# Patient Record
Sex: Male | Born: 1950 | Race: Black or African American | Hispanic: No | Marital: Married | Smoking: Never smoker
Health system: Southern US, Community
[De-identification: ages and names within clinical notes are randomized; demographics above are authoritative.]

## PROBLEM LIST (undated history)

## (undated) DIAGNOSIS — I2510 Atherosclerotic heart disease of native coronary artery without angina pectoris: Secondary | ICD-10-CM

## (undated) DIAGNOSIS — J189 Pneumonia, unspecified organism: Secondary | ICD-10-CM

## (undated) DIAGNOSIS — R209 Unspecified disturbances of skin sensation: Secondary | ICD-10-CM

## (undated) DIAGNOSIS — H5461 Unqualified visual loss, right eye, normal vision left eye: Secondary | ICD-10-CM

## (undated) DIAGNOSIS — D126 Benign neoplasm of colon, unspecified: Secondary | ICD-10-CM

## (undated) DIAGNOSIS — G459 Transient cerebral ischemic attack, unspecified: Secondary | ICD-10-CM

## (undated) DIAGNOSIS — M1990 Unspecified osteoarthritis, unspecified site: Secondary | ICD-10-CM

## (undated) DIAGNOSIS — G8929 Other chronic pain: Secondary | ICD-10-CM

## (undated) DIAGNOSIS — K219 Gastro-esophageal reflux disease without esophagitis: Secondary | ICD-10-CM

## (undated) DIAGNOSIS — E785 Hyperlipidemia, unspecified: Secondary | ICD-10-CM

## (undated) DIAGNOSIS — I639 Cerebral infarction, unspecified: Secondary | ICD-10-CM

## (undated) DIAGNOSIS — K5790 Diverticulosis of intestine, part unspecified, without perforation or abscess without bleeding: Secondary | ICD-10-CM

## (undated) DIAGNOSIS — I10 Essential (primary) hypertension: Secondary | ICD-10-CM

## (undated) DIAGNOSIS — E669 Obesity, unspecified: Secondary | ICD-10-CM

## (undated) HISTORY — DX: Cerebral infarction, unspecified: I63.9

## (undated) HISTORY — DX: Benign neoplasm of colon, unspecified: D12.6

## (undated) HISTORY — DX: Unspecified osteoarthritis, unspecified site: M19.90

## (undated) HISTORY — DX: Other chronic pain: G89.29

## (undated) HISTORY — DX: Diverticulosis of intestine, part unspecified, without perforation or abscess without bleeding: K57.90

## (undated) HISTORY — DX: Essential (primary) hypertension: I10

## (undated) HISTORY — PX: OTHER SURGICAL HISTORY: SHX169

## (undated) HISTORY — DX: Gastro-esophageal reflux disease without esophagitis: K21.9

## (undated) HISTORY — DX: Obesity, unspecified: E66.9

## (undated) HISTORY — DX: Hyperlipidemia, unspecified: E78.5

## (undated) HISTORY — PX: CARDIAC CATHETERIZATION: SHX172

## (undated) HISTORY — DX: Unqualified visual loss, right eye, normal vision left eye: H54.61

## (undated) HISTORY — DX: Transient cerebral ischemic attack, unspecified: G45.9

## (undated) HISTORY — DX: Unspecified disturbances of skin sensation: R20.9

---
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
## 1978-01-10 HISTORY — PX: OTHER SURGICAL HISTORY: SHX169

## 1991-11-11 DIAGNOSIS — D126 Benign neoplasm of colon, unspecified: Secondary | ICD-10-CM

## 1991-11-11 HISTORY — DX: Benign neoplasm of colon, unspecified: D12.6

## 1997-06-05 ENCOUNTER — Ambulatory Visit (HOSPITAL_COMMUNITY): Admission: RE | Admit: 1997-06-05 | Discharge: 1997-06-05 | Payer: Self-pay | Admitting: Podiatry

## 2001-08-20 ENCOUNTER — Encounter: Admission: RE | Admit: 2001-08-20 | Discharge: 2001-08-20 | Payer: Self-pay | Admitting: Orthopedic Surgery

## 2001-08-20 ENCOUNTER — Encounter: Payer: Self-pay | Admitting: Orthopedic Surgery

## 2003-03-05 ENCOUNTER — Inpatient Hospital Stay (HOSPITAL_COMMUNITY): Admission: RE | Admit: 2003-03-05 | Discharge: 2003-03-07 | Payer: Self-pay | Admitting: Internal Medicine

## 2004-04-01 ENCOUNTER — Ambulatory Visit: Payer: Self-pay | Admitting: Internal Medicine

## 2004-06-23 ENCOUNTER — Ambulatory Visit: Payer: Self-pay | Admitting: Gastroenterology

## 2004-07-01 ENCOUNTER — Ambulatory Visit: Payer: Self-pay | Admitting: Internal Medicine

## 2005-10-20 ENCOUNTER — Ambulatory Visit: Payer: Self-pay | Admitting: Gastroenterology

## 2005-10-28 ENCOUNTER — Ambulatory Visit: Payer: Self-pay | Admitting: Gastroenterology

## 2006-03-10 ENCOUNTER — Encounter: Admission: RE | Admit: 2006-03-10 | Discharge: 2006-03-10 | Payer: Self-pay | Admitting: Surgery

## 2006-03-18 ENCOUNTER — Encounter: Admission: RE | Admit: 2006-03-18 | Discharge: 2006-03-18 | Payer: Self-pay | Admitting: Surgery

## 2006-10-18 ENCOUNTER — Ambulatory Visit: Payer: Self-pay | Admitting: Gastroenterology

## 2006-10-25 ENCOUNTER — Encounter: Payer: Self-pay | Admitting: Gastroenterology

## 2006-10-25 ENCOUNTER — Ambulatory Visit: Payer: Self-pay | Admitting: Gastroenterology

## 2006-10-25 DIAGNOSIS — K5730 Diverticulosis of large intestine without perforation or abscess without bleeding: Secondary | ICD-10-CM | POA: Insufficient documentation

## 2006-10-25 DIAGNOSIS — K648 Other hemorrhoids: Secondary | ICD-10-CM | POA: Insufficient documentation

## 2007-03-22 DIAGNOSIS — I10 Essential (primary) hypertension: Secondary | ICD-10-CM | POA: Insufficient documentation

## 2007-03-22 DIAGNOSIS — E785 Hyperlipidemia, unspecified: Secondary | ICD-10-CM | POA: Insufficient documentation

## 2007-03-22 DIAGNOSIS — M109 Gout, unspecified: Secondary | ICD-10-CM

## 2007-03-22 DIAGNOSIS — K219 Gastro-esophageal reflux disease without esophagitis: Secondary | ICD-10-CM | POA: Insufficient documentation

## 2007-03-22 DIAGNOSIS — J310 Chronic rhinitis: Secondary | ICD-10-CM | POA: Insufficient documentation

## 2007-03-22 DIAGNOSIS — D126 Benign neoplasm of colon, unspecified: Secondary | ICD-10-CM

## 2007-03-22 DIAGNOSIS — K921 Melena: Secondary | ICD-10-CM | POA: Insufficient documentation

## 2007-03-22 DIAGNOSIS — H409 Unspecified glaucoma: Secondary | ICD-10-CM | POA: Insufficient documentation

## 2007-09-27 ENCOUNTER — Encounter: Payer: Self-pay | Admitting: Gastroenterology

## 2007-10-04 ENCOUNTER — Telehealth: Payer: Self-pay | Admitting: Gastroenterology

## 2007-10-11 ENCOUNTER — Encounter: Payer: Self-pay | Admitting: Gastroenterology

## 2009-01-10 DIAGNOSIS — R569 Unspecified convulsions: Secondary | ICD-10-CM

## 2009-01-10 DIAGNOSIS — I639 Cerebral infarction, unspecified: Secondary | ICD-10-CM

## 2009-01-10 HISTORY — DX: Unspecified convulsions: R56.9

## 2009-01-10 HISTORY — PX: POLYPECTOMY: SHX149

## 2009-01-10 HISTORY — DX: Cerebral infarction, unspecified: I63.9

## 2009-02-03 ENCOUNTER — Inpatient Hospital Stay (HOSPITAL_COMMUNITY): Admission: EM | Admit: 2009-02-03 | Discharge: 2009-02-05 | Payer: Self-pay | Admitting: Emergency Medicine

## 2009-02-03 ENCOUNTER — Ambulatory Visit: Payer: Self-pay | Admitting: Cardiology

## 2009-02-04 ENCOUNTER — Encounter (INDEPENDENT_AMBULATORY_CARE_PROVIDER_SITE_OTHER): Payer: Self-pay | Admitting: Emergency Medicine

## 2009-02-04 ENCOUNTER — Encounter (INDEPENDENT_AMBULATORY_CARE_PROVIDER_SITE_OTHER): Payer: Self-pay | Admitting: Internal Medicine

## 2009-02-04 ENCOUNTER — Ambulatory Visit: Payer: Self-pay | Admitting: Surgery

## 2010-03-29 LAB — BASIC METABOLIC PANEL
BUN: 10 mg/dL (ref 6–23)
Calcium: 9.1 mg/dL (ref 8.4–10.5)
Chloride: 109 mEq/L (ref 96–112)
Creatinine, Ser: 0.95 mg/dL (ref 0.4–1.5)
GFR calc Af Amer: >60 mL/min (ref >60)
GFR calc non Af Amer: >60 mL/min (ref >60)
Glucose, Bld: 152 mg/dL — ABNORMAL HIGH (ref 70–99)
Potassium: 4 mEq/L (ref 3.5–5.1)
Sodium: 142 mEq/L (ref 135–145)

## 2010-03-29 LAB — CK: Total CK: 88 U/L (ref 7–232)

## 2010-03-29 LAB — CBC
HCT: 39.3 % (ref 39.0–52.0)
Hemoglobin: 13.4 g/dL (ref 13.0–17.0)
MCHC: 34 g/dL (ref 30.0–36.0)
Platelets: 193 10*3/uL (ref 150–400)
RBC: 4.68 MIL/uL (ref 4.22–5.81)
RDW: 13.2 % (ref 11.5–15.5)
WBC: 6.9 10*3/uL (ref 4.0–10.5)

## 2010-03-29 LAB — PROTIME-INR
INR: 1.08 (ref 0.00–1.49)
Prothrombin Time: 13.9 seconds (ref 11.6–15.2)

## 2010-03-29 LAB — GLUCOSE, CAPILLARY
Glucose-Capillary: 145 mg/dL — ABNORMAL HIGH (ref 70–99)
Glucose-Capillary: 191 mg/dL — ABNORMAL HIGH (ref 70–99)
Glucose-Capillary: 203 mg/dL — ABNORMAL HIGH (ref 70–99)

## 2010-03-29 LAB — DIFFERENTIAL
Basophils Relative: 1 % (ref 0–1)
Eosinophils Relative: 2 % (ref 0–5)
Lymphs Abs: 2.6 10*3/uL (ref 0.7–4.0)
Monocytes Absolute: 0.5 10*3/uL (ref 0.1–1.0)
Neutro Abs: 3.5 10*3/uL (ref 1.7–7.7)

## 2010-03-29 LAB — COMPREHENSIVE METABOLIC PANEL
ALT: 22 U/L (ref 0–53)
AST: 21 U/L (ref 0–37)
Albumin: 3.9 g/dL (ref 3.5–5.2)
Chloride: 106 mEq/L (ref 96–112)
Potassium: 3.5 mEq/L (ref 3.5–5.1)

## 2010-03-29 LAB — TROPONIN I
Troponin I: 0.01 ng/mL (ref 0.00–0.06)
Troponin I: 0.01 ng/mL (ref 0.00–0.06)
Troponin I: 0.01 ng/mL (ref 0.00–0.06)

## 2010-03-29 LAB — CK TOTAL AND CKMB (NOT AT ARMC)
Relative Index: INVALID (ref 0.0–2.5)
Total CK: 97 U/L (ref 7–232)

## 2010-03-29 LAB — HEMOGLOBIN A1C: Hgb A1c MFr Bld: 8.4 % — ABNORMAL HIGH (ref 4.6–6.1)

## 2010-03-29 LAB — LIPID PANEL
HDL: 31 mg/dL — ABNORMAL LOW (ref >39)
Total CHOL/HDL Ratio: 5.2 RATIO

## 2010-03-29 LAB — ANA: Anti Nuclear Antibody(ANA): NEGATIVE

## 2010-03-29 LAB — RPR: RPR Ser Ql: NONREACTIVE

## 2010-05-25 NOTE — Assessment & Plan Note (Signed)
[facility]                         GASTROENTEROLOGY OFFICE NOTE   SAMMUEL, BLICK                  MRN:          [id number]  DATE:10/18/2006                            DOB:          19-Feb-1950    [name] is a 60 year old African-American male with the history of  GERD and adenomatous colon polyps.  He states his reflux symptoms have  remained under excellent control on Prevacid and antireflux measures.  He has no colorectal complaints and specifically denies any change in  bowel habits, change in stool caliber, melena, or hematochezia.  He  furthermore denies dysphagia, odynophagia, nausea or vomiting.  He is  due for surveillance colonoscopy.   CURRENT MEDICATIONS:  1. Prevacid 30 mg p.o. q.a.m.  2. Timolol 0.5% eye drops as directed.   MEDICATION ALLERGIES:  None known.   EXAM:  Obese, no acute distress.  Weight 265.  Blood pressure 122/78, pulse 72 and regular.  CHEST:  Clear to auscultation bilaterally.  CARDIAC:  Regular rate and rhythm without murmurs.  ABDOMEN:  Soft and nontender with normoactive bowel sounds, no palpable  organomegaly, masses or hernias.   ASSESSMENT AND PLAN:  1. Personal history of adenomatous colon polyps.  Risks, benefits, and      alternatives to colonoscopy, possible biopsy and possible      polypectomy were discussed with the patient.  He consents to      proceed.  This will be scheduled electively.  2. GERD.  Maintain standard antireflux measures and Prevacid 30 mg      p.o. q.a.m.  Endoscopy, performed in October 2007, was negative.  3. Return office visit in one year.     Venita Lick. Russella Dar, MD, [facility]  Electronically Signed    MTS/MedQ  DD: 10/18/2006  DT: 10/18/2006  Job #: [id number]

## 2010-05-28 NOTE — Discharge Summary (Signed)
NAME:  BARAA, TUBBS                        ACCOUNT NO.:  [id number]   MEDICAL RECORD NO.:  [id number]                   PATIENT TYPE:  INP   LOCATION:  0362                                 FACILITY:  [facility]   PHYSICIAN:  Charlaine Dalton. Sherene Sires, M.D. [facility]           DATE OF BIRTH:  09-01-1950   DATE OF ADMISSION:  03/05/2003  DATE OF DISCHARGE:  03/07/2003                                 DISCHARGE SUMMARY   DISCHARGE DIAGNOSES:  1. Gastroenteritis with dehydration.  2. Hypertension.  3. History of gastroesophageal reflux disease.  4. Hypokalemia.   LABORATORY DATA:  CK is 142, CK-MB is 0.6, troponin I is 0.02.  Sodium 138,  potassium 3, chloride 106, CO2 is 27, glucose 153, BUN 17, creatinine 1.3.  WBC is 5.6, hemoglobin 14.2, hematocrit 41.9, platelets are 241.   HOSPITAL COURSE BY DISCHARGE DIAGNOSIS:  Discharge Diagnosis 1.  DEHYDRATION/DIARRHEA AND GASTROENTERITIS.  Patient was admitted to [facility], treated with intravenous fluids and antiemetics and  antidiuretics.  He responded to treatment and is ready for discharge home by  March 07, 2003.   Discharge Diagnosis 2. HYPERTENSION.  His antihypertensives were held while  in the hospital, his blood pressure continued to rise, and he was well  hydrated and started back on his Avalide by discharge.   Discharge Diagnosis 3. GASTROESOPHAGEAL REFLUX DISEASE.  His Prevacid had  been changed to an H2 blocker while in the hospital due to diarrhea and now  since diarrhea has been resolved he is back on Prevacid one a day.   MEDICATIONS:  1. Avalide 150/12.5 one a day.  2. Timoptic eye drops as before.  3. Prevacid 30 mg once a day.   SPECIAL INSTRUCTIONS:  1. Diet:  Drink plenty of fluids, regular diet.  2. Call for any problems.  3. He will follow up with Dr. Sherene Sires on March 18, 2003 at 9:40 a.m.   DISPOSITION AND CONDITION ON DISCHARGE:  Improved.     Brett Canales Minor, A.C.N.P. [facility]                 Charlaine Dalton. Sherene Sires, M.D.  [facility]    SM/MEDQ  D:  03/07/2003  T:  03/07/2003  Job:  [id number]   cc:   Venita Lick. Russella Dar, M.D. [facility]

## 2010-05-28 NOTE — Assessment & Plan Note (Signed)
[facility]                           GASTROENTEROLOGY OFFICE NOTE   Andrew [name], Andrew [name]                  MRN:          [id number]  DATE:10/20/2005                            DOB:          03-27-1950    OFFICE EVALUATION:  This is a return office visit for GERD.  His symptoms have generally been  well controlled on Prevacid daily.  He recently ran out of medication, and  after about 2 weeks his symptoms recurred.  He has had no dysphagia,  odynophagia, nausea, vomiting, abdominal pain, melena or hematochezia.  His  last colonoscopy was in July of 2003.  He has not previously had upper  endoscopy.   MEDICATIONS:  Medications listed on the chart have been reviewed.   MEDICATION ALLERGIES:  None known.   PHYSICAL EXAMINATION:  GENERAL:  In no acute distress.  VITAL SIGNS:  Weight 272 pounds.  Blood pressure is 138/80, pulse 80 and  regular.  HEENT:  Anicteric sclerae.  Oropharynx clear.  CHEST:  Clear to auscultation bilaterally.  CARDIAC:  Regular rate and rhythm without murmurs appreciated.  ABDOMEN:  Soft and nontender with normoactive bowel sounds.  No palpable  organomegaly, masses or hernias.   ASSESSMENT AND PLAN:  1. Chronic gastroesophageal reflux disease.  Rule out Barrett's esophagus.      Renew Prevacid 30 mg p.o. q.a.m. and continue standard antireflux      measures.  Risks, benefits, and alternatives of upper endoscopy with      possible biopsy discussed with the patient, and he consents to proceed.      This will be scheduled electively.  2. Personal history of adenomatous colon polyps.  Recall colonoscopy      recommended for July of 2008.       Venita Lick. Russella Dar, MD, [facility]      MTS/MedQ  DD:  10/20/2005  DT:  10/21/2005  Job #:  [id number]

## 2010-05-28 NOTE — H&P (Signed)
NAME:  Andrew [name], Andrew [name]                        ACCOUNT NO.:  [id number]   MEDICAL RECORD NO.:  [id number]                   PATIENT TYPE:  INP   LOCATION:  0362                                 FACILITY:  [facility]   PHYSICIAN:  Charlaine Dalton. Sherene Sires, M.D. [facility]           DATE OF BIRTH:  1950-11-06   DATE OF ADMISSION:  03/05/2003  DATE OF DISCHARGE:                                HISTORY & PHYSICAL   CHIEF COMPLAINT:  Nausea, vomiting, and diarrhea.   HISTORY:  This is a 60 year old black male with morbid obesity complicated  by hypertension and degenerative arthritis as well as gouty arthritis whom I  have followed for primary care and generally is healthy. However, acutely  this morning he woke up with nausea, vomiting, diarrhea, and feeling achy  all over associated with chills, but  no documented fever.  He called the  office requesting something for vomiting and I had him come in. He looked  acutely ill with tachycardia, but otherwise normal vital signs and I  recommended admission to the hospital. Presently he is not able to keep  anything down including Gatorade. He was able to put some ice chips in his  mouth without vomiting and last vomited about 1:30 today (approximately two  hours before his exam). However, as noted he looks acutely ill. He denies  any chest pain, sore throat, significant headache, specific areas of  myalgias, arthralgia, abdominal pain, or unusual exposure. He did eat at  [facility] last night, a chicken sandwich, that was different from what his wife  ate. There have been no other family members or contacts that have been  sick, although his wife has had the flu. Her symptoms are all upper  respiratory in nature and not GI.   PAST MEDICAL HISTORY:  1. Morbid obesity with target weight of less than 197.  2. Hypertension with negative Cardiolite, June 12, 2002.  3. Clinical GERD previously evaluated by Dr. Russella Dar.  4. Colon polyps with most recent colonoscopy August 09, 2001, significant only     for diverticulosis.  5. Status post arthroscopy of the left knee for degenerative joint disease     in 1986.  6. Status post excision of the right eye following injury in 1961.  7. Glaucoma.  8. Intermittent rhinitis.   MEDICATIONS:  He is maintained on Avalide 150/12.5 mg one daily, Timoptic  eyedrops, and Prevacid 30 mg daily.   SOCIAL HISTORY:  He has never smoked. He denies any significant alcohol use.  He works as a Engineer, drilling.   FAMILY HISTORY:  Positive for diabetes in mother in her 12s and father is  still alive and well. He has no full siblings.   REVIEW OF SYSTEMS:  Taken in detail and essentially negative, except as  noted above.   PHYSICAL EXAMINATION:  GENERAL: This is an acutely appearing black male who  has appears much different than his baseline, leaning  over a trash can, but  not actually vomiting at the time of evaluation.  VITAL SIGNS: His blood pressure is 144/88, pulse rate 121.  HEENT: Slightly dry mucosa. Dentition is intact. Nasal turbinates are  normal. Ear canals are clear bilaterally.  NECK: Supple without cervical adenopathy or tenderness. Carotid upstrokes  are brisk without any bruits. No thyromegaly or tracheal deviation.  LUNGS: Clear bilaterally to auscultation and percussion.  HEART: Regular rate and rhythm without murmurs, rubs, or gallops present.  ABDOMEN: Obese, but otherwise soft and benign with no organomegaly, masses,  or tenderness. Bowel sounds are diminished.  EXTREMITIES: Warm without calf tenderness, clubbing, cyanosis, or edema.  Pedal pulses present bilaterally.  NEUROLOGIC: No focal deficits __________.  SKIN: Warm and dry.  MUSCULOSKELETAL: Unremarkable.   LABORATORY DATA:  Pending at the time of dictation except the EKG was  performed with sinus tachycardia with nonspecific ST-T wave changes.   IMPRESSION/PLAN:  1. This patient appears somewhat toxic and/or dehydrated with what otherwise      would appear to be a viral gastroenteritis versus food poisoning.  I     recommend hospitalization for IV fluid if he cannot keep anything down     and GI evaluation within 24 hours if not improving with simply IV fluid,     plus Phenergan.  2. Hypertension. Will withhold his antihypertensive treatment for now.  3. History of gastroesophageal reflux disease. Will also hold his Prevacid     since this may cause further GI upset and just treat him with IV Pepcid     for now.                                               Charlaine Dalton. Sherene Sires, M.D. [facility]    MBW/MEDQ  D:  03/05/2003  T:  03/05/2003  Job:  [id number]

## 2010-06-22 ENCOUNTER — Encounter: Payer: Self-pay | Admitting: Internal Medicine

## 2010-08-16 ENCOUNTER — Other Ambulatory Visit: Payer: Self-pay | Admitting: Neurosurgery

## 2010-08-16 DIAGNOSIS — I729 Aneurysm of unspecified site: Secondary | ICD-10-CM

## 2010-08-24 ENCOUNTER — Ambulatory Visit
Admission: RE | Admit: 2010-08-24 | Discharge: 2010-08-24 | Disposition: A | Discharge status: Home / Self Care | Payer: Federal, State, Local not specified - PPO | Source: Ambulatory Visit | Attending: Neurosurgery | Admitting: Neurosurgery

## 2010-08-24 DIAGNOSIS — I729 Aneurysm of unspecified site: Secondary | ICD-10-CM

## 2010-08-24 MED ORDER — GADOBENATE DIMEGLUMINE 529 MG/ML IV SOLN
20.0000 mL | Freq: Once | INTRAVENOUS | Status: AC | PRN
  Administered 2010-08-24: 20 mL via INTRAVENOUS

## 2010-10-14 ENCOUNTER — Encounter: Payer: Self-pay | Admitting: Internal Medicine

## 2010-12-21 ENCOUNTER — Ambulatory Visit (INDEPENDENT_AMBULATORY_CARE_PROVIDER_SITE_OTHER): Payer: Federal, State, Local not specified - PPO | Admitting: Emergency Medicine

## 2010-12-21 DIAGNOSIS — F339 Major depressive disorder, recurrent, unspecified: Secondary | ICD-10-CM

## 2010-12-21 DIAGNOSIS — I6789 Other cerebrovascular disease: Secondary | ICD-10-CM

## 2010-12-21 DIAGNOSIS — I10 Essential (primary) hypertension: Secondary | ICD-10-CM

## 2010-12-22 ENCOUNTER — Encounter: Payer: Self-pay | Admitting: Family Medicine

## 2010-12-22 ENCOUNTER — Encounter: Payer: Self-pay | Admitting: Internal Medicine

## 2010-12-22 DIAGNOSIS — I10 Essential (primary) hypertension: Secondary | ICD-10-CM

## 2010-12-22 DIAGNOSIS — E785 Hyperlipidemia, unspecified: Secondary | ICD-10-CM

## 2010-12-22 DIAGNOSIS — G459 Transient cerebral ischemic attack, unspecified: Secondary | ICD-10-CM

## 2010-12-22 DIAGNOSIS — E119 Type 2 diabetes mellitus without complications: Secondary | ICD-10-CM | POA: Insufficient documentation

## 2011-02-01 ENCOUNTER — Ambulatory Visit (INDEPENDENT_AMBULATORY_CARE_PROVIDER_SITE_OTHER): Payer: Federal, State, Local not specified - PPO | Admitting: Emergency Medicine

## 2011-02-01 DIAGNOSIS — E119 Type 2 diabetes mellitus without complications: Secondary | ICD-10-CM

## 2011-02-01 DIAGNOSIS — E781 Pure hyperglyceridemia: Secondary | ICD-10-CM

## 2011-02-07 ENCOUNTER — Encounter: Payer: Self-pay | Admitting: Internal Medicine

## 2011-02-07 ENCOUNTER — Ambulatory Visit (INDEPENDENT_AMBULATORY_CARE_PROVIDER_SITE_OTHER): Payer: Federal, State, Local not specified - PPO | Admitting: Internal Medicine

## 2011-02-07 DIAGNOSIS — R5381 Other malaise: Secondary | ICD-10-CM

## 2011-02-07 DIAGNOSIS — R634 Abnormal weight loss: Secondary | ICD-10-CM

## 2011-02-07 DIAGNOSIS — I10 Essential (primary) hypertension: Secondary | ICD-10-CM

## 2011-02-07 DIAGNOSIS — R5383 Other fatigue: Secondary | ICD-10-CM | POA: Insufficient documentation

## 2011-02-07 DIAGNOSIS — E785 Hyperlipidemia, unspecified: Secondary | ICD-10-CM

## 2011-02-07 DIAGNOSIS — G459 Transient cerebral ischemic attack, unspecified: Secondary | ICD-10-CM

## 2011-02-07 NOTE — Assessment & Plan Note (Signed)
Patient is often fatigued.  Feels weak.  Activity is limited  History of sleep apnea.   With vasc issues I would recomm a lexiscan myoview to rule out inducible ischemia.

## 2011-02-07 NOTE — Assessment & Plan Note (Signed)
Need to get records from S. Daub.

## 2011-02-07 NOTE — Assessment & Plan Note (Signed)
BP is increased.  He has not taken his meds today.  I would follow.  Has been 140/70 at home.

## 2011-02-07 NOTE — Assessment & Plan Note (Signed)
Will get records from [employer].

## 2011-02-07 NOTE — Assessment & Plan Note (Signed)
Patient gets full easily.  Denies symptoms of gastroparesis.  Note labs from S Daub's office.  (CBC and WESR normal).  Will needto be followed.
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
## 2011-02-07 NOTE — Patient Instructions (Signed)
Your physician has requested that you have a lexiscan myoview. For further information please visit [email or web address]. Please follow instruction sheet, as given. We will call you with results   

## 2011-02-10 ENCOUNTER — Encounter: Payer: Self-pay | Admitting: Internal Medicine

## 2011-02-17 ENCOUNTER — Ambulatory Visit (HOSPITAL_COMMUNITY): Payer: Federal, State, Local not specified - PPO | Attending: Cardiology | Admitting: Radiology

## 2011-02-17 VITALS — BP 177/99 | Ht 72.0 in | Wt 199.0 lb

## 2011-02-17 DIAGNOSIS — E785 Hyperlipidemia, unspecified: Secondary | ICD-10-CM | POA: Insufficient documentation

## 2011-02-17 DIAGNOSIS — Z8673 Personal history of transient ischemic attack (TIA), and cerebral infarction without residual deficits: Secondary | ICD-10-CM | POA: Insufficient documentation

## 2011-02-17 DIAGNOSIS — E119 Type 2 diabetes mellitus without complications: Secondary | ICD-10-CM

## 2011-02-17 DIAGNOSIS — R634 Abnormal weight loss: Secondary | ICD-10-CM | POA: Insufficient documentation

## 2011-02-17 DIAGNOSIS — R5381 Other malaise: Secondary | ICD-10-CM | POA: Insufficient documentation

## 2011-02-17 DIAGNOSIS — I10 Essential (primary) hypertension: Secondary | ICD-10-CM | POA: Insufficient documentation

## 2011-02-17 DIAGNOSIS — G459 Transient cerebral ischemic attack, unspecified: Secondary | ICD-10-CM

## 2011-02-17 DIAGNOSIS — Z8249 Family history of ischemic heart disease and other diseases of the circulatory system: Secondary | ICD-10-CM | POA: Insufficient documentation

## 2011-02-17 DIAGNOSIS — R9431 Abnormal electrocardiogram [ECG] [EKG]: Secondary | ICD-10-CM

## 2011-02-17 MED ORDER — TECHNETIUM TC 99M TETROFOSMIN IV KIT
33.0000 | PACK | Freq: Once | INTRAVENOUS | Status: AC | PRN
  Administered 2011-02-17: 33 via INTRAVENOUS

## 2011-02-17 MED ORDER — REGADENOSON 0.4 MG/5ML IV SOLN
0.4000 mg | Freq: Once | INTRAVENOUS | Status: AC
  Administered 2011-02-17: 0.4 mg via INTRAVENOUS

## 2011-02-17 MED ORDER — TECHNETIUM TC 99M TETROFOSMIN IV KIT
10.8000 | PACK | Freq: Once | INTRAVENOUS | Status: AC | PRN
  Administered 2011-02-17: 11 via INTRAVENOUS

## 2011-02-17 NOTE — Progress Notes (Signed)
[facility] SITE 3 NUCLEAR MED [address] [place] [address] [phone]  Cardiology Nuclear Med Study  Andrew [name] is a 61 y.o. male [id number] 08/27/1950   Nuclear Med Background Indication for Stress Test:  Evaluation for Ischemia History:  '04 XLK:GMWNUU, EF=62%; '11 Echo:EF=60-65% Cardiac Risk Factors: Family History - CAD, Hypertension, Lipids, NIDDM and TIA  Symptoms:  Fatigue and weight loss   Nuclear Pre-Procedure Caffeine/Decaff Intake:  None NPO After: 8:00pm   Lungs:  Clear.  O2 SAT 99% on RA IV 0.9% NS with Angio Cath:  20g  IV Site: R Antecubital x 1, tolerated well IV Started by:  Irean Hong, RN  Chest Size (in):  48 Cup Size: n/a  Height: 6' (1.829 m)  Weight:  199 lb (90.266 kg)  BMI:  Body mass index is 26.99 kg/(m^2). Tech Comments:  Held diabetic meds and coreg this am    Nuclear Med Study 1 or 2 day study: 1 day  Stress Test Type:  Lexiscan  Reading MD: Charlton Haws, MD  Order Authorizing Provider:  Dietrich Pates, MD  Resting Radionuclide: Technetium 57m Tetrofosmin  Resting Radionuclide Dose: 10.8 mCi   Stress Radionuclide:  Technetium 83m Tetrofosmin  Stress Radionuclide Dose: 33.0 mCi           Stress Protocol Rest HR: 62 Stress HR: 101  Rest BP: 177/99 Stress BP: 177/113  Exercise Time (min): n/a METS: n/a   Predicted Max HR: 161 bpm % Max HR: 62.73 bpm Rate Pressure Product: 72536   Dose of Adenosine (mg):  n/a Dose of Lexiscan: 0.4 mg  Dose of Atropine (mg): n/a Dose of Dobutamine: n/a mcg/kg/min (at max HR)  Stress Test Technologist: Smiley Houseman, CMA-N  Nuclear Technologist:  Doyne Keel, CNMT     Rest Procedure:  Myocardial perfusion imaging was performed at rest 45 minutes following the intravenous administration of Technetium 48m Tetrofosmin.  Rest ECG: Nonspecific ST-T wave changes.  Stress Procedure:  The patient received IV Lexiscan 0.4 mg over 15-seconds.  Technetium 73m Tetrofosmin injected  at 30-seconds.  There were more diffuse ST-T wave changes and a hypertensive response with Lexiscan.  Quantitative spect images were obtained after a 45 minute delay.  Stress ECG: No significant change from baseline ECG  QPS Raw Data Images:  Normal; no motion artifact; normal heart/lung ratio. Stress Images:  There is decreased uptake in the inferior wall. Rest Images:  Normal homogeneous uptake in all areas of the myocardium. Subtraction (SDS):  These findings are consistent with ischemia. Transient Ischemic Dilatation (Normal <1.22):  1.03 Lung/Heart Ratio (Normal <0.45):  0.23  Quantitative Gated Spect Images QGS EDV:  121 ml QGS ESV:  48 ml QGS cine images:  NL LV Function; NL Wall Motion QGS EF: 60%  Impression Exercise Capacity:  Lexiscan with no exercise. BP Response:  Normal blood pressure response. Clinical Symptoms:  There is dyspnea. ECG Impression:  No significant ST segment change suggestive of ischemia. Comparison with Prior Nuclear Study: No images to compare  Overall Impression:  Low risk stress nuclear study. Suggestion of small area of mild inferobasal ischemia.  SDS 5      Charlton Haws

## 2011-03-03 ENCOUNTER — Encounter: Payer: Self-pay | Admitting: *Deleted

## 2011-03-03 DIAGNOSIS — M1990 Unspecified osteoarthritis, unspecified site: Secondary | ICD-10-CM | POA: Insufficient documentation

## 2011-03-03 DIAGNOSIS — E785 Hyperlipidemia, unspecified: Secondary | ICD-10-CM | POA: Insufficient documentation

## 2011-03-03 DIAGNOSIS — I10 Essential (primary) hypertension: Secondary | ICD-10-CM | POA: Insufficient documentation

## 2011-03-15 ENCOUNTER — Ambulatory Visit (INDEPENDENT_AMBULATORY_CARE_PROVIDER_SITE_OTHER): Payer: Federal, State, Local not specified - PPO | Admitting: Emergency Medicine

## 2011-03-15 ENCOUNTER — Encounter: Payer: Self-pay | Admitting: Emergency Medicine

## 2011-03-15 DIAGNOSIS — E119 Type 2 diabetes mellitus without complications: Secondary | ICD-10-CM

## 2011-03-15 DIAGNOSIS — G4700 Insomnia, unspecified: Secondary | ICD-10-CM

## 2011-03-15 DIAGNOSIS — R634 Abnormal weight loss: Secondary | ICD-10-CM

## 2011-03-15 DIAGNOSIS — G479 Sleep disorder, unspecified: Secondary | ICD-10-CM

## 2011-03-15 NOTE — Progress Notes (Signed)
  Subjective:    Patient ID: Andrew [name], male    DOB: 29-Apr-1950, 61 y.o.   MRN: [id number]  HPI patient in for recheck. There's been concern over weight loss. He has been back home with his wife. He is doing better. He's tried some muscle no is not been exercising. He is looking forward to getting back to playing golf as some appear    Review of Systems patient describes a fluttering sensation in his upper abdomen and chest. He is recently undergone a cardiac evaluation by Dr. Tenny Craw. His scan was read as low risk however there was a small area of inferior basal decreased flow on his scan.     Objective:   Physical Exam physical exam reveals a patch over the right. His neck is supple chest clear heart regular rate no murmurs rubs or gallops abdomen is soft liver and spleen are not enlarged.        Assessment & Plan:  Patient is a diabetic with recurrent TIAs. He he overall is doing better since she moved back in with his wife. He's been trying to keep his weight where it is with muscle note. His weight is the same as it was last visit his sugar checked was 104. He is having trouble with insomnia so we'll try and have him take his Ambien a tablet if he wakes up in the early morning.

## 2011-03-17 ENCOUNTER — Other Ambulatory Visit: Payer: Self-pay | Admitting: Emergency Medicine

## 2011-04-18 ENCOUNTER — Other Ambulatory Visit: Payer: Self-pay | Admitting: Physician Assistant

## 2011-04-26 ENCOUNTER — Encounter: Payer: Self-pay | Admitting: Emergency Medicine

## 2011-04-26 ENCOUNTER — Ambulatory Visit (INDEPENDENT_AMBULATORY_CARE_PROVIDER_SITE_OTHER): Payer: Federal, State, Local not specified - PPO | Admitting: Emergency Medicine

## 2011-04-26 VITALS — BP 154/89 | HR 64 | Temp 97.1°F | Resp 16 | Ht 71.0 in | Wt 196.0 lb

## 2011-04-26 DIAGNOSIS — G459 Transient cerebral ischemic attack, unspecified: Secondary | ICD-10-CM

## 2011-04-26 DIAGNOSIS — Z8673 Personal history of transient ischemic attack (TIA), and cerebral infarction without residual deficits: Secondary | ICD-10-CM

## 2011-04-26 DIAGNOSIS — I10 Essential (primary) hypertension: Secondary | ICD-10-CM

## 2011-04-26 DIAGNOSIS — R634 Abnormal weight loss: Secondary | ICD-10-CM

## 2011-04-26 NOTE — Progress Notes (Signed)
  Subjective:    Patient ID: Andrew [name], male    DOB: 1950/03/06, 61 y.o.   MRN: [id number]  HPI patient enters to followup on his weight loss. We decided last visit that he would try muscle milk increased activity and see what his weight did. He has lost some muscle mass involving his shoulders but is not lost any weight since his last visit. His weight last visit was 196 and is now 196 today.    Review of Systems he has no new symptoms to report     Objective:   Physical Exam physical exam reveals a patch over his right eye. He has no cranial nerve signs. His chest is clear his heart is regular without murmurs.        Assessment & Plan:  Patient stable with his weight loss at present. Will see again in 3 months but no other testing at the present time. Working out with some weights starting with 5 pound weights he does not have to strain much see Dr Sandria Manly. in June and hopefully what will be released to drive at that time

## 2011-05-17 ENCOUNTER — Other Ambulatory Visit: Payer: Self-pay | Admitting: Physician Assistant

## 2011-05-17 ENCOUNTER — Other Ambulatory Visit: Payer: Self-pay

## 2011-06-17 ENCOUNTER — Other Ambulatory Visit: Payer: Self-pay | Admitting: Physician Assistant

## 2011-06-30 ENCOUNTER — Other Ambulatory Visit: Payer: Self-pay | Admitting: Emergency Medicine

## 2011-07-12 ENCOUNTER — Other Ambulatory Visit: Payer: Self-pay | Admitting: Physician Assistant

## 2011-07-12 ENCOUNTER — Other Ambulatory Visit: Payer: Self-pay | Admitting: Emergency Medicine

## 2011-08-02 ENCOUNTER — Ambulatory Visit (INDEPENDENT_AMBULATORY_CARE_PROVIDER_SITE_OTHER): Payer: Federal, State, Local not specified - PPO | Admitting: Emergency Medicine

## 2011-08-02 ENCOUNTER — Other Ambulatory Visit: Payer: Self-pay | Admitting: Physician Assistant

## 2011-08-02 VITALS — BP 168/94 | HR 63 | Temp 97.9°F | Resp 16 | Ht 71.5 in | Wt 198.0 lb

## 2011-08-02 DIAGNOSIS — E785 Hyperlipidemia, unspecified: Secondary | ICD-10-CM

## 2011-08-02 DIAGNOSIS — I6789 Other cerebrovascular disease: Secondary | ICD-10-CM

## 2011-08-02 DIAGNOSIS — I10 Essential (primary) hypertension: Secondary | ICD-10-CM

## 2011-08-02 DIAGNOSIS — E119 Type 2 diabetes mellitus without complications: Secondary | ICD-10-CM

## 2011-08-02 LAB — COMPREHENSIVE METABOLIC PANEL
AST: 10 U/L (ref 0–37)
Albumin: 4.3 g/dL (ref 3.5–5.2)
Alkaline Phosphatase: 68 U/L (ref 39–117)
BUN: 13 mg/dL (ref 6–23)
Potassium: 4 mEq/L (ref 3.5–5.3)
Sodium: 143 mEq/L (ref 135–145)
Total Protein: 7.1 g/dL (ref 6.0–8.3)

## 2011-08-02 LAB — LIPID PANEL
HDL: 38 mg/dL — ABNORMAL LOW (ref >39)
LDL Cholesterol: 50 mg/dL (ref 0–99)
VLDL: 9 mg/dL (ref 0–40)

## 2011-08-02 LAB — GLUCOSE, POCT (MANUAL RESULT ENTRY): POC Glucose: 140 mg/dl — AB (ref 70–99)

## 2011-08-02 NOTE — Progress Notes (Signed)
  Subjective:    Patient ID: Andrew [name], male    DOB: Nov 21, 1950, 61 y.o.   MRN: [id number]  HPI patient here to followup his diabetes. He has had a stroke which causes some numbness on the left side of his face which has been stable. He sees Dr. Emilio Math on regular basis. He has been released to have a car and drive whenever he desires . He has been taking all of his medications as instructed. Things at home are good. He's been doing some traveling with his family and has been playing golf on a regular basis.    Review of Systems     Objective:   Physical Exam patient has a patch over his right. Left eye exam is normal. Chest is clear heart regular rate no murmurs abdomen is soft nontender extremities are without edema. Repeat blood pressure checked was 130/80  Results for orders placed in visit on 08/02/11  GLUCOSE, POCT (MANUAL RESULT ENTRY)      Component Value Range   POC Glucose 140 (*) 70 - 99 mg/dl  POCT GLYCOSYLATED HEMOGLOBIN (HGB A1C)      Component Value Range   Hemoglobin A1C 5.9          Assessment & Plan:  Patient stable at present. Will not make any medication changes. We'll recheck in 3-4 months

## 2011-08-03 ENCOUNTER — Other Ambulatory Visit: Payer: Self-pay | Admitting: Physician Assistant

## 2011-08-04 ENCOUNTER — Encounter: Payer: Self-pay | Admitting: Emergency Medicine

## 2011-08-09 ENCOUNTER — Other Ambulatory Visit: Payer: Self-pay | Admitting: Physician Assistant

## 2011-08-13 ENCOUNTER — Other Ambulatory Visit: Payer: Self-pay | Admitting: Physician Assistant

## 2011-08-29 ENCOUNTER — Other Ambulatory Visit: Payer: Self-pay | Admitting: Emergency Medicine

## 2011-09-01 ENCOUNTER — Encounter: Payer: Self-pay | Admitting: Gastroenterology

## 2011-09-03 ENCOUNTER — Other Ambulatory Visit: Payer: Self-pay

## 2011-09-03 MED ORDER — CLONIDINE HCL 0.3 MG/24HR TD PTWK: 1.0000 | MEDICATED_PATCH | TRANSDERMAL | Status: DC

## 2011-09-06 ENCOUNTER — Other Ambulatory Visit: Payer: Self-pay | Admitting: Emergency Medicine

## 2011-09-10 ENCOUNTER — Other Ambulatory Visit: Payer: Self-pay | Admitting: Physician Assistant

## 2011-09-11 NOTE — Telephone Encounter (Signed)
Ok x 3 months

## 2011-09-20 NOTE — Progress Notes (Signed)
Completed prior auth over the phone for pt's Ambien 10 mg and received approval through 07/23/12. Faxed approval notice to pharm.

## 2011-09-24 ENCOUNTER — Other Ambulatory Visit: Payer: Self-pay

## 2011-09-24 MED ORDER — CARVEDILOL 25 MG PO TABS: 25.0000 mg | ORAL_TABLET | Freq: Two times a day (BID) | ORAL | Status: DC

## 2011-09-29 ENCOUNTER — Encounter: Payer: Self-pay | Admitting: Gastroenterology

## 2011-10-24 ENCOUNTER — Encounter: Payer: Self-pay | Admitting: Gastroenterology

## 2011-10-24 ENCOUNTER — Ambulatory Visit (INDEPENDENT_AMBULATORY_CARE_PROVIDER_SITE_OTHER): Payer: Federal, State, Local not specified - PPO | Admitting: Gastroenterology

## 2011-10-24 VITALS — BP 172/90 | HR 68 | Ht 71.0 in | Wt 202.2 lb

## 2011-10-24 DIAGNOSIS — Z86010 Personal history of colonic polyps: Secondary | ICD-10-CM

## 2011-10-24 NOTE — Progress Notes (Signed)
History of Present Illness: This is a 61 year old male with a history of adenomatous colon polyps., Initial diagnosis 1993 he did have small adenomatous colon polyps in 2008. He returns for consideration of surveillance colonoscopy. He is maintained on Plavix and aspirin for history of TIAs and is followed by Dr. Fayrene Fearing love. Patient states he has had TIAs every few months and he feels he had a TIA with left-sided facial numbness and left shoulder numbness at the end of September. Denies weight loss, abdominal pain, constipation, diarrhea, change in stool caliber, melena, hematochezia, nausea, vomiting, dysphagia, reflux symptoms, chest pain.  Review of Systems: Pertinent positive and negative review of systems were noted in the above HPI section. All other review of systems were otherwise negative.  Current Medications, Allergies, Past Medical History, Past Surgical History, Family History and Social History were reviewed in [employer] record.  Physical Exam: General: Well developed , well nourished, no acute distress Head: Normocephalic and atraumatic Eyes:  sclerae anicteric, EOMI Ears: Normal auditory acuity Mouth: No deformity or lesions Neck: Supple, no masses or thyromegaly Lungs: Clear throughout to auscultation Heart: Regular rate and rhythm; no murmurs, rubs or bruits Abdomen: Soft, non tender and non distended. No masses, hepatosplenomegaly or hernias noted. Normal Bowel sounds Rectal: deferred to colonoscopy  Musculoskeletal: Symmetrical with no gross deformities  Skin: No lesions on visible extremities Pulses:  Normal pulses noted Extremities: No clubbing, cyanosis, edema or deformities noted Neurological: Alert oriented x 4, grossly nonfocal Cervical Nodes:  No significant cervical adenopathy Inguinal Nodes: No significant inguinal adenopathy Psychological:  Alert and cooperative. Normal mood and affect  Assessment and Recommendations:  1. Personal  history of adenomatous colon polyps. Given his history of recurrent TIAs and possibly a TIA several weeks ago I do not feel it is a safe time to discontinue Plavix, even for a few days. We will therefore postpone consideration of colonoscopy for several months and he will have reevaluation with Dr. Avie Echevaria. Await further evaluation from Dr. Sandria Manly regarding a 5 day hold of Plavix and aspirin. I would presume that he should have a stable neurologic situation for 6-12 months before considering even for short-term hold of Plavix.

## 2011-10-24 NOTE — Patient Instructions (Addendum)
You will be due for a recall colonoscopy in 04/2012. We will send you a reminder in the mail when it gets closer to that time.  cc: Lesle Chris, MD        Avie Echevaria, MD

## 2011-10-26 ENCOUNTER — Other Ambulatory Visit: Payer: Self-pay | Admitting: Physician Assistant

## 2011-11-10 ENCOUNTER — Other Ambulatory Visit: Payer: Self-pay | Admitting: Emergency Medicine

## 2011-11-15 ENCOUNTER — Ambulatory Visit (INDEPENDENT_AMBULATORY_CARE_PROVIDER_SITE_OTHER): Payer: Federal, State, Local not specified - PPO | Admitting: Emergency Medicine

## 2011-11-15 ENCOUNTER — Encounter: Payer: Self-pay | Admitting: Emergency Medicine

## 2011-11-15 VITALS — BP 185/81 | HR 60 | Temp 98.3°F | Resp 16 | Ht 72.0 in | Wt 202.0 lb

## 2011-11-15 DIAGNOSIS — E119 Type 2 diabetes mellitus without complications: Secondary | ICD-10-CM

## 2011-11-15 DIAGNOSIS — G459 Transient cerebral ischemic attack, unspecified: Secondary | ICD-10-CM

## 2011-11-15 DIAGNOSIS — E782 Mixed hyperlipidemia: Secondary | ICD-10-CM

## 2011-11-15 DIAGNOSIS — E785 Hyperlipidemia, unspecified: Secondary | ICD-10-CM

## 2011-11-15 DIAGNOSIS — Z23 Encounter for immunization: Secondary | ICD-10-CM

## 2011-11-15 DIAGNOSIS — I10 Essential (primary) hypertension: Secondary | ICD-10-CM

## 2011-11-15 LAB — POCT GLYCOSYLATED HEMOGLOBIN (HGB A1C): Hemoglobin A1C: 5.7

## 2011-11-15 LAB — LIPID PANEL
LDL Cholesterol: 44 mg/dL (ref 0–99)
Total CHOL/HDL Ratio: 2.7 Ratio
VLDL: 9 mg/dL (ref 0–40)

## 2011-11-15 LAB — COMPREHENSIVE METABOLIC PANEL
ALT: 16 U/L (ref 0–53)
AST: 12 U/L (ref 0–37)
Alkaline Phosphatase: 85 U/L (ref 39–117)
Chloride: 109 mEq/L (ref 96–112)
Creat: 0.9 mg/dL (ref 0.50–1.35)
Total Bilirubin: 0.5 mg/dL (ref 0.3–1.2)

## 2011-11-15 LAB — GLUCOSE, POCT (MANUAL RESULT ENTRY): POC Glucose: 124 mg/dl — AB (ref 70–99)

## 2011-11-15 MED ORDER — CARVEDILOL 25 MG PO TABS: 25.0000 mg | ORAL_TABLET | Freq: Two times a day (BID) | ORAL | Status: DC

## 2011-11-15 MED ORDER — PANTOPRAZOLE SODIUM 40 MG PO TBEC: 40.0000 mg | DELAYED_RELEASE_TABLET | Freq: Every day | ORAL | Status: DC

## 2011-11-15 NOTE — Progress Notes (Signed)
  Subjective:    Patient ID: Andrew [name], male    DOB: August 16, 1950, 61 y.o.   MRN: [id number]  HPI patient is doing well he is back playing golf regular. He continues to have a numb sensation on the left side of his face. He denies any chest pain or shortness of breath. His weight is up 2 pounds so he has stopped  losing weight. Things at home are better not as stressful. He is living with his wife in his relationship with his daughter has improved. He has not had any medications her meds since last night but continues on his blood pressure and cholesterol and diabetes medicines.   Review of Systems     Objective:   Physical Exam alert gentleman who is not in any distress. Neck is supple. Chest is clear to both auscultation and percussion. Abdomen is soft. Liver spleen are not enlarged. Extremities are without edema.  Results for orders placed in visit on 11/15/11  GLUCOSE, POCT (MANUAL RESULT ENTRY)      Component Value Range   POC Glucose 124 (*) 70 - 99 mg/dl  IFOBT (OCCULT BLOOD)      Component Value Range   IFOBT Negative    POCT GLYCOSYLATED HEMOGLOBIN (HGB A1C)      Component Value Range   Hemoglobin A1C 5.7          Assessment & Plan:  Systolic blood pressures up today. It seems to be fairly labile. He's overall doing well. Hemoglobin A1c was 5.7. Blood pressure is not at goal. I have encouraged him to be sure he takes his blood pressure medication regularly and checks his blood pressure at home to be sure we get better control. Recheck in 3-4 months. He was given a flu shot today.Marland Kitchen

## 2011-11-25 ENCOUNTER — Other Ambulatory Visit: Payer: Self-pay | Admitting: Physician Assistant

## 2011-12-08 ENCOUNTER — Ambulatory Visit (INDEPENDENT_AMBULATORY_CARE_PROVIDER_SITE_OTHER): Payer: Federal, State, Local not specified - PPO | Admitting: Emergency Medicine

## 2011-12-08 VITALS — BP 195/97 | HR 74 | Temp 98.2°F | Resp 18 | Ht 72.0 in | Wt 202.4 lb

## 2011-12-08 DIAGNOSIS — R21 Rash and other nonspecific skin eruption: Secondary | ICD-10-CM

## 2011-12-08 DIAGNOSIS — J329 Chronic sinusitis, unspecified: Secondary | ICD-10-CM

## 2011-12-08 MED ORDER — MUPIROCIN 2 % EX OINT: TOPICAL_OINTMENT | CUTANEOUS | Status: DC

## 2011-12-08 MED ORDER — CLOTRIMAZOLE-BETAMETHASONE 1-0.05 % EX CREA: TOPICAL_CREAM | Freq: Two times a day (BID) | CUTANEOUS | Status: DC

## 2011-12-08 MED ORDER — AMOXICILLIN-POT CLAVULANATE 875-125 MG PO TABS: 1.0000 | ORAL_TABLET | Freq: Two times a day (BID) | ORAL | Status: DC

## 2011-12-08 NOTE — Progress Notes (Signed)
  Subjective:    Patient ID: Andrew [name], male    DOB: 11/10/1950, 61 y.o.   MRN: [id number]  HPI patient started feeling bad about 10 days ago. He started with what he thought was an upper respiratory infection. He subsequently has developed a greenish drainage from the nasopharynx. He's had a little bit of a wheeze in his chest but no real cough. He denies fever. He also has developed a irritated rash is present between his buttocks for which she has been using a and D. ointment with minimal success.    Review of Systems     Objective:   Physical Exam patient is alert and cooperative. 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 TMs are clear nose is congested with purulent on the left throat is clear. Chest is clear to auscultation and percussion. Examination of the buttocks reveals some mild redness between the buttocks        Assessment & Plan:  Will cover with Augmentin twice a day saline spray to nose. Mucinex   to take twice a day.    and will give him Lotrisone cream to apply between his buttocks twice a day. His blood pressure was up today and we discussed making sure he takes his blood pressure medications regular.

## 2011-12-08 NOTE — Patient Instructions (Signed)

## 2011-12-09 ENCOUNTER — Other Ambulatory Visit: Payer: Self-pay

## 2011-12-09 ENCOUNTER — Other Ambulatory Visit: Payer: Self-pay | Admitting: Physician Assistant

## 2011-12-09 MED ORDER — CLOPIDOGREL BISULFATE 75 MG PO TABS: 75.0000 mg | ORAL_TABLET | Freq: Every day | ORAL | Status: DC

## 2011-12-09 NOTE — Telephone Encounter (Signed)
Please move this documentation to a telephone note.  Please have the patient contact his cardiologist for his refill of his Plavix. This medication should be coming from them. It does require periodic review to assess if continued therapy is needed and that would be a decision cardiology would need to make not primary care.

## 2011-12-09 NOTE — Telephone Encounter (Signed)
Checked pt's chart and found we had referred him to [facility] in 01/2011. Called [facility] and asked them if they were planning to have pt f/up w/them. They advised me that pt had seen Dr Dietrich Pates and that they will call pt to schedule f/up and that pharm can send RF req to them for Plavix. Called pharmacy and advised them to send RF request to Dr Tenny Craw and notified pt that we have

## 2011-12-09 NOTE — Telephone Encounter (Signed)
Called pt and advised him to call cardiologist to have plavix refilled. Pt stated he was put on this med in 2011 in the hosp and he doesn't have a cardiologist other than the one Dr Cleta Alberts sent him to several months ago for a stress test. I advised pt I will check his chart to see if I can find the cardiologist he saw and call him back w/info. Pt also wants me to check to see if we can refill his Lipitor.

## 2011-12-26 ENCOUNTER — Other Ambulatory Visit: Payer: Self-pay | Admitting: Physician Assistant

## 2011-12-27 ENCOUNTER — Telehealth: Payer: Self-pay | Admitting: *Deleted

## 2011-12-27 ENCOUNTER — Other Ambulatory Visit: Payer: Self-pay | Admitting: Emergency Medicine

## 2011-12-27 MED ORDER — CLONIDINE HCL 0.3 MG/24HR TD PTWK: 1.0000 | MEDICATED_PATCH | TRANSDERMAL | Status: DC

## 2011-12-27 NOTE — Telephone Encounter (Signed)
Called patient to advise Ambien 10 mg prescription was faxed to [facility] @ [address];  Mr Strader's skin patches were escribed

## 2012-01-23 ENCOUNTER — Other Ambulatory Visit: Payer: Self-pay | Admitting: Physician Assistant

## 2012-01-23 ENCOUNTER — Other Ambulatory Visit: Payer: Self-pay | Admitting: Emergency Medicine

## 2012-01-31 ENCOUNTER — Telehealth: Payer: Self-pay

## 2012-01-31 MED ORDER — ATORVASTATIN CALCIUM 10 MG PO TABS: ORAL_TABLET | ORAL | Status: DC

## 2012-01-31 NOTE — Telephone Encounter (Signed)
Called him to advise this was sent in for him. He can not get it filled until Feb. They state it is too soon, he was to take 1/2 but was not aware and states he took one instead of the 1/2 tablet, and used his 1 mo supply too soon. I have called pharmacy to advise 1. sig was incorrect on the new Rx and to cancel and I have resubmitted  2. The pharmacy should be aware patient continued to take 1 tablet in error, when he should have been taking 1/2, they will resubmit to the insurance and if required they will get a quantity limit override form for [place].

## 2012-01-31 NOTE — Telephone Encounter (Signed)
PT TAKES LIPITOR AND WAS TOLD HIS INSURANCE WON'T COVER UNTIL February, HOWEVER HE IS TOTALLY OUT AND MAY NEED TO PAY OUT OF POCKET FOR SOME UNTIL THEN PLEASE CALL PT AT [phone]   [facility] ON [address]

## 2012-01-31 NOTE — Telephone Encounter (Signed)
Patient aware pharmacy is working on this, and so am I .

## 2012-02-08 ENCOUNTER — Other Ambulatory Visit: Payer: Self-pay | Admitting: Internal Medicine

## 2012-02-13 ENCOUNTER — Ambulatory Visit (INDEPENDENT_AMBULATORY_CARE_PROVIDER_SITE_OTHER): Payer: Federal, State, Local not specified - PPO | Admitting: Internal Medicine

## 2012-02-13 ENCOUNTER — Encounter: Payer: Self-pay | Admitting: Internal Medicine

## 2012-02-13 VITALS — BP 152/73 | HR 60 | Ht 72.0 in | Wt 208.0 lb

## 2012-02-13 DIAGNOSIS — I10 Essential (primary) hypertension: Secondary | ICD-10-CM
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
## 2012-02-13 NOTE — Patient Instructions (Addendum)
Your physician wants you to follow-up in: 1 year with Dr. Ross.  You will receive a reminder letter in the mail two months in advance. If you don't receive a letter, please call our office to schedule the follow-up appointment.  

## 2012-02-22 ENCOUNTER — Other Ambulatory Visit: Payer: Self-pay | Admitting: Emergency Medicine

## 2012-02-28 ENCOUNTER — Other Ambulatory Visit: Payer: Self-pay | Admitting: Physician Assistant

## 2012-02-28 ENCOUNTER — Other Ambulatory Visit: Payer: Self-pay | Admitting: *Deleted

## 2012-02-28 MED ORDER — METFORMIN HCL ER 500 MG PO TB24: 500.0000 mg | ORAL_TABLET | Freq: Two times a day (BID) | ORAL | Status: DC

## 2012-02-28 NOTE — Telephone Encounter (Signed)
Error

## 2012-03-06 ENCOUNTER — Encounter: Payer: Self-pay | Admitting: Emergency Medicine

## 2012-03-06 ENCOUNTER — Ambulatory Visit (INDEPENDENT_AMBULATORY_CARE_PROVIDER_SITE_OTHER): Payer: Federal, State, Local not specified - PPO | Admitting: Emergency Medicine

## 2012-03-06 ENCOUNTER — Other Ambulatory Visit: Payer: Self-pay | Admitting: Emergency Medicine

## 2012-03-06 ENCOUNTER — Other Ambulatory Visit: Payer: Self-pay | Admitting: Internal Medicine

## 2012-03-06 VITALS — BP 168/85 | HR 65 | Temp 97.8°F | Resp 16 | Ht 72.0 in | Wt 211.0 lb

## 2012-03-06 DIAGNOSIS — G459 Transient cerebral ischemic attack, unspecified: Secondary | ICD-10-CM

## 2012-03-06 DIAGNOSIS — E119 Type 2 diabetes mellitus without complications: Secondary | ICD-10-CM

## 2012-03-06 DIAGNOSIS — E785 Hyperlipidemia, unspecified: Secondary | ICD-10-CM

## 2012-03-06 DIAGNOSIS — I10 Essential (primary) hypertension: Secondary | ICD-10-CM

## 2012-03-06 DIAGNOSIS — Z23 Encounter for immunization: Secondary | ICD-10-CM

## 2012-03-06 LAB — COMPREHENSIVE METABOLIC PANEL
ALT: 9 U/L (ref 0–53)
BUN: 16 mg/dL (ref 6–23)
CO2: 27 mEq/L (ref 19–32)
Calcium: 9.3 mg/dL (ref 8.4–10.5)
Chloride: 107 mEq/L (ref 96–112)
Creat: 1.06 mg/dL (ref 0.50–1.35)

## 2012-03-06 LAB — CBC WITH DIFFERENTIAL/PLATELET
Eosinophils Absolute: 0.1 10*3/uL (ref 0.0–0.7)
Eosinophils Relative: 3 % (ref 0–5)
Lymphs Abs: 2.2 10*3/uL (ref 0.7–4.0)
MCH: 27.2 pg (ref 26.0–34.0)
MCV: 82.8 fL (ref 78.0–100.0)
Monocytes Absolute: 0.4 10*3/uL (ref 0.1–1.0)
Platelets: 184 10*3/uL (ref 150–400)
RBC: 4.64 MIL/uL (ref 4.22–5.81)

## 2012-03-06 LAB — LIPID PANEL
Cholesterol: 124 mg/dL (ref 0–200)
HDL: 39 mg/dL — ABNORMAL LOW (ref >39)
Total CHOL/HDL Ratio: 3.2 Ratio

## 2012-03-06 MED ORDER — OLMESARTAN MEDOXOMIL-HCTZ 40-12.5 MG PO TABS: 1.0000 | ORAL_TABLET | Freq: Every day | ORAL | Status: DC

## 2012-03-06 NOTE — Progress Notes (Signed)
  Subjective:    Patient ID: Andrew [name], male    DOB: June 21, 1950, 62 y.o.   MRN: [id number]  HPI problem #1 diabetes mellitus. His diabetes has been stable he continues on Glucophage diet and exercise. Problem #2 hypertension. He went to see Dr. Huston Foley and she recommended he change to Benicar HCTZ from plain Benicar. Problem #3 TIA he is a patient of Dr. Andee Lineman. I suggested He change to either Dr. Vickey Huger or Dr. Anne Hahn for care. Problem #4 hyperlipidemia. He continues on diet and statins for this problem.    Review of Systems     Objective:   Physical Exam there is a patch over his right eye. There are no carotid bruits heard. His chest is clear. Her neck exam reveals a regular rate without murmurs rubs or gallops the abdomen is soft without hepatosplenomegaly. Extremities are without edema.  Results for orders placed in visit on 03/06/12  POCT GLYCOSYLATED HEMOGLOBIN (HGB A1C)      Result Value Range   Hemoglobin A1C 6.0          Assessment & Plan:  Patient looks good at present. His blood pressure is 168/85 and will make the changes recommended by Dr. Tenny Craw. Check his hemoglobin A1c today as they have been under 6 . His value today is also 6. His blood pressure medicine was changed to Benicar/HCTZ 40/12.5. He was given a prescription for shingles vaccine. His next checkup will be for physical.

## 2012-03-07 LAB — IRON AND TIBC
%SAT: 26 % (ref 20–55)
TIBC: 310 ug/dL (ref 215–435)

## 2012-04-27 ENCOUNTER — Other Ambulatory Visit: Payer: Self-pay | Admitting: Physician Assistant

## 2012-06-05 ENCOUNTER — Ambulatory Visit (INDEPENDENT_AMBULATORY_CARE_PROVIDER_SITE_OTHER): Payer: Federal, State, Local not specified - PPO | Admitting: Emergency Medicine

## 2012-06-05 VITALS — BP 123/79 | HR 63 | Temp 96.9°F | Resp 16 | Ht 72.0 in | Wt 229.0 lb

## 2012-06-05 DIAGNOSIS — I10 Essential (primary) hypertension: Secondary | ICD-10-CM

## 2012-06-05 DIAGNOSIS — G459 Transient cerebral ischemic attack, unspecified: Secondary | ICD-10-CM

## 2012-06-05 DIAGNOSIS — E785 Hyperlipidemia, unspecified: Secondary | ICD-10-CM

## 2012-06-05 DIAGNOSIS — E119 Type 2 diabetes mellitus without complications: Secondary | ICD-10-CM

## 2012-06-05 DIAGNOSIS — Z0000 Encounter for general adult medical examination without abnormal findings: Secondary | ICD-10-CM

## 2012-06-05 LAB — POCT URINALYSIS DIPSTICK
Ketones, UA: NEGATIVE
Leukocytes, UA: NEGATIVE
Nitrite, UA: NEGATIVE
Protein, UA: NEGATIVE
Urobilinogen, UA: 0.2

## 2012-06-05 LAB — CBC WITH DIFFERENTIAL/PLATELET
Basophils Relative: 0 % (ref 0–1)
Eosinophils Absolute: 0.2 10*3/uL (ref 0.0–0.7)
MCH: 28.2 pg (ref 26.0–34.0)
MCHC: 35.6 g/dL (ref 30.0–36.0)
Neutrophils Relative %: 45 % (ref 43–77)
Platelets: 197 10*3/uL (ref 150–400)
RDW: 13.6 % (ref 11.5–15.5)

## 2012-06-05 LAB — IFOBT (OCCULT BLOOD): IFOBT: NEGATIVE

## 2012-06-05 LAB — GLUCOSE, POCT (MANUAL RESULT ENTRY): POC Glucose: 141 mg/dl — AB (ref 70–99)
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
## 2012-06-05 NOTE — Progress Notes (Deleted)
  Subjective:    Patient ID: Andrew [name], male    DOB: 01-23-1950, 62 y.o.   MRN: [id number]  HPI    Review of Systems     Objective:   Physical Exam        Assessment & Plan:

## 2012-06-06 ENCOUNTER — Telehealth: Payer: Self-pay | Admitting: Emergency Medicine

## 2012-06-06 LAB — COMPREHENSIVE METABOLIC PANEL
ALT: 16 U/L (ref 0–53)
Alkaline Phosphatase: 87 U/L (ref 39–117)
Creat: 0.87 mg/dL (ref 0.50–1.35)
Sodium: 141 mEq/L (ref 135–145)
Total Bilirubin: 0.5 mg/dL (ref 0.3–1.2)
Total Protein: 7.9 g/dL (ref 6.0–8.3)

## 2012-06-06 LAB — LIPID PANEL
LDL Cholesterol: 73 mg/dL (ref 0–99)
Total CHOL/HDL Ratio: 3.6 Ratio
Triglycerides: 148 mg/dL (ref <150)
VLDL: 30 mg/dL (ref 0–40)

## 2012-06-06 NOTE — Telephone Encounter (Signed)
Patient advised.

## 2012-06-06 NOTE — Telephone Encounter (Signed)
Labs are good. No change in meds

## 2012-07-02 ENCOUNTER — Encounter: Payer: Self-pay | Admitting: Neurology

## 2012-07-03 ENCOUNTER — Encounter: Payer: Self-pay | Admitting: Neurology

## 2012-07-03 ENCOUNTER — Ambulatory Visit (INDEPENDENT_AMBULATORY_CARE_PROVIDER_SITE_OTHER): Payer: Federal, State, Local not specified - PPO | Admitting: Neurology

## 2012-07-03 VITALS — BP 151/83 | HR 64 | Temp 98.4°F | Ht 71.5 in | Wt 236.0 lb

## 2012-07-03 DIAGNOSIS — R569 Unspecified convulsions: Secondary | ICD-10-CM

## 2012-07-03 DIAGNOSIS — R209 Unspecified disturbances of skin sensation: Secondary | ICD-10-CM

## 2012-07-03 HISTORY — DX: Unspecified disturbances of skin sensation: R20.9

## 2012-07-03 MED ORDER — CARBAMAZEPINE 200 MG PO TABS: 200.0000 mg | ORAL_TABLET | ORAL | Status: DC

## 2012-07-03 NOTE — Patient Instructions (Signed)
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
## 2012-07-04 ENCOUNTER — Other Ambulatory Visit: Payer: Self-pay | Admitting: Internal Medicine

## 2012-08-02 ENCOUNTER — Other Ambulatory Visit: Payer: Self-pay | Admitting: Internal Medicine

## 2012-08-15 ENCOUNTER — Telehealth: Payer: Self-pay

## 2012-08-16 NOTE — Telephone Encounter (Signed)
PA approved for protonix 40 mg through 08/14/13. Notified pharm.

## 2012-08-20 DIAGNOSIS — Z0271 Encounter for disability determination: Secondary | ICD-10-CM

## 2012-08-27 ENCOUNTER — Ambulatory Visit (INDEPENDENT_AMBULATORY_CARE_PROVIDER_SITE_OTHER): Payer: Federal, State, Local not specified - PPO | Admitting: Family Medicine

## 2012-08-27 VITALS — BP 132/72 | HR 75 | Temp 98.7°F | Resp 18 | Ht 73.5 in | Wt 236.0 lb

## 2012-08-27 DIAGNOSIS — G40909 Epilepsy, unspecified, not intractable, without status epilepticus: Secondary | ICD-10-CM | POA: Insufficient documentation

## 2012-08-27 DIAGNOSIS — J209 Acute bronchitis, unspecified: Secondary | ICD-10-CM

## 2012-08-27 MED ORDER — DOXYCYCLINE HYCLATE 100 MG PO TABS: 100.0000 mg | ORAL_TABLET | Freq: Two times a day (BID) | ORAL | Status: DC

## 2012-08-27 NOTE — Patient Instructions (Addendum)
Use the doxycycline antibiotic for your bronchitis.  Take it twice a day with food and water.  Please let me know if you are not feeling better in the next few days.
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
## 2012-09-20 ENCOUNTER — Other Ambulatory Visit: Payer: Self-pay | Admitting: Physician Assistant

## 2012-10-02 ENCOUNTER — Telehealth: Payer: Self-pay

## 2012-10-02 ENCOUNTER — Ambulatory Visit (INDEPENDENT_AMBULATORY_CARE_PROVIDER_SITE_OTHER): Payer: Federal, State, Local not specified - PPO | Admitting: Emergency Medicine

## 2012-10-02 VITALS — BP 127/80 | HR 63 | Temp 97.5°F | Resp 16 | Ht 72.0 in | Wt 242.0 lb

## 2012-10-02 DIAGNOSIS — R030 Elevated blood-pressure reading, without diagnosis of hypertension: Secondary | ICD-10-CM

## 2012-10-02 DIAGNOSIS — I639 Cerebral infarction, unspecified: Secondary | ICD-10-CM

## 2012-10-02 DIAGNOSIS — Z23 Encounter for immunization: Secondary | ICD-10-CM

## 2012-10-02 DIAGNOSIS — E119 Type 2 diabetes mellitus without complications: Secondary | ICD-10-CM

## 2012-10-02 DIAGNOSIS — I6350 Cerebral infarction due to unspecified occlusion or stenosis of unspecified cerebral artery: Secondary | ICD-10-CM

## 2012-10-02 LAB — POCT GLYCOSYLATED HEMOGLOBIN (HGB A1C): Hemoglobin A1C: 6.9

## 2012-10-02 NOTE — Progress Notes (Signed)
  Subjective:    Patient ID: Andrew [name], male    DOB: [birth year], 62 y.o.   MRN: [id number]  HPI patient in for followup of TIAs. When last seen by Dr. Dyanne Iha she felt he also had a seizure disorder and started medications for this. Patient states he has felt better since that visit. He continues to have intermittent facial numbness. He continues to feel weak at times in his extremities and does have dizziness with changes in position. He overall has been feeling well no specific chest pain or respiratory symptoms. He has been able to play golf. Patient has put on some weight recently he has not been as physically active as previously the    Review of Systems     Objective:   Physical Exam There is a patch over his right. There are no carotid bruits heard. His chest was clear his heart was regular rate no murmurs abdomen soft nontender.  Results for orders placed in visit on 10/02/12  GLUCOSE, POCT (MANUAL RESULT ENTRY)      Result Value Range   POC Glucose 178 (*) 70 - 99 mg/dl  POCT GLYCOSYLATED HEMOGLOBIN (HGB A1C)      Result Value Range   Hemoglobin A1C 6.9         Results for orders placed in visit on 10/02/12  GLUCOSE, POCT (MANUAL RESULT ENTRY)      Result Value Range   POC Glucose 178 (*) 70 - 99 mg/dl  POCT GLYCOSYLATED HEMOGLOBIN (HGB A1C)      Result Value Range   Hemoglobin A1C 6.9     Assessment & Plan:  Diabetes is not under as good control as previous. I do think that is related to his weight gain. I do not feel medication change was indicated. He is to continue his current medications plan recheck in 3-4 months. Disability papers were also signed

## 2012-10-02 NOTE — Telephone Encounter (Signed)
[employer] Disability paperwork is signed by Dr. Cleta Alberts and in Maudia's box to be completed.

## 2012-10-24 ENCOUNTER — Telehealth: Payer: Self-pay | Admitting: Radiology

## 2012-10-24 NOTE — Telephone Encounter (Signed)
I have gotten notice from you to call patient about his Carvedilol 25 mg he has not filled it, per [employer], but upon review I think this may have been d/c because of dizziness,but I can not find any documentation, please advise.

## 2012-10-25 MED ORDER — METFORMIN HCL ER 500 MG PO TB24: 1000.0000 mg | ORAL_TABLET | Freq: Every day | ORAL | Status: DC

## 2012-10-25 MED ORDER — CARVEDILOL 25 MG PO TABS: 25.0000 mg | ORAL_TABLET | Freq: Two times a day (BID) | ORAL | Status: DC

## 2012-10-25 NOTE — Telephone Encounter (Signed)
Andrew [name] and just asked him if he is currently taking the carvedilol. His blood pressure is at goal at the present time and whatever medications he is on currently is what he needs to stay on

## 2012-10-25 NOTE — Telephone Encounter (Signed)
Called him he was going to get refilled today, he has ran out. Advised him it is sent in and he needs to be taking it. Also sent Metformin, it ran out also. To you FYI

## 2012-11-12 ENCOUNTER — Other Ambulatory Visit: Payer: Self-pay | Admitting: Emergency Medicine

## 2012-12-02 ENCOUNTER — Other Ambulatory Visit: Payer: Self-pay | Admitting: Emergency Medicine

## 2012-12-02 ENCOUNTER — Other Ambulatory Visit: Payer: Self-pay | Admitting: Internal Medicine

## 2012-12-03 ENCOUNTER — Other Ambulatory Visit: Payer: Self-pay | Admitting: Internal Medicine

## 2012-12-05 ENCOUNTER — Other Ambulatory Visit: Payer: Self-pay | Admitting: Emergency Medicine

## 2013-01-15 ENCOUNTER — Encounter: Payer: Self-pay | Admitting: Emergency Medicine

## 2013-01-15 ENCOUNTER — Ambulatory Visit (INDEPENDENT_AMBULATORY_CARE_PROVIDER_SITE_OTHER): Payer: Federal, State, Local not specified - PPO | Admitting: Emergency Medicine

## 2013-01-15 VITALS — BP 130/86 | HR 69 | Temp 98.3°F | Resp 16 | Ht 71.5 in | Wt 245.8 lb

## 2013-01-15 DIAGNOSIS — E119 Type 2 diabetes mellitus without complications: Secondary | ICD-10-CM

## 2013-01-15 DIAGNOSIS — E785 Hyperlipidemia, unspecified: Secondary | ICD-10-CM

## 2013-01-15 LAB — CBC WITH DIFFERENTIAL/PLATELET
BASOS PCT: 1 % (ref 0–1)
Basophils Absolute: 0.1 10*3/uL (ref 0.0–0.1)
EOS ABS: 0.2 10*3/uL (ref 0.0–0.7)
Eosinophils Relative: 3 % (ref 0–5)
HCT: 37.5 % — ABNORMAL LOW (ref 39.0–52.0)
Hemoglobin: 12.5 g/dL — ABNORMAL LOW (ref 13.0–17.0)
Lymphocytes Relative: 33 % (ref 12–46)
Lymphs Abs: 1.7 10*3/uL (ref 0.7–4.0)
MCH: 27.4 pg (ref 26.0–34.0)
MCHC: 33.3 g/dL (ref 30.0–36.0)
MCV: 82.2 fL (ref 78.0–100.0)
Monocytes Absolute: 0.5 10*3/uL (ref 0.1–1.0)
Monocytes Relative: 10 % (ref 3–12)
NEUTROS PCT: 53 % (ref 43–77)
Neutro Abs: 2.8 10*3/uL (ref 1.7–7.7)
Platelets: 198 10*3/uL (ref 150–400)
RBC: 4.56 MIL/uL (ref 4.22–5.81)
RDW: 13.2 % (ref 11.5–15.5)
WBC: 5.3 10*3/uL (ref 4.0–10.5)

## 2013-01-15 LAB — GLUCOSE, POCT (MANUAL RESULT ENTRY): POC Glucose: 301 mg/dl — AB (ref 70–99)

## 2013-01-15 LAB — POCT GLYCOSYLATED HEMOGLOBIN (HGB A1C): HEMOGLOBIN A1C: 8

## 2013-01-15 NOTE — Progress Notes (Signed)
   Subjective:    Patient ID: Andrew [name], male    DOB: [birth year], 63 y.o.   MRN: [id number]  HPI patient had a followup on his diabetes. He was down in [place] with family and developed a respiratory illness 2 he was treated with Zithromax and this seems to be resolving. Her all he feels well has no complaints today. He denies chest pain shortness of breath. He denies any swelling or trouble with his lower extremities    Review of Systems     Objective:   Physical Exam there is a patch over the right. Patient is alert and cooperative. There are no carotid bruits chest is clear to auscultation percussion. Heart regular rate no murmurs rubs or gallops. Abdomen soft no tenderness or masses. Extremities without cyanosis or edema  Results for orders placed in visit on 01/15/13  POCT GLYCOSYLATED HEMOGLOBIN (HGB A1C)      Result Value Range   Hemoglobin A1C 8.0    GLUCOSE, POCT (MANUAL RESULT ENTRY)      Result Value Range   POC Glucose 301 (*) 70 - 99 mg/dl       Assessment & Plan:  I am not going to change his medications at the present time. He is encouraged to work on his weight get back on his diet to exercise more and will recheck in 3 months.

## 2013-01-16 LAB — LIPID PANEL
Cholesterol: 166 mg/dL (ref 0–200)
HDL: 40 mg/dL (ref >39)
LDL CALC: 96 mg/dL (ref 0–99)
Total CHOL/HDL Ratio: 4.2 Ratio
Triglycerides: 149 mg/dL (ref <150)
VLDL: 30 mg/dL (ref 0–40)

## 2013-01-16 LAB — COMPLETE METABOLIC PANEL WITH GFR
ALBUMIN: 4.2 g/dL (ref 3.5–5.2)
ALK PHOS: 108 U/L (ref 39–117)
ALT: 21 U/L (ref 0–53)
AST: 18 U/L (ref 0–37)
BILIRUBIN TOTAL: 0.3 mg/dL (ref 0.3–1.2)
BUN: 11 mg/dL (ref 6–23)
CO2: 26 mEq/L (ref 19–32)
Calcium: 8.8 mg/dL (ref 8.4–10.5)
Chloride: 104 mEq/L (ref 96–112)
Creat: 0.86 mg/dL (ref 0.50–1.35)
GFR, Est African American: >89 mL/min
GFR, Est Non African American: >89 mL/min
Glucose, Bld: 285 mg/dL — ABNORMAL HIGH (ref 70–99)
POTASSIUM: 3.7 meq/L (ref 3.5–5.3)
Sodium: 142 mEq/L (ref 135–145)
TOTAL PROTEIN: 7.6 g/dL (ref 6.0–8.3)

## 2013-02-04 ENCOUNTER — Other Ambulatory Visit: Payer: Self-pay | Admitting: Internal Medicine

## 2013-02-15 ENCOUNTER — Ambulatory Visit (INDEPENDENT_AMBULATORY_CARE_PROVIDER_SITE_OTHER): Payer: Federal, State, Local not specified - PPO | Admitting: Internal Medicine

## 2013-02-15 ENCOUNTER — Encounter: Payer: Self-pay | Admitting: Internal Medicine

## 2013-02-15 VITALS — BP 156/75 | HR 62 | Ht 71.5 in | Wt 244.0 lb

## 2013-02-15 DIAGNOSIS — I10 Essential (primary) hypertension: Secondary | ICD-10-CM
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
## 2013-02-15 NOTE — Patient Instructions (Signed)
Your physician wants you to follow-up in: ONE YEAR WITH DR ROSS You will receive a reminder letter in the mail two months in advance. If you don't receive a letter, please call our office to schedule the follow-up appointment.  

## 2013-03-01 ENCOUNTER — Other Ambulatory Visit: Payer: Self-pay | Admitting: Emergency Medicine

## 2013-03-02 ENCOUNTER — Encounter: Payer: Self-pay | Admitting: Emergency Medicine

## 2013-03-02 ENCOUNTER — Ambulatory Visit: Payer: Federal, State, Local not specified - PPO

## 2013-03-02 ENCOUNTER — Ambulatory Visit: Payer: Federal, State, Local not specified - PPO | Admitting: Emergency Medicine

## 2013-03-02 VITALS — BP 140/80 | HR 63 | Temp 98.0°F | Resp 16 | Ht 72.0 in | Wt 247.0 lb

## 2013-03-02 DIAGNOSIS — M25529 Pain in unspecified elbow: Secondary | ICD-10-CM

## 2013-03-02 DIAGNOSIS — M109 Gout, unspecified: Secondary | ICD-10-CM

## 2013-03-02 DIAGNOSIS — M25569 Pain in unspecified knee: Secondary | ICD-10-CM

## 2013-03-02 LAB — POCT CBC
GRANULOCYTE PERCENT: 68.1 % (ref 37–80)
HCT, POC: 40.3 % — AB (ref 43.5–53.7)
Hemoglobin: 12.8 g/dL — AB (ref 14.1–18.1)
Lymph, poc: 1.7 (ref 0.6–3.4)
MCH, POC: 28.2 pg (ref 27–31.2)
MCHC: 31.8 g/dL (ref 31.8–35.4)
MCV: 88.7 fL (ref 80–97)
MID (CBC): 0.6 (ref 0–0.9)
MPV: 8.6 fL (ref 0–99.8)
PLATELET COUNT, POC: 188 10*3/uL (ref 142–424)
POC Granulocyte: 5 (ref 2–6.9)
POC LYMPH %: 23.6 % (ref 10–50)
POC MID %: 8.3 %M (ref 0–12)
RBC: 4.54 M/uL — AB (ref 4.69–6.13)
RDW, POC: 13.2 %
WBC: 7.3 10*3/uL (ref 4.6–10.2)

## 2013-03-02 LAB — POCT SEDIMENTATION RATE: POCT SED RATE: 30 mm/h — AB (ref 0–22)

## 2013-03-02 LAB — URIC ACID: Uric Acid, Serum: 8.5 mg/dL — ABNORMAL HIGH (ref 4.0–7.8)

## 2013-03-02 LAB — GLUCOSE, POCT (MANUAL RESULT ENTRY): POC GLUCOSE: 167 mg/dL — AB (ref 70–99)

## 2013-03-02 LAB — RHEUMATOID FACTOR: Rhuematoid fact SerPl-aCnc: <10 IU/mL (ref <=14)

## 2013-03-02 MED ORDER — PREDNISONE 10 MG PO TABS: ORAL_TABLET | ORAL | Status: DC
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
## 2013-03-02 NOTE — Patient Instructions (Signed)

## 2013-03-04 LAB — ANA: Anti Nuclear Antibody(ANA): NEGATIVE

## 2013-03-05 ENCOUNTER — Telehealth: Payer: Self-pay | Admitting: Radiology

## 2013-03-05 MED ORDER — TRAMADOL HCL 50 MG PO TABS: 50.0000 mg | ORAL_TABLET | Freq: Four times a day (QID) | ORAL | Status: DC | PRN

## 2013-03-05 NOTE — Telephone Encounter (Signed)
Called patient and he has not taken any thing in the past for pain, other than hydrocodone and this caused nausea. Discussed with Dr Everlene Farrier, and sent in tramadol for him.

## 2013-03-27 ENCOUNTER — Other Ambulatory Visit: Payer: Self-pay | Admitting: Emergency Medicine

## 2013-04-12 ENCOUNTER — Other Ambulatory Visit: Payer: Self-pay | Admitting: Emergency Medicine

## 2013-04-12 NOTE — Telephone Encounter (Signed)
Dr Everlene Farrier, you saw pt in Jan, but don't see discussion about this med. Can we RF?

## 2013-04-13 ENCOUNTER — Other Ambulatory Visit: Payer: Self-pay | Admitting: Emergency Medicine

## 2013-04-23 ENCOUNTER — Ambulatory Visit (INDEPENDENT_AMBULATORY_CARE_PROVIDER_SITE_OTHER): Payer: Federal, State, Local not specified - PPO | Admitting: Emergency Medicine

## 2013-04-23 ENCOUNTER — Encounter: Payer: Self-pay | Admitting: Emergency Medicine

## 2013-04-23 VITALS — BP 191/92 | HR 75 | Temp 98.0°F | Resp 16 | Ht 71.5 in | Wt 245.0 lb

## 2013-04-23 DIAGNOSIS — E119 Type 2 diabetes mellitus without complications: Secondary | ICD-10-CM

## 2013-04-23 DIAGNOSIS — G459 Transient cerebral ischemic attack, unspecified: Secondary | ICD-10-CM

## 2013-04-23 LAB — POCT GLYCOSYLATED HEMOGLOBIN (HGB A1C): Hemoglobin A1C: 7.7

## 2013-04-23 LAB — GLUCOSE, POCT (MANUAL RESULT ENTRY): POC Glucose: 173 mg/dl — AB (ref 70–99)
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
## 2013-05-03 ENCOUNTER — Other Ambulatory Visit: Payer: Self-pay | Admitting: Emergency Medicine

## 2013-05-03 ENCOUNTER — Other Ambulatory Visit: Payer: Self-pay | Admitting: Internal Medicine

## 2013-06-10 ENCOUNTER — Ambulatory Visit: Payer: Federal, State, Local not specified - PPO

## 2013-06-10 ENCOUNTER — Ambulatory Visit (INDEPENDENT_AMBULATORY_CARE_PROVIDER_SITE_OTHER): Payer: Federal, State, Local not specified - PPO | Admitting: Emergency Medicine

## 2013-06-10 VITALS — BP 142/90 | HR 77 | Temp 99.1°F | Resp 18 | Ht 72.0 in | Wt 246.2 lb

## 2013-06-10 DIAGNOSIS — J209 Acute bronchitis, unspecified: Secondary | ICD-10-CM

## 2013-06-10 DIAGNOSIS — J018 Other acute sinusitis: Secondary | ICD-10-CM

## 2013-06-10 DIAGNOSIS — H1030 Unspecified acute conjunctivitis, unspecified eye: Secondary | ICD-10-CM

## 2013-06-10 MED ORDER — TOBRAMYCIN 0.3 % OP SOLN: 1.0000 [drp] | OPHTHALMIC | Status: DC

## 2013-06-10 MED ORDER — AMOXICILLIN-POT CLAVULANATE 875-125 MG PO TABS: 1.0000 | ORAL_TABLET | Freq: Two times a day (BID) | ORAL | Status: DC

## 2013-06-10 MED ORDER — PROMETHAZINE-CODEINE 6.25-10 MG/5ML PO SYRP: 5.0000 mL | ORAL_SOLUTION | Freq: Four times a day (QID) | ORAL | Status: DC | PRN

## 2013-06-10 MED ORDER — PSEUDOEPHEDRINE-GUAIFENESIN ER 60-600 MG PO TB12: 1.0000 | ORAL_TABLET | Freq: Two times a day (BID) | ORAL | Status: DC

## 2013-06-10 NOTE — Patient Instructions (Signed)
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
## 2013-06-23 ENCOUNTER — Other Ambulatory Visit: Payer: Self-pay | Admitting: Physician Assistant

## 2013-07-03 ENCOUNTER — Encounter: Payer: Self-pay | Admitting: Neurology

## 2013-07-03 ENCOUNTER — Encounter (INDEPENDENT_AMBULATORY_CARE_PROVIDER_SITE_OTHER): Payer: Self-pay

## 2013-07-03 ENCOUNTER — Ambulatory Visit (INDEPENDENT_AMBULATORY_CARE_PROVIDER_SITE_OTHER): Payer: Federal, State, Local not specified - PPO | Admitting: Neurology

## 2013-07-03 VITALS — BP 186/102 | HR 64 | Resp 15 | Ht 73.0 in | Wt 246.0 lb

## 2013-07-03 DIAGNOSIS — R6889 Other general symptoms and signs: Secondary | ICD-10-CM

## 2013-07-03 DIAGNOSIS — R569 Unspecified convulsions: Secondary | ICD-10-CM

## 2013-07-03 DIAGNOSIS — IMO0001 Reserved for inherently not codable concepts without codable children: Secondary | ICD-10-CM

## 2013-07-03 DIAGNOSIS — R209 Unspecified disturbances of skin sensation: Secondary | ICD-10-CM

## 2013-07-03 MED ORDER — CARBAMAZEPINE 200 MG PO TABS: 200.0000 mg | ORAL_TABLET | ORAL | Status: DC
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
## 2013-07-03 NOTE — Patient Instructions (Signed)

## 2013-07-15 ENCOUNTER — Other Ambulatory Visit: Payer: Self-pay | Admitting: Emergency Medicine

## 2013-08-01 ENCOUNTER — Other Ambulatory Visit: Payer: Self-pay | Admitting: Emergency Medicine

## 2013-08-06 ENCOUNTER — Ambulatory Visit (INDEPENDENT_AMBULATORY_CARE_PROVIDER_SITE_OTHER): Payer: Federal, State, Local not specified - PPO | Admitting: Emergency Medicine

## 2013-08-06 ENCOUNTER — Other Ambulatory Visit: Payer: Self-pay | Admitting: Neurology

## 2013-08-06 VITALS — BP 166/86 | HR 67 | Temp 98.3°F | Resp 16 | Ht 71.5 in | Wt 240.0 lb

## 2013-08-06 DIAGNOSIS — R569 Unspecified convulsions: Secondary | ICD-10-CM

## 2013-08-06 DIAGNOSIS — R6889 Other general symptoms and signs: Secondary | ICD-10-CM

## 2013-08-06 DIAGNOSIS — IMO0001 Reserved for inherently not codable concepts without codable children: Secondary | ICD-10-CM

## 2013-08-06 DIAGNOSIS — K219 Gastro-esophageal reflux disease without esophagitis: Secondary | ICD-10-CM

## 2013-08-06 DIAGNOSIS — E119 Type 2 diabetes mellitus without complications: Secondary | ICD-10-CM

## 2013-08-06 DIAGNOSIS — R209 Unspecified disturbances of skin sensation: Secondary | ICD-10-CM

## 2013-08-06 LAB — GLUCOSE, POCT (MANUAL RESULT ENTRY): POC Glucose: 151 mg/dl — AB (ref 70–99)

## 2013-08-06 LAB — POCT GLYCOSYLATED HEMOGLOBIN (HGB A1C): Hemoglobin A1C: 6.8

## 2013-08-06 MED ORDER — ATORVASTATIN CALCIUM 10 MG PO TABS: ORAL_TABLET | ORAL | Status: DC

## 2013-08-06 MED ORDER — PANTOPRAZOLE SODIUM 40 MG PO TBEC: DELAYED_RELEASE_TABLET | ORAL | Status: DC
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
## 2013-08-23 ENCOUNTER — Other Ambulatory Visit: Payer: Self-pay | Admitting: Emergency Medicine

## 2013-09-02 ENCOUNTER — Other Ambulatory Visit: Payer: Self-pay | Admitting: Emergency Medicine

## 2013-10-09 ENCOUNTER — Other Ambulatory Visit: Payer: Self-pay | Admitting: Emergency Medicine

## 2013-10-22 ENCOUNTER — Ambulatory Visit: Payer: Federal, State, Local not specified - PPO | Admitting: Emergency Medicine

## 2013-10-29 ENCOUNTER — Other Ambulatory Visit: Payer: Self-pay | Admitting: Emergency Medicine

## 2013-10-29 ENCOUNTER — Other Ambulatory Visit: Payer: Self-pay | Admitting: Internal Medicine

## 2013-10-31 ENCOUNTER — Ambulatory Visit (INDEPENDENT_AMBULATORY_CARE_PROVIDER_SITE_OTHER): Payer: Federal, State, Local not specified - PPO | Admitting: Emergency Medicine

## 2013-10-31 ENCOUNTER — Ambulatory Visit (INDEPENDENT_AMBULATORY_CARE_PROVIDER_SITE_OTHER): Payer: Federal, State, Local not specified - PPO

## 2013-10-31 VITALS — BP 142/90 | HR 71 | Temp 98.1°F | Resp 16 | Ht 71.75 in | Wt 245.0 lb

## 2013-10-31 DIAGNOSIS — M79674 Pain in right toe(s): Secondary | ICD-10-CM

## 2013-10-31 MED ORDER — PREDNISONE 10 MG PO TABS: ORAL_TABLET | ORAL | Status: DC

## 2013-10-31 NOTE — Progress Notes (Addendum)
   Subjective:    Patient ID: Andrew [name], male    DOB: 1950/09/13, 63 y.o.   MRN: [id number] This chart was scribed for [name], MD by Marti Sleigh, Medical Scribe. This patient was seen in Room 4 and the patient's care was started at 11:01 AM.  HPI HPI Comments: Andrew [name] is a 63 y.o. male with a hx of HTN, TIA, DM and Gout who presents to [facility] complaining of right toe pain that started yesterday morning. Pt states he has had a gout diagnosis in the past, and states he is not sure what he was prescribed. Pt states his A1C has been running 5.9. Pt denies injury to toe. Pt states he took 4 ibuprofen over the course of a day without relief.     Review of Systems  Constitutional: Negative for fever and chills.  HENT: Negative for ear pain and rhinorrhea.   Musculoskeletal:       Toe pain.  Skin: Negative for color change.  Psychiatric/Behavioral: Negative for agitation.       Objective:   Physical Exam  Nursing note and vitals reviewed. Constitutional: He is oriented to person, place, and time. He appears well-developed and well-nourished.  HENT:  Head: Normocephalic and atraumatic.  Eyes: Pupils are equal, round, and reactive to light.  Neck: No JVD present.  Cardiovascular: Normal rate and regular rhythm.   Pulmonary/Chest: Effort normal and breath sounds normal. No respiratory distress.  Neurological: He is alert and oriented to person, place, and time.  Skin: Skin is warm and dry.  Psychiatric: He has a normal mood and affect. His behavior is normal.  [facility] reading (PRIMARY) by  Dr. Everlene Farrier patient has gouty arthritic changes at the first MTP joint     Assessment & Plan:  We'll treat with low-dose prednisone over 12 day taper. No blood work was done. He does have a history of gout.. he is a  diabetic but his sugars have been under good control.I personally performed the services described in this documentation, which was scribed in my presence. The recorded  information has been reviewed and is accurate.

## 2013-10-31 NOTE — Patient Instructions (Signed)

## 2013-11-04 ENCOUNTER — Ambulatory Visit (INDEPENDENT_AMBULATORY_CARE_PROVIDER_SITE_OTHER): Payer: Federal, State, Local not specified - PPO

## 2013-11-04 ENCOUNTER — Ambulatory Visit (INDEPENDENT_AMBULATORY_CARE_PROVIDER_SITE_OTHER): Payer: Federal, State, Local not specified - PPO | Admitting: Emergency Medicine

## 2013-11-04 ENCOUNTER — Encounter: Payer: Self-pay | Admitting: Emergency Medicine

## 2013-11-04 VITALS — BP 205/102 | HR 58 | Temp 98.7°F | Resp 16 | Ht 72.0 in | Wt 245.8 lb

## 2013-11-04 DIAGNOSIS — R059 Cough, unspecified: Secondary | ICD-10-CM

## 2013-11-04 DIAGNOSIS — R062 Wheezing: Secondary | ICD-10-CM

## 2013-11-04 DIAGNOSIS — Z23 Encounter for immunization: Secondary | ICD-10-CM

## 2013-11-04 DIAGNOSIS — R05 Cough: Secondary | ICD-10-CM

## 2013-11-04 DIAGNOSIS — E119 Type 2 diabetes mellitus without complications: Secondary | ICD-10-CM

## 2013-11-04 DIAGNOSIS — E785 Hyperlipidemia, unspecified: Secondary | ICD-10-CM

## 2013-11-04 LAB — LIPID PANEL
CHOL/HDL RATIO: 3.8 ratio
Cholesterol: 161 mg/dL (ref 0–200)
HDL: 42 mg/dL (ref >39)
LDL Cholesterol: 88 mg/dL (ref 0–99)
Triglycerides: 155 mg/dL — ABNORMAL HIGH (ref <150)
VLDL: 31 mg/dL (ref 0–40)

## 2013-11-04 LAB — POCT GLYCOSYLATED HEMOGLOBIN (HGB A1C): Hemoglobin A1C: 7.1

## 2013-11-04 LAB — COMPLETE METABOLIC PANEL WITH GFR
ALK PHOS: 86 U/L (ref 39–117)
ALT: 23 U/L (ref 0–53)
AST: 20 U/L (ref 0–37)
Albumin: 4.6 g/dL (ref 3.5–5.2)
BILIRUBIN TOTAL: 0.4 mg/dL (ref 0.2–1.2)
BUN: 18 mg/dL (ref 6–23)
CO2: 24 mEq/L (ref 19–32)
Calcium: 9.3 mg/dL (ref 8.4–10.5)
Chloride: 107 mEq/L (ref 96–112)
Creat: 0.93 mg/dL (ref 0.50–1.35)
GFR, Est African American: >89 mL/min
GFR, Est Non African American: 88 mL/min
Glucose, Bld: 223 mg/dL — ABNORMAL HIGH (ref 70–99)
POTASSIUM: 3.7 meq/L (ref 3.5–5.3)
Sodium: 140 mEq/L (ref 135–145)
Total Protein: 7.9 g/dL (ref 6.0–8.3)

## 2013-11-04 LAB — MICROALBUMIN, URINE: MICROALB UR: 24.8 mg/dL — AB (ref <2.0)

## 2013-11-04 LAB — GLUCOSE, POCT (MANUAL RESULT ENTRY): POC Glucose: 231 mg/dl — AB (ref 70–99)
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
## 2013-11-04 NOTE — Patient Instructions (Signed)
Influenza Vaccine (Flu Vaccine, Inactivated or Recombinant) 2014-2015: What You Need to Know 1. Why get vaccinated? Influenza ("flu") is a contagious disease that spreads around the [place] every winter, usually between October and May. Flu is caused by influenza viruses, and is spread mainly by coughing, sneezing, and close contact. Anyone can get flu, but the risk of getting flu is highest among children. Symptoms come on suddenly and may last several days. They can include:  fever/chills  sore throat  muscle aches  fatigue  cough  headache  runny or stuffy nose Flu can make some people much sicker than others. These people include young children, people 65 and older, pregnant women, and people with certain health conditions-such as heart, lung or kidney disease, nervous system disorders, or a weakened immune system. Flu vaccination is especially important for these people, and anyone in close contact with them. Flu can also lead to pneumonia, and make existing medical conditions worse. It can cause diarrhea and seizures in children. Each year thousands of people in the [place] die from flu, and many more are hospitalized. Flu vaccine is the best protection against flu and its complications. Flu vaccine also helps prevent spreading flu from person to person. 2. Inactivated and recombinant flu vaccines You are getting an injectable flu vaccine, which is either an "inactivated" or "recombinant" vaccine. These vaccines do not contain any live influenza virus. They are given by injection with a needle, and often called the "flu shot."  A different live, attenuated (weakened) influenza vaccine is sprayed into the nostrils. This vaccine is described in a separate Vaccine Information Statement. Flu vaccination is recommended every year. Some children 6 months through 8 years of age might need two doses during one year. Flu viruses are always changing. Each year's flu vaccine is made  to protect against 3 or 4 viruses that are likely to cause disease that year. Flu vaccine cannot prevent all cases of flu, but it is the best defense against the disease.  It takes about 2 weeks for protection to develop after the vaccination, and protection lasts several months to a year. Some illnesses that are not caused by influenza virus are often mistaken for flu. Flu vaccine will not prevent these illnesses. It can only prevent influenza. Some inactivated flu vaccine contains a very small amount of a mercury-based preservative called thimerosal. Studies have shown that thimerosal in vaccines is not harmful, but flu vaccines that do not contain a preservative are available. 3. Some people should not get this vaccine Tell the person who gives you the vaccine:  If you have any severe, life-threatening allergies. If you ever had a life-threatening allergic reaction after a dose of flu vaccine, or have a severe allergy to any part of this vaccine, including (for example) an allergy to gelatin, antibiotics, or eggs, you may be advised not to get vaccinated. Most, but not all, types of flu vaccine contain a small amount of egg protein.  If you ever had Guillain-Barr Syndrome (a severe paralyzing illness, also called GBS). Some people with a history of GBS should not get this vaccine. This should be discussed with your doctor.  If you are not feeling well. It is usually okay to get flu vaccine when you have a mild illness, but you might be advised to wait until you feel better. You should come back when you are better. 4. Risks of a vaccine reaction With a vaccine, like any medicine, there is a chance of side   effects. These are usually mild and go away on their own. Problems that could happen after any vaccine:  Brief fainting spells can happen after any medical procedure, including vaccination. Sitting or lying down for about 15 minutes can help prevent fainting, and injuries caused by a fall. Tell  your doctor if you feel dizzy, or have vision changes or ringing in the ears.  Severe shoulder pain and reduced range of motion in the arm where a shot was given can happen, very rarely, after a vaccination.  Severe allergic reactions from a vaccine are very rare, estimated at less than 1 in a million doses. If one were to occur, it would usually be within a few minutes to a few hours after the vaccination. Mild problems following inactivated flu vaccine:  soreness, redness, or swelling where the shot was given  hoarseness  sore, red or itchy eyes  cough  fever  aches  headache  itching  fatigue If these problems occur, they usually begin soon after the shot and last 1 or 2 days. Moderate problems following inactivated flu vaccine:  Young children who get inactivated flu vaccine and pneumococcal vaccine (PCV13) at the same time may be at increased risk for seizures caused by fever. Ask your doctor for more information. Tell your doctor if a child who is getting flu vaccine has ever had a seizure. Inactivated flu vaccine does not contain live flu virus, so you cannot get the flu from this vaccine. As with any medicine, there is a very remote chance of a vaccine causing a serious injury or death. The safety of vaccines is always being monitored. For more information, visit: www.cdc.gov/vaccinesafety/ 5. What if there is a serious reaction? What should I look for?  Look for anything that concerns you, such as signs of a severe allergic reaction, very high fever, or behavior changes. Signs of a severe allergic reaction can include hives, swelling of the face and throat, difficulty breathing, a fast heartbeat, dizziness, and weakness. These would start a few minutes to a few hours after the vaccination. What should I do?  If you think it is a severe allergic reaction or other emergency that can't wait, call 9-1-1 and get the person to the nearest hospital. Otherwise, call your  doctor.  Afterward, the reaction should be reported to the Vaccine Adverse Event Reporting System (VAERS). Your doctor should file this report, or you can do it yourself through the VAERS web site at www.vaers.hhs.gov, or by calling 1-800-822-7967. VAERS does not give medical advice. 6. The National Vaccine Injury Compensation Program The National Vaccine Injury Compensation Program (VICP) is a federal program that was created to compensate people who may have been injured by certain vaccines. Persons who believe they may have been injured by a vaccine can learn about the program and about filing a claim by calling 1-800-338-2382 or visiting the VICP website at www.hrsa.gov/vaccinecompensation. There is a time limit to file a claim for compensation. 7. How can I learn more?  Ask your health care provider.  Call your local or state health department.  Contact the Centers for Disease Control and Prevention (CDC):  Call 1-800-232-4636 (1-800-CDC-INFO) or  Visit CDC's website at www.cdc.gov/flu CDC Vaccine Information Statement (Interim) Inactivated Influenza Vaccine (08/28/2012) Document Released: 10/21/2005 Document Revised: 05/13/2013 Document Reviewed: 12/14/2012 ExitCare Patient Information 2015 [employer]. This information is not intended to replace advice given to you by your health care provider. Make sure you discuss any questions you have with your health   care provider.  

## 2013-11-05 ENCOUNTER — Ambulatory Visit: Payer: Federal, State, Local not specified - PPO | Admitting: Emergency Medicine

## 2013-11-05 ENCOUNTER — Ambulatory Visit (INDEPENDENT_AMBULATORY_CARE_PROVIDER_SITE_OTHER): Payer: Federal, State, Local not specified - PPO | Admitting: Emergency Medicine

## 2013-11-05 VITALS — BP 184/88 | HR 80 | Resp 18

## 2013-11-05 DIAGNOSIS — R030 Elevated blood-pressure reading, without diagnosis of hypertension: Secondary | ICD-10-CM

## 2013-11-05 DIAGNOSIS — IMO0001 Reserved for inherently not codable concepts without codable children: Secondary | ICD-10-CM

## 2013-11-05 MED ORDER — BLOOD PRESSURE MONITOR/L CUFF MISC: Status: DC

## 2013-11-05 NOTE — Progress Notes (Signed)
Patient presented today for a blood pressure check only his BP was 184/88, which is improved from readings yesterday. He will get a new blood pressure monitor to keep an eye on this. Patient is referred back to cardiology for further treatment to try to get his blood pressure under control

## 2013-11-07 ENCOUNTER — Other Ambulatory Visit: Payer: Self-pay | Admitting: Physician Assistant

## 2013-11-07 ENCOUNTER — Telehealth: Payer: Self-pay | Admitting: Emergency Medicine

## 2013-11-07 NOTE — Telephone Encounter (Signed)
Call from patient that he believes he is passing the metformin with the capsule intact and not absorbing the medication. I told him to contact his pharmacy and notify them of the problem he is having and we could switch him to the short acting form of metformin

## 2013-11-09 ENCOUNTER — Other Ambulatory Visit: Payer: Self-pay | Admitting: Physician Assistant

## 2013-11-19 ENCOUNTER — Telehealth: Payer: Self-pay

## 2013-11-19 NOTE — Telephone Encounter (Signed)
Why does he want to change?

## 2013-11-19 NOTE — Telephone Encounter (Signed)
Dr Everlene Farrier, I received a fax from [facility] stating that pt is asking for a new Rx for IM release metformin. See prev phone message. I wasn't sure what dose and instr's you would want to send.

## 2013-11-21 NOTE — Telephone Encounter (Signed)
Clld pt - [phone]- [place] with reasons he's wanting to change medication.

## 2013-11-21 NOTE — Telephone Encounter (Signed)
Dr. Everlene Farrier.Marland KitchenMarland KitchenPt returned call - states that you suggested him that the metformin needed to be changed to IM due to him finding the Metformin pills in the commode after episodes of loose bowels. He states he had advised you that his body had not absorbed the pill at all.

## 2013-11-22 ENCOUNTER — Other Ambulatory Visit: Payer: Self-pay | Admitting: Emergency Medicine

## 2013-11-22 MED ORDER — METFORMIN HCL 500 MG PO TABS: 500.0000 mg | ORAL_TABLET | Freq: Two times a day (BID) | ORAL | Status: DC

## 2013-11-22 NOTE — Telephone Encounter (Signed)
Call patient and let him know I have sent a prescription in for the short acting form

## 2013-11-23 NOTE — Telephone Encounter (Signed)
Pt notified that rx was sent in 
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
## 2013-12-17 ENCOUNTER — Encounter: Payer: Self-pay | Admitting: Internal Medicine

## 2013-12-17 ENCOUNTER — Ambulatory Visit (INDEPENDENT_AMBULATORY_CARE_PROVIDER_SITE_OTHER): Payer: Federal, State, Local not specified - PPO | Admitting: Internal Medicine

## 2013-12-17 ENCOUNTER — Other Ambulatory Visit: Payer: Self-pay | Admitting: Internal Medicine

## 2013-12-17 VITALS — BP 160/100 | HR 71 | Ht 72.0 in | Wt 246.0 lb

## 2013-12-17 DIAGNOSIS — I10 Essential (primary) hypertension: Secondary | ICD-10-CM

## 2013-12-17 DIAGNOSIS — E785 Hyperlipidemia, unspecified: Secondary | ICD-10-CM

## 2013-12-17 MED ORDER — ATORVASTATIN CALCIUM 10 MG PO TABS: 10.0000 mg | ORAL_TABLET | Freq: Every day | ORAL | Status: DC

## 2013-12-17 MED ORDER — AMLODIPINE BESYLATE 5 MG PO TABS: 2.5000 mg | ORAL_TABLET | Freq: Every day | ORAL | Status: DC

## 2013-12-17 NOTE — Patient Instructions (Signed)
**Note De-Identified Catherene Kaleta Obfuscation** Your physician has recommended you make the following change in your medication: start taking Amlodipine 2.5 mg (1/2 of a 5 mg tablet) and increase Lipitor to 10 mg daily  Your physician recommends that you schedule a follow-up appointment in: 1 month

## 2013-12-20 ENCOUNTER — Ambulatory Visit: Payer: Federal, State, Local not specified - PPO | Admitting: Internal Medicine

## 2013-12-31 ENCOUNTER — Other Ambulatory Visit: Payer: Self-pay | Admitting: Emergency Medicine

## 2014-01-01 ENCOUNTER — Other Ambulatory Visit: Payer: Self-pay | Admitting: Emergency Medicine
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
## 2014-01-20 ENCOUNTER — Ambulatory Visit (INDEPENDENT_AMBULATORY_CARE_PROVIDER_SITE_OTHER): Payer: Federal, State, Local not specified - PPO | Admitting: Internal Medicine

## 2014-01-20 ENCOUNTER — Encounter: Payer: Self-pay | Admitting: Internal Medicine

## 2014-01-20 VITALS — BP 150/110 | HR 72 | Ht 72.0 in | Wt 245.0 lb

## 2014-01-20 DIAGNOSIS — I10 Essential (primary) hypertension: Secondary | ICD-10-CM

## 2014-01-20 MED ORDER — AMLODIPINE BESYLATE 5 MG PO TABS
5.0000 mg | ORAL_TABLET | Freq: Every day | ORAL | Status: DC
Start: 2014-01-20 — End: 2014-02-21

## 2014-01-20 NOTE — Patient Instructions (Signed)
Your physician has recommended you make the following change in your medication:  INCREASE Amlodipine to 5 mg once daily  Your physician has requested that you regularly monitor and record your blood pressure readings at home or local drug store. Please use the same machine at the same time of day to check your readings and record them.  Call the office in 2 weeks ([phone]) and ask for Rodman Key, RN to report your BP   Your physician recommends that you schedule a follow-up appointment in: 4 weeks with Dr. Harrington Challenger

## 2014-01-24 ENCOUNTER — Other Ambulatory Visit: Payer: Self-pay | Admitting: Physician Assistant

## 2014-01-28 ENCOUNTER — Other Ambulatory Visit: Payer: Self-pay | Admitting: *Deleted

## 2014-01-28 MED ORDER — CLOPIDOGREL BISULFATE 75 MG PO TABS: 75.0000 mg | ORAL_TABLET | Freq: Every day | ORAL | Status: DC

## 2014-01-30 ENCOUNTER — Telehealth: Payer: Self-pay

## 2014-01-30 MED ORDER — ATORVASTATIN CALCIUM 10 MG PO TABS: 10.0000 mg | ORAL_TABLET | Freq: Every day | ORAL | Status: DC

## 2014-01-30 NOTE — Telephone Encounter (Signed)
Patient tried to refill his generic brand of "Liptor" but was unable to. Per patient [facility] told him the insurance will not cover it now that its too early. Per patient he has been taking 1 tablet a day as directed. Pharmacy indicated to patient he was suppose to only take 1/2 tab a day and he should still have some left. According to the directions on the medication last it was take 1 tab a day. Patient only has 1 tab left and is requesting if our office could contact [facility] or write a new prescription. Patient uses [facility] on the corner of [address] and is call back number is [phone]

## 2014-01-30 NOTE — Telephone Encounter (Signed)
Resent rx- pt does take 1 tablet daily.

## 2014-02-04 ENCOUNTER — Telehealth: Payer: Self-pay

## 2014-02-04 NOTE — Telephone Encounter (Signed)
Rx was re-sent for 1 tab QD on 01/30/14. Pharm should still also have 1 yr of RFs written by Dr Dorris Carnes in Dec for 1 tab QD. Called pt and gave him this info and he reported that pharm told him last night when he went there that they are still waiting to hear from [place]. I advised pt that I will call pharm to make sure they get the Rx/info. Called and gave Rx to pharm again. She advised that they have the Rx we sent, but ins is still not recognizing it as a dose change. She will call the pharmacy help desk and talk to ins.

## 2014-02-04 NOTE — Telephone Encounter (Signed)
Patient is calling to request medication refill for generic Lipitor (atervasatin), sent to [facility] on [address]. Patient states that he called a few days ago for this request and has been out of medication to 2 days now. Patient phone: [phone]

## 2014-02-11 ENCOUNTER — Telehealth: Payer: Self-pay

## 2014-02-11 NOTE — Telephone Encounter (Signed)
PA started for Pantoprazole 40 mg tablets. Approved 12/13/2013 until 02/11/2015.

## 2014-02-13 ENCOUNTER — Telehealth: Payer: Self-pay
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
## 2014-02-21 ENCOUNTER — Encounter: Payer: Self-pay | Admitting: Internal Medicine

## 2014-02-21 ENCOUNTER — Ambulatory Visit (INDEPENDENT_AMBULATORY_CARE_PROVIDER_SITE_OTHER): Payer: Federal, State, Local not specified - PPO | Admitting: Internal Medicine

## 2014-02-21 ENCOUNTER — Other Ambulatory Visit: Payer: Self-pay | Admitting: Internal Medicine

## 2014-02-21 VITALS — BP 136/92 | HR 61 | Ht 72.0 in | Wt 245.0 lb

## 2014-02-21 DIAGNOSIS — I10 Essential (primary) hypertension: Secondary | ICD-10-CM

## 2014-02-21 MED ORDER — AMLODIPINE BESYLATE 10 MG PO TABS
10.0000 mg | ORAL_TABLET | Freq: Every day | ORAL | Status: DC
Start: 2014-02-21 — End: 2015-02-13

## 2014-02-21 NOTE — Patient Instructions (Signed)
Your physician has recommended you make the following change in your medication:  1.) CHANGE AMLODIPINE TO 10 MG DAILY Your physician wants you to follow-up in: [place].  You will receive a reminder letter in the mail two months in advance. If you don't receive a letter, please call our office to schedule the follow-up appointment.  DR ROSS REC. THAT YOU MONITOR YOUR BLOOD PRESSURES FOR THE NEXT MONTH.  CALL BACK OR EMAIL TO REPORT BLOOD PRESSURES TO MD

## 2014-02-27 ENCOUNTER — Other Ambulatory Visit: Payer: Self-pay | Admitting: Emergency Medicine

## 2014-03-10 ENCOUNTER — Ambulatory Visit (INDEPENDENT_AMBULATORY_CARE_PROVIDER_SITE_OTHER): Payer: Federal, State, Local not specified - PPO | Admitting: Emergency Medicine

## 2014-03-10 VITALS — BP 132/84 | HR 77 | Temp 98.3°F | Resp 18 | Ht 73.0 in | Wt 230.0 lb

## 2014-03-10 DIAGNOSIS — J209 Acute bronchitis, unspecified: Secondary | ICD-10-CM

## 2014-03-10 MED ORDER — PROMETHAZINE-CODEINE 6.25-10 MG/5ML PO SYRP: 5.0000 mL | ORAL_SOLUTION | Freq: Four times a day (QID) | ORAL | Status: DC | PRN

## 2014-03-10 MED ORDER — CLARITHROMYCIN 500 MG PO TABS: 500.0000 mg | ORAL_TABLET | Freq: Two times a day (BID) | ORAL | Status: DC

## 2014-03-10 NOTE — Patient Instructions (Signed)
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
## 2014-03-17 ENCOUNTER — Ambulatory Visit (INDEPENDENT_AMBULATORY_CARE_PROVIDER_SITE_OTHER): Payer: Federal, State, Local not specified - PPO

## 2014-03-17 ENCOUNTER — Ambulatory Visit (INDEPENDENT_AMBULATORY_CARE_PROVIDER_SITE_OTHER): Payer: Federal, State, Local not specified - PPO | Admitting: Family Medicine

## 2014-03-17 ENCOUNTER — Telehealth: Payer: Self-pay

## 2014-03-17 VITALS — BP 130/78 | HR 75 | Temp 97.3°F | Resp 18 | Ht 77.0 in | Wt 233.6 lb

## 2014-03-17 DIAGNOSIS — R059 Cough, unspecified: Secondary | ICD-10-CM

## 2014-03-17 DIAGNOSIS — E119 Type 2 diabetes mellitus without complications: Secondary | ICD-10-CM | POA: Diagnosis not present

## 2014-03-17 DIAGNOSIS — R05 Cough: Secondary | ICD-10-CM

## 2014-03-17 DIAGNOSIS — R058 Other specified cough: Secondary | ICD-10-CM

## 2014-03-17 MED ORDER — ALBUTEROL SULFATE HFA 108 (90 BASE) MCG/ACT IN AERS: 2.0000 | INHALATION_SPRAY | Freq: Four times a day (QID) | RESPIRATORY_TRACT | Status: DC | PRN

## 2014-03-17 MED ORDER — PREDNISONE 20 MG PO TABS: ORAL_TABLET | ORAL | Status: DC

## 2014-03-17 MED ORDER — PROMETHAZINE-CODEINE 6.25-10 MG/5ML PO SYRP: 5.0000 mL | ORAL_SOLUTION | Freq: Four times a day (QID) | ORAL | Status: DC | PRN

## 2014-03-17 NOTE — Progress Notes (Signed)
Subjective: 64 year old man who is here last week with a one-week history of a upper respiratory infection and cough. He was evaluated by Dr. Annye Asa and treated with Biaxin and Tussionex. He has continued coughing. He is aching in his chest is improved, but the cough really has just kept on. He has postnasal drainage. He has some pressure behind his eyes from his sinuses. He does not smoke. He has not been running any fever.  Objective: Pleasant alert gentleman in no major distress but he does have a frequent cough while I'm in here. His TMs are normal. Throat clear. Neck supple without significant nodes. Chest is clear to auscultation but has a cough triggered by expiration. Heart regular without murmurs.  Assessment: Persistent bronchitis  Plan: Patient has been adequately treated with previous medications. He does need some more Tussionex. We'll check a chest x-ray and decide what course to take now. He is diabetic but it stayed in good levels with the last hemoglobin A1c of 7.1.  [facility] reading (PRIMARY) by  Dr. Linna Darner Normal chest  Will rx for post-viral cough.  Discussed his diabetes and need to be cautious with his oral intake while he is on the prednisone.

## 2014-03-17 NOTE — Telephone Encounter (Signed)
Spoke with pt, advised to RTC. Pt will come in to be seen.

## 2014-03-17 NOTE — Telephone Encounter (Signed)
Assessment and Plan: Bronchitis biaxin tussionex   Signed,  Ellison Carwin, MD  Does pt need to RTC?

## 2014-03-17 NOTE — Patient Instructions (Signed)
Take the prednisone 3 pills daily for 2 days, then 2 daily for 2 days, then 1 daily for 2 days  Complete your course of antibiotics  Take the cough syrup 1 teaspoon every 4-6 hours as needed for cough  If not improving I will need to give you a different antibiotic, but I believe that treating the inflammatory condition is the main thing at this point.  Use the inhaler 2 inhalations about 4 times daily as directed

## 2014-03-17 NOTE — Telephone Encounter (Signed)
Yep.  He was treated as though he has pneumonia

## 2014-03-17 NOTE — Telephone Encounter (Signed)
Pt states he is still having a productive cough,please advise   Best phone for pt is [place]

## 2014-04-21 ENCOUNTER — Other Ambulatory Visit: Payer: Self-pay | Admitting: Emergency Medicine

## 2014-04-22 ENCOUNTER — Other Ambulatory Visit: Payer: Self-pay | Admitting: Emergency Medicine

## 2014-04-22 NOTE — Telephone Encounter (Signed)
Dr Everlene Farrier, it has been almost 6 mos since you saw pt for HTN. Do you want to OK a 90 day RF as req'd w/note that pt needs OV? Pended.

## 2014-04-27 ENCOUNTER — Other Ambulatory Visit: Payer: Self-pay | Admitting: Emergency Medicine

## 2014-04-27 ENCOUNTER — Other Ambulatory Visit: Payer: Self-pay | Admitting: Internal Medicine

## 2014-04-28 NOTE — Telephone Encounter (Signed)
Dr Everlene Farrier just OKd another HTN med for 90 days w/note pt needs BP follow up. I will send this in the same way.

## 2014-05-03 ENCOUNTER — Other Ambulatory Visit: Payer: Self-pay | Admitting: Physician Assistant

## 2014-05-08 ENCOUNTER — Ambulatory Visit (INDEPENDENT_AMBULATORY_CARE_PROVIDER_SITE_OTHER): Payer: Federal, State, Local not specified - PPO | Admitting: Emergency Medicine

## 2014-05-08 ENCOUNTER — Telehealth: Payer: Self-pay | Admitting: Gastroenterology

## 2014-05-08 ENCOUNTER — Encounter: Payer: Self-pay | Admitting: Emergency Medicine

## 2014-05-08 VITALS — BP 122/76 | HR 82 | Temp 98.5°F | Resp 16 | Ht 72.0 in | Wt 232.0 lb

## 2014-05-08 DIAGNOSIS — R209 Unspecified disturbances of skin sensation: Secondary | ICD-10-CM

## 2014-05-08 DIAGNOSIS — Z1211 Encounter for screening for malignant neoplasm of colon: Secondary | ICD-10-CM

## 2014-05-08 DIAGNOSIS — R208 Other disturbances of skin sensation: Secondary | ICD-10-CM | POA: Diagnosis not present

## 2014-05-08 DIAGNOSIS — E785 Hyperlipidemia, unspecified: Secondary | ICD-10-CM | POA: Diagnosis not present

## 2014-05-08 DIAGNOSIS — R030 Elevated blood-pressure reading, without diagnosis of hypertension: Secondary | ICD-10-CM

## 2014-05-08 DIAGNOSIS — IMO0001 Reserved for inherently not codable concepts without codable children: Secondary | ICD-10-CM

## 2014-05-08 DIAGNOSIS — E119 Type 2 diabetes mellitus without complications: Secondary | ICD-10-CM

## 2014-05-08 DIAGNOSIS — L989 Disorder of the skin and subcutaneous tissue, unspecified: Secondary | ICD-10-CM

## 2014-05-08 DIAGNOSIS — Z23 Encounter for immunization: Secondary | ICD-10-CM

## 2014-05-08 LAB — LIPID PANEL
CHOLESTEROL: 139 mg/dL (ref 0–200)
HDL: 34 mg/dL — AB (ref >=40)
LDL Cholesterol: 76 mg/dL (ref 0–99)
TRIGLYCERIDES: 146 mg/dL (ref <150)
Total CHOL/HDL Ratio: 4.1 Ratio
VLDL: 29 mg/dL (ref 0–40)

## 2014-05-08 LAB — COMPLETE METABOLIC PANEL WITH GFR
ALBUMIN: 4.3 g/dL (ref 3.5–5.2)
ALT: 15 U/L (ref 0–53)
AST: 14 U/L (ref 0–37)
Alkaline Phosphatase: 100 U/L (ref 39–117)
BUN: 15 mg/dL (ref 6–23)
CO2: 26 mEq/L (ref 19–32)
CREATININE: 0.91 mg/dL (ref 0.50–1.35)
Calcium: 8.7 mg/dL (ref 8.4–10.5)
Chloride: 106 mEq/L (ref 96–112)
GFR, EST NON AFRICAN AMERICAN: 89 mL/min
GFR, Est African American: >89 mL/min
GLUCOSE: 236 mg/dL — AB (ref 70–99)
POTASSIUM: 4.1 meq/L (ref 3.5–5.3)
Sodium: 141 mEq/L (ref 135–145)
Total Bilirubin: 0.4 mg/dL (ref 0.2–1.2)
Total Protein: 6.9 g/dL (ref 6.0–8.3)

## 2014-05-08 LAB — POCT GLYCOSYLATED HEMOGLOBIN (HGB A1C): Hemoglobin A1C: 8.7

## 2014-05-08 LAB — GLUCOSE, POCT (MANUAL RESULT ENTRY): POC Glucose: 245 mg/dl — AB (ref 70–99)

## 2014-05-08 MED ORDER — METFORMIN HCL ER 500 MG PO TB24: ORAL_TABLET | ORAL | Status: DC

## 2014-05-08 MED ORDER — PNEUMOCOCCAL 13-VAL CONJ VACC IM SUSP: 0.5000 mL | INTRAMUSCULAR | Status: DC
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
## 2014-05-08 NOTE — Telephone Encounter (Signed)
Patient advised that he will need an office visit to discuss.  He has not been in the office since 10/2011.  He is scheduled to see Dr. Fuller Plan on 06/11/14

## 2014-05-08 NOTE — Telephone Encounter (Signed)
Patient states that the last time he came in for office consult (on blood thinner) for colonoscopy, he was advised by Dr. Fuller Plan to come off blood thinner for 10 days, but per patient, Dr. Dorris Carnes wouldn't "play ball".  He doesn't want to come in for a face to face, if Dr. Harrington Challenger isn't going to cooperate.  He would like some input and help in getting this coordinated.

## 2014-05-18 ENCOUNTER — Other Ambulatory Visit: Payer: Self-pay | Admitting: Emergency Medicine

## 2014-05-29 ENCOUNTER — Other Ambulatory Visit: Payer: Self-pay | Admitting: Emergency Medicine

## 2014-05-31 ENCOUNTER — Telehealth: Payer: Self-pay | Admitting: Family Medicine

## 2014-05-31 NOTE — Telephone Encounter (Signed)
lmom to call back and reschedule his appt on 08/07/14

## 2014-06-11 ENCOUNTER — Encounter: Payer: Self-pay | Admitting: Gastroenterology

## 2014-06-11 ENCOUNTER — Telehealth: Payer: Self-pay

## 2014-06-11 ENCOUNTER — Ambulatory Visit (INDEPENDENT_AMBULATORY_CARE_PROVIDER_SITE_OTHER): Payer: Federal, State, Local not specified - PPO | Admitting: Gastroenterology

## 2014-06-11 VITALS — BP 144/72 | HR 64 | Ht 71.5 in | Wt 236.1 lb

## 2014-06-11 DIAGNOSIS — Z8673 Personal history of transient ischemic attack (TIA), and cerebral infarction without residual deficits: Secondary | ICD-10-CM | POA: Diagnosis not present

## 2014-06-11 DIAGNOSIS — K219 Gastro-esophageal reflux disease without esophagitis: Secondary | ICD-10-CM | POA: Diagnosis not present

## 2014-06-11 DIAGNOSIS — Z7901 Long term (current) use of anticoagulants: Secondary | ICD-10-CM

## 2014-06-11 DIAGNOSIS — Z86010 Personal history of colonic polyps: Secondary | ICD-10-CM

## 2014-06-11 MED ORDER — NA SULFATE-K SULFATE-MG SULF 17.5-3.13-1.6 GM/177ML PO SOLN: 1.0000 | Freq: Once | ORAL | Status: DC

## 2014-06-11 NOTE — Patient Instructions (Addendum)
Normal BMI (Body Mass Index- based on height and weight) is between 19 and 25. Your BMI today is Body mass index is 32.48 kg/(m^2). Marland Kitchen Please consider follow up  regarding your BMI with your Primary Care Provider.  You have been scheduled for a colonoscopy. Please follow written instructions given to you at your visit today.  Please pick up your prep supplies at the pharmacy within the next 1-3 days. If you use inhalers (even only as needed), please bring them with you on the day of your procedure. Your physician has requested that you go to www.startemmi.com and enter the access code given to you at your visit today. This web site gives a general overview about your procedure. However, you should still follow specific instructions given to you by our office regarding your preparation for the procedure.  Thank you for choosing me and [facility].  Pricilla Riffle. Dagoberto Ligas., MD., Marval Regal  cc: [name], MD

## 2014-06-11 NOTE — Progress Notes (Signed)
    History of Present Illness: This is a 64 year old male referred by [name], MD for the evaluation of personal history of adenomatous colon polyps. Last colonoscopy performed in 2008. He is maintained on Plavix for history of CVA and TIA. Denies weight loss, abdominal pain, constipation, diarrhea, change in stool caliber, melena, hematochezia, nausea, vomiting, dysphagia, reflux symptoms, chest pain.  Review of Systems: Pertinent positive and negative review of systems were noted in the above HPI section. All other review of systems were otherwise negative.  Current Medications, Allergies, Past Medical History, Past Surgical History, Family History and Social History were reviewed in [employer] record.  Physical Exam: General: Well developed, well nourished, no acute distress Head: Normocephalic and atraumatic Eyes:  sclerae anicteric, EOMI Ears: Normal auditory acuity Mouth: No deformity or lesions Neck: Supple, no masses or thyromegaly Lungs: Clear throughout to auscultation Heart: Regular rate and rhythm; no murmurs, rubs or bruits Abdomen: Soft, non tender and non distended. No masses, hepatosplenomegaly or hernias noted. Normal Bowel sounds Rectal: Deferred to colonoscopy Musculoskeletal: Symmetrical with no gross deformities  Skin: No lesions on visible extremities Pulses:  Normal pulses noted Extremities: No clubbing, cyanosis, edema or deformities noted Neurological: Alert oriented x 4, grossly nonfocal Cervical Nodes:  No significant cervical adenopathy Inguinal Nodes: No significant inguinal adenopathy Psychological:  Alert and cooperative. Normal mood and affect  Assessment and Recommendations:  1. Personal history of adenomatous colon polyps. Overdue for surveillance colonoscopy. The risks (including bleeding, perforation, infection, missed lesions, medication reactions and possible hospitalization or surgery if complications occur),  benefits, and alternatives to colonoscopy with possible biopsy and possible polypectomy were discussed with the patient and they consent to proceed.   2. History of TIA, CVA. Hold Plavix for 5 days before procedure - will instruct when and how to resume after procedure. Additional rare but real risk of cardiovascular event such as heart attack or ischemia/infarct of other organs off Plavix explained and need to seek urgent help if this occurs. Will communicate by phone or EMR with patient's prescribing provider that to confirm holding Plavix is reasonable in this case.   3. GERD. Continue pantoprazole 40 mg daily and standard antireflux measures.  cc: [name], MD [address] [place], [place] [id number]

## 2014-06-11 NOTE — Telephone Encounter (Signed)
  06/11/2014   RE: Emmanuell Kantz DOB: Jun 17, 1950 MRN: [id number]   Dear Dr. Harrington Challenger,    We have scheduled the above patient for an endoscopic procedure. Our records show that he is on anticoagulation therapy.   Please advise as to how long the patient may come off his therapy of Plavix prior to the procedure, which is scheduled for 07/29/14.  Please answer and route the phone note to Marlon Pel, Dana.  Sincerely,    Marlon Pel, CMA

## 2014-06-11 NOTE — Telephone Encounter (Signed)
Patient notified to hold Plavix 5 days before his procedure per Dr. Harrington Challenger. Pt verbalized understanding.

## 2014-06-11 NOTE — Telephone Encounter (Signed)
He should stop Plavix 5 days prior  Resume after

## 2014-07-02 ENCOUNTER — Ambulatory Visit (INDEPENDENT_AMBULATORY_CARE_PROVIDER_SITE_OTHER): Payer: Federal, State, Local not specified - PPO | Admitting: Adult Health

## 2014-07-02 ENCOUNTER — Encounter: Payer: Self-pay | Admitting: Adult Health

## 2014-07-02 VITALS — BP 153/90 | HR 65 | Ht 73.0 in | Wt 238.0 lb

## 2014-07-02 DIAGNOSIS — R208 Other disturbances of skin sensation: Secondary | ICD-10-CM | POA: Diagnosis not present

## 2014-07-02 DIAGNOSIS — R209 Unspecified disturbances of skin sensation: Secondary | ICD-10-CM

## 2014-07-02 MED ORDER — CARBAMAZEPINE 200 MG PO TABS: 200.0000 mg | ORAL_TABLET | Freq: Every day | ORAL | Status: DC
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
## 2014-07-02 NOTE — Progress Notes (Signed)
I agree with the assessment and plan as directed by NP .The patient is known to me .   Kelsey Edman, MD  

## 2014-07-02 NOTE — Patient Instructions (Signed)
Continue Carbamazepine.  If symptoms worsen or you develop new symptoms let us know.

## 2014-07-04 ENCOUNTER — Ambulatory Visit: Payer: Federal, State, Local not specified - PPO | Admitting: Adult Health

## 2014-07-24 ENCOUNTER — Other Ambulatory Visit: Payer: Self-pay | Admitting: Internal Medicine

## 2014-07-24 ENCOUNTER — Other Ambulatory Visit: Payer: Self-pay | Admitting: Emergency Medicine

## 2014-07-29 ENCOUNTER — Ambulatory Visit (AMBULATORY_SURGERY_CENTER): Payer: Federal, State, Local not specified - PPO | Admitting: Gastroenterology

## 2014-07-29 ENCOUNTER — Encounter: Payer: Self-pay | Admitting: Gastroenterology

## 2014-07-29 VITALS — BP 162/95 | HR 59 | Temp 98.0°F | Resp 15 | Ht 71.0 in | Wt 236.0 lb

## 2014-07-29 DIAGNOSIS — D123 Benign neoplasm of transverse colon: Secondary | ICD-10-CM | POA: Diagnosis not present

## 2014-07-29 DIAGNOSIS — D124 Benign neoplasm of descending colon: Secondary | ICD-10-CM | POA: Diagnosis not present

## 2014-07-29 DIAGNOSIS — Z86010 Personal history of colonic polyps: Secondary | ICD-10-CM

## 2014-07-29 LAB — GLUCOSE, CAPILLARY
GLUCOSE-CAPILLARY: 186 mg/dL — AB (ref 65–99)
Glucose-Capillary: 156 mg/dL — ABNORMAL HIGH (ref 65–99)

## 2014-07-29 MED ORDER — SODIUM CHLORIDE 0.9 % IV SOLN: 500.0000 mL | INTRAVENOUS | Status: DC

## 2014-07-29 NOTE — Progress Notes (Signed)
Called to room to assist during endoscopic procedure.  Patient ID and intended procedure confirmed with present staff. Received instructions for my participation in the procedure from the performing physician.  

## 2014-07-29 NOTE — Progress Notes (Signed)
1554 A/ox3 pleased with MAC, report to [employer]

## 2014-07-29 NOTE — Op Note (Signed)
[place]  [address] [place], [id number]   COLONOSCOPY PROCEDURE REPORT  PATIENT: Andrew, [name]  MR#: [id number] BIRTHDATE: [birth year] , 79  yrs. old GENDER: male ENDOSCOPIST: Ladene Artist, MD, [facility] PROCEDURE DATE:  07/29/2014 PROCEDURE:   Colonoscopy, surveillance , Colonoscopy with biopsy, and Colonoscopy with snare polypectomy First Screening Colonoscopy - Avg.  risk and is 50 yrs.  old or older - No.  Prior Negative Screening - Now for repeat screening. N/A  History of Adenoma - Now for follow-up colonoscopy & has been > or = to 3 yrs.  Yes hx of adenoma.  Has been 3 or more years since last colonoscopy.  Polyps removed today? Yes ASA CLASS:   Class III INDICATIONS:Surveillance due to prior colonic neoplasia and PH Colon Adenoma. MEDICATIONS: Monitored anesthesia care and Propofol 150 mg IV DESCRIPTION OF PROCEDURE:   After the risks benefits and alternatives of the procedure were thoroughly explained, informed consent was obtained.  The digital rectal exam revealed no abnormalities of the rectum.   The LB [id number]  endoscope was introduced through the anus and advanced to the cecum, which was identified by both the appendix and ileocecal valve. No adverse events experienced.   The quality of the prep was excellent. (Suprep was used)  The instrument was then slowly withdrawn as the colon was fully examined. Estimated blood loss is zero unless otherwise noted in this procedure report.  COLON FINDINGS: A sessile polyp measuring 4 mm in size was found in the transverse colon.  A polypectomy was performed with cold forceps.  The resection was complete, the polyp tissue was completely retrieved and sent to histology.   A sessile polyp measuring 6 mm in size was found in the descending colon.  A polypectomy was performed with a cold snare.  The resection was complete, the polyp tissue was completely retrieved and sent to histology.   There was  mild diverticulosis noted in the sigmoid colon.   The examination was otherwise normal.  Retroflexed views revealed no abnormalities. The time to cecum = 1.3 Withdrawal time = 11.0   The scope was withdrawn and the procedure completed. COMPLICATIONS: There were no immediate complications.  ENDOSCOPIC IMPRESSION: 1.   Sessile polyp in the transverse colon; polypectomy performed with cold forceps 2.   Sessile polyp in the descending colon; polypectomy performed with a cold snare 3.   Mild diverticulosis in the sigmoid colon  RECOMMENDATIONS: 1.  Await pathology results 2.  High fiber diet with liberal fluid intake. 3.  Repeat Colonoscopy in 5 years.  eSigned:  Ladene Artist, MD, [facility] 07/29/2014 3:51 PM   cc: [name], MD

## 2014-07-29 NOTE — Patient Instructions (Signed)
YOU HAD AN ENDOSCOPIC PROCEDURE TODAY AT [facility]:   Refer to the procedure report that was given to you for any specific questions about what was found during the examination.  If the procedure report does not answer your questions, please call your gastroenterologist to clarify.  If you requested that your care partner not be given the details of your procedure findings, then the procedure report has been included in a sealed envelope for you to review at your convenience later.  YOU SHOULD EXPECT: Some feelings of bloating in the abdomen. Passage of more gas than usual.  Walking can help get rid of the air that was put into your GI tract during the procedure and reduce the bloating. If you had a lower endoscopy (such as a colonoscopy or flexible sigmoidoscopy) you may notice spotting of blood in your stool or on the toilet paper. If you underwent a bowel prep for your procedure, you may not have a normal bowel movement for a few days.  Please Note:  You might notice some irritation and congestion in your nose or some drainage.  This is from the oxygen used during your procedure.  There is no need for concern and it should clear up in a day or so.  SYMPTOMS TO REPORT IMMEDIATELY:   Following lower endoscopy (colonoscopy or flexible sigmoidoscopy):  Excessive amounts of blood in the stool  Significant tenderness or worsening of abdominal pains  Swelling of the abdomen that is new, acute  Fever of 100F or higher   For urgent or emergent issues, a gastroenterologist can be reached at any hour by calling [phone].   DIET: Your first meal following the procedure should be a small meal and then it is ok to progress to your normal diet. Heavy or fried foods are harder to digest and may make you feel nauseous or bloated.  Likewise, meals heavy in dairy and vegetables can increase bloating.  Drink plenty of fluids but you should avoid alcoholic beverages for 24 hours. Increase  the fiber and water in your diet.  ACTIVITY:  You should plan to take it easy for the rest of today and you should NOT DRIVE or use heavy machinery until tomorrow (because of the sedation medicines used during the test).    FOLLOW UP: Our staff will call the number listed on your records the next business day following your procedure to check on you and address any questions or concerns that you may have regarding the information given to you following your procedure. If we do not reach you, we will leave a message.  However, if you are feeling well and you are not experiencing any problems, there is no need to return our call.  We will assume that you have returned to your regular daily activities without incident.  If any biopsies were taken you will be contacted by phone or by letter within the next 1-3 weeks.  Please call us at [phone] if you have not heard about the biopsies in 3 weeks.    SIGNATURES/CONFIDENTIALITY: You and/or your care partner have signed paperwork which will be entered into your electronic medical record.  These signatures attest to the fact that that the information above on your After Visit Summary has been reviewed and is understood.  Full responsibility of the confidentiality of this discharge information lies with you and/or your care-partner.  YOU MAY START YOUR PLAVIX TOMORROW PER DR STARK.

## 2014-07-30 ENCOUNTER — Telehealth: Payer: Self-pay

## 2014-07-30 NOTE — Telephone Encounter (Signed)
  Follow up Call-  Call back number 07/29/2014  Post procedure Call Back phone  # [phone]  Permission to leave phone message Yes     Patient questions:  Do you have a fever, pain , or abdominal swelling? No. Pain Score  0 *  Have you tolerated food without any problems? Yes.    Have you been able to return to your normal activities? Yes.    Do you have any questions about your discharge instructions: Diet   No. Medications  No. Follow up visit  No.  Do you have questions or concerns about your Care? No.  Actions: * If pain score is 4 or above: No action needed, pain <4.  No problems per the pt' wife.  The pt was still asleep. maw

## 2014-07-31 ENCOUNTER — Other Ambulatory Visit: Payer: Self-pay | Admitting: Physician Assistant

## 2014-08-05 ENCOUNTER — Encounter: Payer: Self-pay | Admitting: Emergency Medicine

## 2014-08-05 ENCOUNTER — Encounter: Payer: Self-pay | Admitting: Gastroenterology

## 2014-08-05 ENCOUNTER — Ambulatory Visit (INDEPENDENT_AMBULATORY_CARE_PROVIDER_SITE_OTHER): Payer: Federal, State, Local not specified - PPO | Admitting: Emergency Medicine

## 2014-08-05 VITALS — BP 136/84 | HR 66 | Temp 98.5°F | Resp 16 | Ht 71.0 in | Wt 235.2 lb

## 2014-08-05 DIAGNOSIS — E785 Hyperlipidemia, unspecified: Secondary | ICD-10-CM | POA: Diagnosis not present

## 2014-08-05 DIAGNOSIS — I10 Essential (primary) hypertension: Secondary | ICD-10-CM

## 2014-08-05 DIAGNOSIS — Z114 Encounter for screening for human immunodeficiency virus [HIV]: Secondary | ICD-10-CM

## 2014-08-05 DIAGNOSIS — G459 Transient cerebral ischemic attack, unspecified: Secondary | ICD-10-CM

## 2014-08-05 DIAGNOSIS — E119 Type 2 diabetes mellitus without complications: Secondary | ICD-10-CM | POA: Diagnosis not present

## 2014-08-05 LAB — BASIC METABOLIC PANEL WITH GFR
BUN: 12 mg/dL (ref 7–25)
CALCIUM: 9.1 mg/dL (ref 8.6–10.3)
CHLORIDE: 105 meq/L (ref 98–110)
CO2: 25 meq/L (ref 20–31)
CREATININE: 0.93 mg/dL (ref 0.70–1.25)
GFR, Est African American: >89 mL/min (ref >=60)
GFR, Est Non African American: 87 mL/min (ref >=60)
GLUCOSE: 221 mg/dL — AB (ref 65–99)
Potassium: 3.8 mEq/L (ref 3.5–5.3)
SODIUM: 140 meq/L (ref 135–146)

## 2014-08-05 LAB — GLUCOSE, POCT (MANUAL RESULT ENTRY): POC Glucose: 219 mg/dl — AB (ref 70–99)

## 2014-08-05 LAB — LIPID PANEL
CHOL/HDL RATIO: 3.8 ratio (ref <=5.0)
Cholesterol: 151 mg/dL (ref 125–200)
HDL: 40 mg/dL (ref >=40)
LDL Cholesterol: 84 mg/dL (ref <130)
Triglycerides: 133 mg/dL (ref <150)
VLDL: 27 mg/dL (ref <30)

## 2014-08-05 LAB — POCT GLYCOSYLATED HEMOGLOBIN (HGB A1C): Hemoglobin A1C: 9
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
## 2014-08-06 LAB — HIV ANTIBODY (ROUTINE TESTING W REFLEX): HIV: NONREACTIVE

## 2014-08-07 ENCOUNTER — Ambulatory Visit: Payer: Federal, State, Local not specified - PPO | Admitting: Emergency Medicine

## 2014-08-16 ENCOUNTER — Other Ambulatory Visit: Payer: Self-pay | Admitting: Emergency Medicine

## 2014-08-25 ENCOUNTER — Other Ambulatory Visit: Payer: Self-pay | Admitting: Emergency Medicine

## 2014-08-26 NOTE — Telephone Encounter (Signed)
Close encounter 

## 2014-09-10 LAB — HM DIABETES EYE EXAM

## 2014-09-19 ENCOUNTER — Encounter: Payer: Self-pay | Admitting: Emergency Medicine

## 2014-10-04 ENCOUNTER — Other Ambulatory Visit: Payer: Self-pay | Admitting: Emergency Medicine

## 2014-10-06 NOTE — Telephone Encounter (Signed)
Dr Everlene Farrier, pt had check up in July, but don't see GERD discussed. OK to RF?

## 2014-10-14 ENCOUNTER — Encounter: Payer: Self-pay | Admitting: Emergency Medicine

## 2014-10-23 ENCOUNTER — Other Ambulatory Visit: Payer: Self-pay | Admitting: Emergency Medicine

## 2014-10-28 ENCOUNTER — Encounter: Payer: Self-pay | Admitting: Family Medicine

## 2014-10-28 ENCOUNTER — Encounter: Payer: Self-pay | Admitting: Emergency Medicine

## 2014-10-28 ENCOUNTER — Other Ambulatory Visit: Payer: Self-pay | Admitting: *Deleted

## 2014-10-28 ENCOUNTER — Other Ambulatory Visit: Payer: Self-pay | Admitting: Internal Medicine

## 2014-10-28 ENCOUNTER — Ambulatory Visit (INDEPENDENT_AMBULATORY_CARE_PROVIDER_SITE_OTHER): Payer: Federal, State, Local not specified - PPO | Admitting: Emergency Medicine

## 2014-10-28 VITALS — BP 137/79 | HR 67 | Temp 98.0°F | Resp 16 | Ht 71.0 in | Wt 233.0 lb

## 2014-10-28 DIAGNOSIS — G458 Other transient cerebral ischemic attacks and related syndromes: Secondary | ICD-10-CM

## 2014-10-28 DIAGNOSIS — E119 Type 2 diabetes mellitus without complications: Secondary | ICD-10-CM

## 2014-10-28 DIAGNOSIS — R030 Elevated blood-pressure reading, without diagnosis of hypertension: Secondary | ICD-10-CM

## 2014-10-28 DIAGNOSIS — Z125 Encounter for screening for malignant neoplasm of prostate: Secondary | ICD-10-CM | POA: Diagnosis not present

## 2014-10-28 DIAGNOSIS — E785 Hyperlipidemia, unspecified: Secondary | ICD-10-CM | POA: Diagnosis not present

## 2014-10-28 DIAGNOSIS — IMO0001 Reserved for inherently not codable concepts without codable children: Secondary | ICD-10-CM

## 2014-10-28 DIAGNOSIS — Z23 Encounter for immunization: Secondary | ICD-10-CM | POA: Diagnosis not present

## 2014-10-28 LAB — LIPID PANEL
CHOL/HDL RATIO: 4.2 ratio (ref <=5.0)
Cholesterol: 152 mg/dL (ref 125–200)
HDL: 36 mg/dL — ABNORMAL LOW (ref >=40)
LDL Cholesterol: 84 mg/dL (ref <130)
Triglycerides: 159 mg/dL — ABNORMAL HIGH (ref <150)
VLDL: 32 mg/dL — AB (ref <30)

## 2014-10-28 LAB — BASIC METABOLIC PANEL WITH GFR
BUN: 14 mg/dL (ref 7–25)
CO2: 29 mmol/L (ref 20–31)
Calcium: 9 mg/dL (ref 8.6–10.3)
Chloride: 106 mmol/L (ref 98–110)
Creat: 0.81 mg/dL (ref 0.70–1.25)
GFR, Est Non African American: >89 mL/min (ref >=60)
GLUCOSE: 187 mg/dL — AB (ref 65–99)
POTASSIUM: 4 mmol/L (ref 3.5–5.3)
Sodium: 142 mmol/L (ref 135–146)

## 2014-10-28 LAB — MICROALBUMIN, URINE: MICROALB UR: 2 mg/dL

## 2014-10-28 LAB — GLUCOSE, POCT (MANUAL RESULT ENTRY): POC GLUCOSE: 189 mg/dL — AB (ref 70–99)

## 2014-10-28 LAB — POCT GLYCOSYLATED HEMOGLOBIN (HGB A1C): Hemoglobin A1C: 7.3

## 2014-10-28 LAB — HEMOGLOBIN A1C: HEMOGLOBIN A1C: 7.3 % — AB (ref 4.0–6.0)

## 2014-10-28 MED ORDER — CLOPIDOGREL BISULFATE 75 MG PO TABS: 75.0000 mg | ORAL_TABLET | Freq: Every day | ORAL | Status: DC
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
## 2014-10-28 NOTE — Telephone Encounter (Signed)
Refilled clopidogrel per fax request from [facility] on [address]

## 2014-10-29 LAB — PSA: PSA: 2.2 ng/mL (ref <=4.00)

## 2014-11-03 ENCOUNTER — Encounter: Payer: Self-pay | Admitting: Family Medicine

## 2014-11-25 ENCOUNTER — Other Ambulatory Visit: Payer: Self-pay | Admitting: Internal Medicine

## 2014-11-25 ENCOUNTER — Other Ambulatory Visit: Payer: Self-pay | Admitting: Emergency Medicine

## 2014-12-23 ENCOUNTER — Other Ambulatory Visit: Payer: Self-pay | Admitting: Internal Medicine

## 2015-01-24 ENCOUNTER — Other Ambulatory Visit: Payer: Self-pay | Admitting: Physician Assistant

## 2015-01-27 ENCOUNTER — Other Ambulatory Visit: Payer: Self-pay | Admitting: Physician Assistant

## 2015-01-27 ENCOUNTER — Other Ambulatory Visit: Payer: Self-pay | Admitting: *Deleted

## 2015-02-13 ENCOUNTER — Other Ambulatory Visit: Payer: Self-pay | Admitting: Internal Medicine

## 2015-02-21 ENCOUNTER — Other Ambulatory Visit: Payer: Self-pay | Admitting: Emergency Medicine

## 2015-02-24 ENCOUNTER — Ambulatory Visit: Payer: Federal, State, Local not specified - PPO | Admitting: Emergency Medicine

## 2015-03-05 ENCOUNTER — Ambulatory Visit (INDEPENDENT_AMBULATORY_CARE_PROVIDER_SITE_OTHER): Payer: Federal, State, Local not specified - PPO | Admitting: Emergency Medicine

## 2015-03-05 ENCOUNTER — Encounter: Payer: Self-pay | Admitting: Emergency Medicine

## 2015-03-05 VITALS — BP 118/65 | HR 57 | Temp 98.0°F | Resp 16 | Ht 73.0 in | Wt 234.4 lb

## 2015-03-05 DIAGNOSIS — E785 Hyperlipidemia, unspecified: Secondary | ICD-10-CM

## 2015-03-05 DIAGNOSIS — E119 Type 2 diabetes mellitus without complications: Secondary | ICD-10-CM | POA: Diagnosis not present

## 2015-03-05 DIAGNOSIS — Z114 Encounter for screening for human immunodeficiency virus [HIV]: Secondary | ICD-10-CM

## 2015-03-05 DIAGNOSIS — Z1159 Encounter for screening for other viral diseases: Secondary | ICD-10-CM | POA: Diagnosis not present

## 2015-03-05 DIAGNOSIS — R030 Elevated blood-pressure reading, without diagnosis of hypertension: Secondary | ICD-10-CM | POA: Diagnosis not present

## 2015-03-05 DIAGNOSIS — IMO0001 Reserved for inherently not codable concepts without codable children: Secondary | ICD-10-CM

## 2015-03-05 LAB — BASIC METABOLIC PANEL WITH GFR
BUN: 12 mg/dL (ref 7–25)
CHLORIDE: 106 mmol/L (ref 98–110)
CO2: 24 mmol/L (ref 20–31)
Calcium: 8.8 mg/dL (ref 8.6–10.3)
Creat: 0.78 mg/dL (ref 0.70–1.25)
Glucose, Bld: 201 mg/dL — ABNORMAL HIGH (ref 65–99)
POTASSIUM: 3.8 mmol/L (ref 3.5–5.3)
SODIUM: 140 mmol/L (ref 135–146)

## 2015-03-05 LAB — GLUCOSE, POCT (MANUAL RESULT ENTRY): POC GLUCOSE: 221 mg/dL — AB (ref 70–99)

## 2015-03-05 LAB — POCT GLYCOSYLATED HEMOGLOBIN (HGB A1C): Hemoglobin A1C: 7.8
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
## 2015-03-06 LAB — HIV ANTIBODY (ROUTINE TESTING W REFLEX): HIV: NONREACTIVE

## 2015-03-06 LAB — HEPATITIS C ANTIBODY: HCV AB: NEGATIVE

## 2015-03-08 ENCOUNTER — Encounter: Payer: Self-pay | Admitting: *Deleted

## 2015-03-13 ENCOUNTER — Other Ambulatory Visit: Payer: Self-pay | Admitting: Internal Medicine

## 2015-03-18 ENCOUNTER — Other Ambulatory Visit: Payer: Self-pay | Admitting: Internal Medicine

## 2015-03-22 ENCOUNTER — Other Ambulatory Visit: Payer: Self-pay | Admitting: Emergency Medicine

## 2015-03-25 ENCOUNTER — Other Ambulatory Visit: Payer: Self-pay | Admitting: Internal Medicine

## 2015-03-29 ENCOUNTER — Other Ambulatory Visit: Payer: Self-pay | Admitting: Internal Medicine

## 2015-03-31 ENCOUNTER — Other Ambulatory Visit: Payer: Self-pay | Admitting: *Deleted

## 2015-03-31 ENCOUNTER — Telehealth: Payer: Self-pay | Admitting: Internal Medicine

## 2015-03-31 MED ORDER — AMLODIPINE BESYLATE 10 MG PO TABS: 10.0000 mg | ORAL_TABLET | Freq: Every day | ORAL | Status: DC

## 2015-03-31 NOTE — Telephone Encounter (Signed)
°  New Prob    *STAT* If patient is at the pharmacy, call can be transferred to refill team.   1. Which medications need to be refilled? (please list name of each medication and dose if known) Amlodipine 10 mg  2. Which pharmacy/location (including street and city if local pharmacy) is medication to be sent to? [facility] on corner of [address] and [address]  3. Do they need a 30 day or 90 day supply? 30 days   Pt has scheduled appointment with Dr. Harrington Challenger for 4/20.

## 2015-04-02 ENCOUNTER — Other Ambulatory Visit: Payer: Self-pay | Admitting: Cardiology

## 2015-04-02 NOTE — Telephone Encounter (Signed)
REFILL 

## 2015-04-29 ENCOUNTER — Other Ambulatory Visit: Payer: Self-pay | Admitting: Cardiology

## 2015-04-30 ENCOUNTER — Ambulatory Visit (INDEPENDENT_AMBULATORY_CARE_PROVIDER_SITE_OTHER): Payer: Federal, State, Local not specified - PPO | Admitting: Internal Medicine

## 2015-04-30 ENCOUNTER — Other Ambulatory Visit: Payer: Self-pay | Admitting: Physician Assistant

## 2015-04-30 ENCOUNTER — Encounter: Payer: Self-pay | Admitting: Internal Medicine

## 2015-04-30 ENCOUNTER — Other Ambulatory Visit: Payer: Self-pay | Admitting: Emergency Medicine

## 2015-04-30 VITALS — BP 160/94 | HR 65 | Ht 73.0 in | Wt 237.0 lb

## 2015-04-30 DIAGNOSIS — I10 Essential (primary) hypertension: Secondary | ICD-10-CM

## 2015-04-30 MED ORDER — AMLODIPINE BESYLATE 10 MG PO TABS: 10.0000 mg | ORAL_TABLET | Freq: Every day | ORAL | Status: DC
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
## 2015-04-30 NOTE — Patient Instructions (Signed)
Your physician recommends that you continue on your current medications as directed. Please refer to the Current Medication list given to you today. Your physician wants you to follow-up in: 1 YEAR WITH DR. ROSS.  You will receive a reminder letter in the mail two months in advance. If you don't receive a letter, please call our office to schedule the follow-up appointment.  

## 2015-05-11 ENCOUNTER — Other Ambulatory Visit: Payer: Self-pay | Admitting: Emergency Medicine

## 2015-05-11 ENCOUNTER — Other Ambulatory Visit: Payer: Self-pay | Admitting: Physician Assistant

## 2015-05-11 DIAGNOSIS — I10 Essential (primary) hypertension: Secondary | ICD-10-CM

## 2015-05-11 MED ORDER — CARVEDILOL 25 MG PO TABS: 25.0000 mg | ORAL_TABLET | Freq: Two times a day (BID) | ORAL | Status: DC

## 2015-05-11 NOTE — Progress Notes (Signed)
Patient out of his carvedilol.  Dr. Everlene Farrier requested 1 year worth.  Sent to pharmacy.

## 2015-05-19 ENCOUNTER — Other Ambulatory Visit: Payer: Self-pay | Admitting: Emergency Medicine

## 2015-05-19 MED ORDER — CLOPIDOGREL BISULFATE 75 MG PO TABS: 75.0000 mg | ORAL_TABLET | Freq: Every day | ORAL | Status: DC

## 2015-05-24 ENCOUNTER — Other Ambulatory Visit: Payer: Self-pay | Admitting: Emergency Medicine

## 2015-05-25 ENCOUNTER — Encounter: Payer: Self-pay | Admitting: Adult Health

## 2015-05-28 ENCOUNTER — Telehealth: Payer: Self-pay | Admitting: *Deleted

## 2015-05-28 NOTE — Telephone Encounter (Signed)
------------------------------------------------------------   Andrew [name]             CID [id number]  Patient SAME                 Pt's Dr [facility]     Area Code 51 Phone# [phone] * DOB 01-10-2051    RE CALLING TO CONFIRM HIS NEXT APPT                                                                       Disp:Y/N N If Y = C/B If No Response In 30minutes ============================================================

## 2015-06-18 ENCOUNTER — Other Ambulatory Visit: Payer: Self-pay | Admitting: Emergency Medicine

## 2015-06-22 ENCOUNTER — Other Ambulatory Visit: Payer: Self-pay | Admitting: Emergency Medicine

## 2015-06-24 ENCOUNTER — Other Ambulatory Visit: Payer: Self-pay | Admitting: Adult Health

## 2015-06-25 ENCOUNTER — Ambulatory Visit (INDEPENDENT_AMBULATORY_CARE_PROVIDER_SITE_OTHER): Payer: Federal, State, Local not specified - PPO | Admitting: Emergency Medicine

## 2015-06-25 ENCOUNTER — Encounter: Payer: Self-pay | Admitting: Emergency Medicine

## 2015-06-25 VITALS — BP 152/75 | HR 65 | Temp 98.2°F | Resp 16 | Ht 72.0 in | Wt 233.2 lb

## 2015-06-25 DIAGNOSIS — G459 Transient cerebral ischemic attack, unspecified: Secondary | ICD-10-CM

## 2015-06-25 DIAGNOSIS — E785 Hyperlipidemia, unspecified: Secondary | ICD-10-CM

## 2015-06-25 DIAGNOSIS — E119 Type 2 diabetes mellitus without complications: Secondary | ICD-10-CM | POA: Diagnosis not present

## 2015-06-25 LAB — GLUCOSE, POCT (MANUAL RESULT ENTRY): POC GLUCOSE: 206 mg/dL — AB (ref 70–99)

## 2015-06-25 LAB — POCT GLYCOSYLATED HEMOGLOBIN (HGB A1C): Hemoglobin A1C: 7.8

## 2015-06-25 MED ORDER — CANAGLIFLOZIN 100 MG PO TABS: 100.0000 mg | ORAL_TABLET | Freq: Every day | ORAL | Status: DC
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
## 2015-06-25 NOTE — Patient Instructions (Signed)
     IF you received an x-ray today, you will receive an invoice from [facility]. Please contact [facility] at [phone] with questions or concerns regarding your invoice.   IF you received labwork today, you will receive an invoice from [facility]. Please contact [facility] at [phone] with questions or concerns regarding your invoice.   Our billing staff will not be able to assist you with questions regarding bills from these companies.  You will be contacted with the lab results as soon as they are available. The fastest way to get your results is to activate your My Chart account. Instructions are located on the last page of this paperwork. If you have not heard from us regarding the results in 2 weeks, please contact this office.      

## 2015-06-27 ENCOUNTER — Other Ambulatory Visit: Payer: Self-pay | Admitting: Emergency Medicine

## 2015-06-28 ENCOUNTER — Other Ambulatory Visit: Payer: Self-pay | Admitting: Emergency Medicine

## 2015-06-29 ENCOUNTER — Telehealth: Payer: Self-pay

## 2015-06-29 ENCOUNTER — Other Ambulatory Visit: Payer: Self-pay | Admitting: Emergency Medicine

## 2015-06-29 DIAGNOSIS — R739 Hyperglycemia, unspecified: Secondary | ICD-10-CM

## 2015-06-29 MED ORDER — EMPAGLIFLOZIN 10 MG PO TABS: 10.0000 mg | ORAL_TABLET | Freq: Every day | ORAL | Status: DC

## 2015-06-29 NOTE — Telephone Encounter (Signed)
Dr Everlene Farrier, pt just here for chronic issues, but don't see GERD addressed. OK to RF?

## 2015-06-29 NOTE — Telephone Encounter (Signed)
Please let patient know I called in the jardiance

## 2015-06-29 NOTE — Telephone Encounter (Signed)
Dr Everlene Farrier, ins will not cover Invokana. It looks like they prefer Jardiance. Do you want to change Rx to this?

## 2015-06-30 NOTE — Telephone Encounter (Signed)
Our phone system has been down this week, and pt does not have MyChart. I will try to reach him as soon as phones are working.

## 2015-07-01 MED ORDER — OLMESARTAN MEDOXOMIL 40 MG PO TABS: 40.0000 mg | ORAL_TABLET | Freq: Every day | ORAL | Status: DC

## 2015-07-01 MED ORDER — CLONIDINE HCL 0.3 MG/24HR TD PTWK: MEDICATED_PATCH | TRANSDERMAL | Status: DC

## 2015-07-01 NOTE — Addendum Note (Signed)
Addended by: [name] A on: 07/01/2015 02:04 PM   Modules accepted: Orders

## 2015-07-01 NOTE — Telephone Encounter (Signed)
Completed PA on covermymeds. Pending. 

## 2015-07-01 NOTE — Telephone Encounter (Signed)
I think most of the other choices were  cost related. Most of the time we try and stay away from the sulfonylureas. See if we have to use a different class first.

## 2015-07-01 NOTE — Telephone Encounter (Signed)
Dr Everlene Farrier, now Vania Rea requires a PA, but it looks like it is just step therapy that needs to be proved for it to be covered. The form asks the following question that I am sending to you to answer, please. I don't see any other DM meds in EPIC other than metformin, but didn't know if there was a contraindication?  Did the patient have an inadequate treatment response, intolerance or contraindication to an alpha-glucosidase inhibitor, sulfonylurea, or thiazolidinedione (TZD) therapy?

## 2015-07-02 ENCOUNTER — Ambulatory Visit: Payer: Federal, State, Local not specified - PPO | Admitting: Adult Health

## 2015-07-02 NOTE — Telephone Encounter (Signed)
Dr Everlene Farrier, the PA was denied because pt has not failed or have contraindication to one of the following: an alpha-glucosidase inhibitor, sulfonylurea, or thiazolidinedione (TZD) therapy. Do you want to Rx one of these?

## 2015-07-03 NOTE — Telephone Encounter (Signed)
We can try him on Januvia 100 mg 1 a day. He can have #90 tablets with 3 refills.

## 2015-07-04 NOTE — Telephone Encounter (Signed)
Script was called into pharmacy. Also left a vm for patient to call back so we can let him know which medication was sent in.

## 2015-07-06 ENCOUNTER — Encounter: Payer: Self-pay | Admitting: Adult Health

## 2015-07-06 ENCOUNTER — Ambulatory Visit (INDEPENDENT_AMBULATORY_CARE_PROVIDER_SITE_OTHER): Payer: Federal, State, Local not specified - PPO | Admitting: Adult Health

## 2015-07-06 VITALS — BP 126/76 | HR 60 | Ht 72.0 in | Wt 233.6 lb

## 2015-07-06 DIAGNOSIS — Z5181 Encounter for therapeutic drug level monitoring: Secondary | ICD-10-CM | POA: Diagnosis not present

## 2015-07-06 DIAGNOSIS — R208 Other disturbances of skin sensation: Secondary | ICD-10-CM | POA: Diagnosis not present

## 2015-07-06 DIAGNOSIS — R209 Unspecified disturbances of skin sensation: Secondary | ICD-10-CM

## 2015-07-06 NOTE — Patient Instructions (Signed)
Continue carbamazepine Blood work today If your symptoms worsen or you develop new symptoms please let us know.   Marland Kitchen

## 2015-07-06 NOTE — Progress Notes (Signed)
I have read the note, and I agree with the clinical assessment and plan.  WILLIS,CHARLES KEITH   
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
## 2015-07-07 ENCOUNTER — Telehealth: Payer: Self-pay | Admitting: *Deleted

## 2015-07-07 LAB — CBC WITH DIFFERENTIAL/PLATELET
BASOS ABS: 0 10*3/uL (ref 0.0–0.2)
Basos: 1 %
EOS (ABSOLUTE): 0.2 10*3/uL (ref 0.0–0.4)
Eos: 3 %
Hematocrit: 37 % — ABNORMAL LOW (ref 37.5–51.0)
Hemoglobin: 12.5 g/dL — ABNORMAL LOW (ref 12.6–17.7)
Immature Grans (Abs): 0 10*3/uL (ref 0.0–0.1)
Immature Granulocytes: 0 %
LYMPHS ABS: 2 10*3/uL (ref 0.7–3.1)
Lymphs: 42 %
MCH: 27.7 pg (ref 26.6–33.0)
MCHC: 33.8 g/dL (ref 31.5–35.7)
MCV: 82 fL (ref 79–97)
MONOS ABS: 0.4 10*3/uL (ref 0.1–0.9)
Monocytes: 8 %
Neutrophils Absolute: 2.2 10*3/uL (ref 1.4–7.0)
Neutrophils: 46 %
Platelets: 182 10*3/uL (ref 150–379)
RBC: 4.51 x10E6/uL (ref 4.14–5.80)
RDW: 13.9 % (ref 12.3–15.4)
WBC: 4.8 10*3/uL (ref 3.4–10.8)

## 2015-07-07 LAB — CARBAMAZEPINE LEVEL, TOTAL: CARBAMAZEPINE LVL: 4.3 ug/mL (ref 4.0–12.0)

## 2015-07-07 NOTE — Telephone Encounter (Signed)
I LMVM (home- ok per DPR), that lab results were ok.  Please call back if questions.

## 2015-07-07 NOTE — Telephone Encounter (Signed)
-----   Message from Ward Givens, NP sent at 07/07/2015  7:40 AM EDT ----- Lab work ok. Please call with results.

## 2015-08-12 ENCOUNTER — Telehealth: Payer: Self-pay

## 2015-08-12 NOTE — Telephone Encounter (Signed)
I will place in your box. 

## 2015-08-12 NOTE — Telephone Encounter (Signed)
Patient request for Dr. Everlene Farrier to complete a renewal of disability parking placard form. Patient need form completed as soon as possible. Patient request for [place] to call him when form is ready for pick up. [phone]. I will place the form inside of the nurse's box.

## 2015-08-13 ENCOUNTER — Telehealth: Payer: Self-pay

## 2015-08-13 NOTE — Telephone Encounter (Signed)
PA was approved for pantoprazole through covermymeds. When I got the decision response back it stated that Protonix was approved (even though I had filled out the form for generic). I asked pharm to try running for name brand Protonix if generic still did not go through.

## 2015-08-21 ENCOUNTER — Other Ambulatory Visit: Payer: Self-pay | Admitting: Emergency Medicine

## 2015-08-21 ENCOUNTER — Other Ambulatory Visit: Payer: Self-pay | Admitting: Physician Assistant

## 2015-08-21 ENCOUNTER — Ambulatory Visit (INDEPENDENT_AMBULATORY_CARE_PROVIDER_SITE_OTHER): Payer: Federal, State, Local not specified - PPO | Admitting: Physician Assistant

## 2015-08-21 ENCOUNTER — Encounter: Payer: Self-pay | Admitting: Physician Assistant

## 2015-08-21 VITALS — BP 140/82 | HR 68 | Temp 98.5°F | Resp 16 | Ht 71.5 in | Wt 232.4 lb

## 2015-08-21 DIAGNOSIS — J069 Acute upper respiratory infection, unspecified: Secondary | ICD-10-CM | POA: Diagnosis not present

## 2015-08-21 DIAGNOSIS — J309 Allergic rhinitis, unspecified: Secondary | ICD-10-CM

## 2015-08-21 DIAGNOSIS — R05 Cough: Secondary | ICD-10-CM | POA: Diagnosis not present

## 2015-08-21 DIAGNOSIS — R059 Cough, unspecified: Secondary | ICD-10-CM

## 2015-08-21 DIAGNOSIS — R058 Other specified cough: Secondary | ICD-10-CM

## 2015-08-21 MED ORDER — ALBUTEROL SULFATE HFA 108 (90 BASE) MCG/ACT IN AERS: 2.0000 | INHALATION_SPRAY | Freq: Four times a day (QID) | RESPIRATORY_TRACT | 0 refills | Status: DC | PRN

## 2015-08-21 MED ORDER — BENZONATATE 100 MG PO CAPS: 100.0000 mg | ORAL_CAPSULE | Freq: Three times a day (TID) | ORAL | 0 refills | Status: DC | PRN

## 2015-08-21 MED ORDER — FLUTICASONE PROPIONATE 50 MCG/ACT NA SUSP: 2.0000 | Freq: Every day | NASAL | 6 refills | Status: DC

## 2015-08-21 MED ORDER — PROMETHAZINE-DM 6.25-15 MG/5ML PO SYRP: 5.0000 mL | ORAL_SOLUTION | Freq: Four times a day (QID) | ORAL | 0 refills | Status: DC | PRN

## 2015-08-21 NOTE — Progress Notes (Signed)
Andrew [name]  MRN: [id number] DOB: December 21, 1950  Subjective:  Andrew [name] is a 65 y.o. male seen in office today for a chief complaint of scratchy throat x 6 days.   Pt played in a golf tournament 6 days ago and woke up the next morning with scratchy throat.Has associated itchy, watery eye, sneezing, ear fullness, wheezing (x1 this am), cough with clear phlegm production, and frontal sinus pressure.Denies shortness of breath and chest pain.   Pt has tried alka seltzer-D and rescue inhaler with relief of wheezing.   Has seasonal allergies (worse in Summer and Spring) and takes zyrtec daily.   Review of Systems  Constitutional: Positive for diaphoresis ( x 1 last night). Negative for chills, fatigue and fever.  Gastrointestinal: Negative for abdominal pain, diarrhea, nausea and vomiting.  Neurological: Negative for dizziness.    Patient Active Problem List   Diagnosis Date Noted  . Seizure disorder ([place]) 08/27/2012  . Sensory disturbance 07/03/2012  . Hyperlipidemia   . Hypertension   . Arthritis   . Weight loss 02/07/2011  . Fatigue 02/07/2011  . TIA (transient ischemic attack) 12/22/2010  . Diabetes mellitus ([place]) 12/22/2010  . COLONIC POLYPS, ADENOMATOUS 03/22/2007  . DYSLIPIDEMIA 03/22/2007  . GOUT 03/22/2007  . MORBID OBESITY 03/22/2007  . GLAUCOMA 03/22/2007  . HYPERTENSION 03/22/2007  . RHINITIS 03/22/2007  . GERD 03/22/2007  . HEMATOCHEZIA 03/22/2007  . HEMORRHOIDS, INTERNAL 10/25/2006  . DIVERTICULOSIS, COLON 10/25/2006    Current Outpatient Prescriptions on File Prior to Visit  Medication Sig Dispense Refill  . albuterol (PROVENTIL HFA;VENTOLIN HFA) 108 (90 BASE) MCG/ACT inhaler Inhale 2 puffs into the lungs every 6 (six) hours as needed for wheezing or shortness of breath (cough, shortness of breath or wheezing.). 1 Inhaler 0  . amLODipine (NORVASC) 10 MG tablet Take 1 tablet (10 mg total) by mouth daily. 90 tablet 3  . atorvastatin (LIPITOR) 10  MG tablet TAKE 1 TABLET BY MOUTH DAILY 90 tablet 1  . carbamazepine (TEGRETOL) 200 MG tablet TAKE 1 TABLET BY MOUTH DAILY 90 tablet 0  . carvedilol (COREG) 25 MG tablet Take 1 tablet (25 mg total) by mouth 2 (two) times daily with a meal. 180 tablet 3  . cetirizine (ZYRTEC) 10 MG tablet Take 10 mg by mouth daily.    . cloNIDine (CATAPRES - DOSED IN MG/24 HR) 0.3 mg/24hr patch UNWRAP AND APPLY 1 PATCH TO DRY, INTACT SKIN ONCE A WEEK. 12 patch 2  . clopidogrel (PLAVIX) 75 MG tablet Take 1 tablet (75 mg total) by mouth daily. KEEP OV. 90 tablet 3  . dorzolamide-timolol (COSOPT) 22.3-6.8 MG/ML ophthalmic solution 1 drop daily. Left eye    . JANUVIA 100 MG tablet Take 100 mg by mouth daily. Reported on 07/06/2015    . metFORMIN (GLUCOPHAGE-XR) 500 MG 24 hr tablet TAKE 2 TABLETS BY MOUTH DAILY 60 tablet 1  . naproxen sodium (ANAPROX) 220 MG tablet Take 220 mg by mouth as needed.    Marland Kitchen olmesartan (BENICAR) 40 MG tablet Take 1 tablet (40 mg total) by mouth daily. 90 tablet 3  . pantoprazole (PROTONIX) 40 MG tablet TAKE 1 TABLET BY MOUTH EVERY DAY 90 tablet 1  . Saline (ARY NASAL MIST ALLERGY/SINUS NA) Place into the nose as needed.    . tobramycin (TOBREX) 0.3 % ophthalmic solution Place 1 drop into the left eye every 4 (four) hours. 5 mL 0  . Blood Pressure Monitoring (BLOOD PRESSURE MONITOR/L CUFF) MISC To monitor blood pressure daily/  has elevated blood pressure readings on medication for hypertension (Patient not taking: Reported on 08/21/2015) 1 each 0  . empagliflozin (JARDIANCE) 10 MG TABS tablet Take 10 mg by mouth daily. (Patient not taking: Reported on 07/06/2015) 30 tablet 11   No current facility-administered medications on file prior to visit.     Allergies  Allergen Reactions  . Adhesive [Tape] Rash  . Latex Rash  . Aspirin   . Ether   . Hydrocodone   . Lexapro [Escitalopram Oxalate]   . Other     SSRI'S    Objective:  BP 140/82 (BP Location: Left Arm, Patient Position: Sitting,  Cuff Size: Normal)   Pulse 68   Temp 98.5 F (36.9 C) (Oral)   Resp 16   Ht 5' 11.5" (1.816 m)   Wt 232 lb 6.4 oz (105.4 kg)   SpO2 98%   BMI 31.96 kg/m   Physical Exam  Constitutional: He is oriented to person, place, and time and well-developed, well-nourished, and in no distress.  HENT:  Head: Normocephalic and atraumatic.  Right Ear: Tympanic membrane, external ear and ear canal normal.  Left Ear: Tympanic membrane and external ear normal.  Nose: Mucosal edema present. Right sinus exhibits no maxillary sinus tenderness and no frontal sinus tenderness. Left sinus exhibits no maxillary sinus tenderness and no frontal sinus tenderness.  Mouth/Throat: Uvula is midline and mucous membranes are normal. No posterior oropharyngeal erythema.  Eyes: Conjunctivae are normal.  Neck: Normal range of motion.  Cardiovascular: Normal rate, regular rhythm, normal heart sounds and intact distal pulses.   Pulmonary/Chest: Effort normal and breath sounds normal. He has no wheezes. He has no rhonchi. He has no rales.  Lymphadenopathy:       Head (right side): No submental, no submandibular, no tonsillar, no preauricular, no posterior auricular and no occipital adenopathy present.       Head (left side): No submental, no submandibular, no tonsillar, no preauricular, no posterior auricular and no occipital adenopathy present.    He has no cervical adenopathy.       Right: No supraclavicular adenopathy present.       Left: No supraclavicular adenopathy present.  Neurological: He is alert and oriented to person, place, and time. Gait normal.  Skin: Skin is warm and dry.  Psychiatric: Affect normal.  Vitals reviewed.   Assessment and Plan :   1. Cough 2. Acute upper respiratory infection -Recommend supportive care - albuterol (PROVENTIL HFA;VENTOLIN HFA) 108 (90 Base) MCG/ACT inhaler; Inhale 2 puffs into the lungs every 6 (six) hours as needed for wheezing or shortness of breath (cough, shortness  of breath or wheezing.).  Dispense: 1 Inhaler; Refill: 0 - promethazine-dextromethorphan (PROMETHAZINE-DM) 6.25-15 MG/5ML syrup; Take 5 mLs by mouth 4 (four) times daily as needed for cough.  Dispense: 118 mL; Refill: 0 - benzonatate (TESSALON) 100 MG capsule; Take 1-2 capsules (100-200 mg total) by mouth 3 (three) times daily as needed for cough.  Dispense: 40 capsule; Refill: 0 -Return to clinic in 7 days if symptoms do not improve, follow up sooner if symptoms worsen  3. Allergic rhinitis, unspecified allergic rhinitis type - fluticasone (FLONASE) 50 MCG/ACT nasal spray; Place 2 sprays into both nostrils daily.  Dispense: 16 g; Refill: [place] PA-C  [facility] 08/21/2015 2:03 PM

## 2015-08-21 NOTE — Patient Instructions (Addendum)
-   We will treat this as a respiratory viral infection.  - I recommend you rest, drink plenty of fluids, eat light meals including soups.  - You may use cough syrup at night for your cough and sore throat, Tessalon pearls during the day. Be aware that cough syrup can definitely make you drowsy and sleepy so do not drive or operate any heavy machinery if it is affecting you during the day.  - You may also use Tylenol or ibuprofen over-the-counter for your sore throat.  - Please let me know if you are not seeing any improvement or get worse in seven days.   -Use flonase and zyrtec daily. Recommend OTC eyedrops with antihistamine for itchy eyes.  -Use inhaler q 4-6 hours as needed for wheezing.     IF you received an x-ray today, you will receive an invoice from [facility]. Please contact [facility] at [phone] with questions or concerns regarding your invoice.   IF you received labwork today, you will receive an invoice from [employer]. Please contact [facility] at [phone] with questions or concerns regarding your invoice.   Our billing staff will not be able to assist you with questions regarding bills from these companies.  You will be contacted with the lab results as soon as they are available. The fastest way to get your results is to activate your My Chart account. Instructions are located on the last page of this paperwork. If you have not heard from [place] regarding the results in 2 weeks, please contact this office.

## 2015-09-10 LAB — HM DIABETES EYE EXAM

## 2015-09-12 ENCOUNTER — Other Ambulatory Visit: Payer: Self-pay | Admitting: Physician Assistant

## 2015-09-12 DIAGNOSIS — R05 Cough: Secondary | ICD-10-CM

## 2015-09-12 DIAGNOSIS — R059 Cough, unspecified: Secondary | ICD-10-CM

## 2015-09-19 ENCOUNTER — Other Ambulatory Visit: Payer: Self-pay | Admitting: Neurology

## 2015-09-28 ENCOUNTER — Ambulatory Visit (INDEPENDENT_AMBULATORY_CARE_PROVIDER_SITE_OTHER): Payer: Federal, State, Local not specified - PPO | Admitting: Emergency Medicine

## 2015-09-28 VITALS — BP 138/82 | HR 75 | Temp 98.2°F | Resp 18 | Ht 71.5 in | Wt 236.0 lb

## 2015-09-28 DIAGNOSIS — E785 Hyperlipidemia, unspecified: Secondary | ICD-10-CM | POA: Diagnosis not present

## 2015-09-28 DIAGNOSIS — E119 Type 2 diabetes mellitus without complications: Secondary | ICD-10-CM

## 2015-09-28 DIAGNOSIS — IMO0001 Reserved for inherently not codable concepts without codable children: Secondary | ICD-10-CM

## 2015-09-28 DIAGNOSIS — G459 Transient cerebral ischemic attack, unspecified: Secondary | ICD-10-CM | POA: Diagnosis not present

## 2015-09-28 DIAGNOSIS — R030 Elevated blood-pressure reading, without diagnosis of hypertension: Secondary | ICD-10-CM | POA: Diagnosis not present

## 2015-09-28 DIAGNOSIS — Z23 Encounter for immunization: Secondary | ICD-10-CM | POA: Diagnosis not present

## 2015-09-28 LAB — BASIC METABOLIC PANEL WITH GFR
BUN: 13 mg/dL (ref 7–25)
CHLORIDE: 105 mmol/L (ref 98–110)
CO2: 27 mmol/L (ref 20–31)
Calcium: 9.1 mg/dL (ref 8.6–10.3)
Creat: 0.81 mg/dL (ref 0.70–1.25)
GFR, Est African American: >89 mL/min (ref >=60)
GFR, Est Non African American: >89 mL/min (ref >=60)
Glucose, Bld: 187 mg/dL — ABNORMAL HIGH (ref 65–99)
POTASSIUM: 4.2 mmol/L (ref 3.5–5.3)
SODIUM: 142 mmol/L (ref 135–146)

## 2015-09-28 LAB — LIPID PANEL
CHOL/HDL RATIO: 3.4 ratio (ref <=5.0)
CHOLESTEROL: 157 mg/dL (ref 125–200)
HDL: 46 mg/dL (ref >=40)
LDL Cholesterol: 83 mg/dL (ref <130)
TRIGLYCERIDES: 140 mg/dL (ref <150)
VLDL: 28 mg/dL (ref <30)

## 2015-09-28 LAB — POCT GLYCOSYLATED HEMOGLOBIN (HGB A1C): HEMOGLOBIN A1C: 7.9

## 2015-09-28 LAB — GLUCOSE, POCT (MANUAL RESULT ENTRY): POC Glucose: 189 mg/dl — AB (ref 70–99)

## 2015-09-28 MED ORDER — OLMESARTAN MEDOXOMIL 40 MG PO TABS: 40.0000 mg | ORAL_TABLET | Freq: Every day | ORAL | 3 refills | Status: DC

## 2015-09-28 NOTE — Patient Instructions (Addendum)
I have placed a referral for you to see the endocrinologist. Please make a follow-up with Dr. Lorelei Pont  As your  new PCP.    IF you received an x-ray today, you will receive an invoice from [facility]. Please contact [facility] at [phone] with questions or concerns regarding your invoice.   IF you received labwork today, you will receive an invoice from [employer]. Please contact [facility] at [phone] with questions or concerns regarding your invoice.   Our billing staff will not be able to assist you with questions regarding bills from these companies.  You will be contacted with the lab results as soon as they are available. The fastest way to get your results is to activate your My Chart account. Instructions are located on the last page of this paperwork. If you have not heard from [place] regarding the results in 2 weeks, please contact this office.     Influenza (Flu) Vaccine (Inactivated or Recombinant):  1. Why get vaccinated? Influenza ("flu") is a contagious disease that spreads around the [place] every year, usually between October and May. Flu is caused by influenza viruses, and is spread mainly by coughing, sneezing, and close contact. Anyone can get flu. Flu strikes suddenly and can last several days. Symptoms vary by age, but can include:  fever/chills  sore throat  muscle aches  fatigue  cough  headache  runny or stuffy nose Flu can also lead to pneumonia and blood infections, and cause diarrhea and seizures in children. If you have a medical condition, such as heart or lung disease, flu can make it worse. Flu is more dangerous for some people. Infants and young children, people 72 years of age and older, pregnant women, and people with certain health conditions or a weakened immune system are at greatest risk. Each year thousands of people in the [place] die from flu, and many more are hospitalized. Flu  vaccine can:  keep you from getting flu,  make flu less severe if you do get it, and  keep you from spreading flu to your family and other people. 2. Inactivated and recombinant flu vaccines A dose of flu vaccine is recommended every flu season. Children 6 months through 90 years of age may need two doses during the same flu season. Everyone else needs only one dose each flu season. Some inactivated flu vaccines contain a very small amount of a mercury-based preservative called thimerosal. Studies have not shown thimerosal in vaccines to be harmful, but flu vaccines that do not contain thimerosal are available. There is no live flu virus in flu shots. They cannot cause the flu. There are many flu viruses, and they are always changing. Each year a new flu vaccine is made to protect against three or four viruses that are likely to cause disease in the upcoming flu season. But even when the vaccine doesn't exactly match these viruses, it may still provide some protection. Flu vaccine cannot prevent:  flu that is caused by a virus not covered by the vaccine, or  illnesses that look like flu but are not. It takes about 2 weeks for protection to develop after vaccination, and protection lasts through the flu season. 3. Some people should not get this vaccine Tell the person who is giving you the vaccine:  If you have any severe, life-threatening allergies. If you ever had a life-threatening allergic reaction after a dose of flu vaccine, or have a severe allergy to any part of  this vaccine, you may be advised not to get vaccinated. Most, but not all, types of flu vaccine contain a small amount of egg protein.  If you ever had Guillain-Barre Syndrome (also called GBS). Some people with a history of GBS should not get this vaccine. This should be discussed with your doctor.  If you are not feeling well. It is usually okay to get flu vaccine when you have a mild illness, but you might be asked to come  back when you feel better. 4. Risks of a vaccine reaction With any medicine, including vaccines, there is a chance of reactions. These are usually mild and go away on their own, but serious reactions are also possible. Most people who get a flu shot do not have any problems with it. Minor problems following a flu shot include:  soreness, redness, or swelling where the shot was given  hoarseness  sore, red or itchy eyes  cough  fever  aches  headache  itching  fatigue If these problems occur, they usually begin soon after the shot and last 1 or 2 days. More serious problems following a flu shot can include the following:  There may be a small increased risk of Guillain-Barre Syndrome (GBS) after inactivated flu vaccine. This risk has been estimated at 1 or 2 additional cases per million people vaccinated. This is much lower than the risk of severe complications from flu, which can be prevented by flu vaccine.  Young children who get the flu shot along with pneumococcal vaccine (PCV13) and/or DTaP vaccine at the same time might be slightly more likely to have a seizure caused by fever. Ask your doctor for more information. Tell your doctor if a child who is getting flu vaccine has ever had a seizure. Problems that could happen after any injected vaccine:  People sometimes faint after a medical procedure, including vaccination. Sitting or lying down for about 15 minutes can help prevent fainting, and injuries caused by a fall. Tell your doctor if you feel dizzy, or have vision changes or ringing in the ears.  Some people get severe pain in the shoulder and have difficulty moving the arm where a shot was given. This happens very rarely.  Any medication can cause a severe allergic reaction. Such reactions from a vaccine are very rare, estimated at about 1 in a million doses, and would happen within a few minutes to a few hours after the vaccination. As with any medicine, there is a  very remote chance of a vaccine causing a serious injury or death. The safety of vaccines is always being monitored. For more information, visit: http://www.aguilar.org/ 5. What if there is a serious reaction? What should I look for?  Look for anything that concerns you, such as signs of a severe allergic reaction, very high fever, or unusual behavior. Signs of a severe allergic reaction can include hives, swelling of the face and throat, difficulty breathing, a fast heartbeat, dizziness, and weakness. These would start a few minutes to a few hours after the vaccination. What should I do?  If you think it is a severe allergic reaction or other emergency that can't wait, call 9-1-1 and get the person to the nearest hospital. Otherwise, call your doctor.  Reactions should be reported to the Vaccine Adverse Event Reporting System (VAERS). Your doctor should file this report, or you can do it yourself through the VAERS web site at www.vaers.SamedayNews.es, or by calling 5182954552. VAERS does not give medical advice. 6. The  National Vaccine Injury Compensation Program The National Vaccine Injury Compensation Program (VICP) is a federal program that was created to compensate people who may have been injured by certain vaccines. Persons who believe they may have been injured by a vaccine can learn about the program and about filing a claim by calling 850 088 5079 or visiting the [place] website at GoldCloset.com.ee. There is a time limit to file a claim for compensation. 7. How can I learn more?  Ask your healthcare provider. He or she can give you the vaccine package insert or suggest other sources of information.  Call your local or state health department.  Contact the Centers for Disease Control and Prevention (CDC):  Call (563)524-8034 (1-800-CDC-INFO) or  Visit CDC's website at https://gibson.com/ Vaccine Information Statement Inactivated Influenza Vaccine (08/16/2013)   This  information is not intended to replace advice given to you by your health care provider. Make sure you discuss any questions you have with your health care provider.   Document Released: 10/21/2005 Document Revised: 01/17/2014 Document Reviewed: 08/19/2013 Elsevier Interactive Patient Education [employer].
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
## 2015-10-19 ENCOUNTER — Ambulatory Visit (INDEPENDENT_AMBULATORY_CARE_PROVIDER_SITE_OTHER): Payer: Federal, State, Local not specified - PPO | Admitting: Endocrinology

## 2015-10-19 ENCOUNTER — Encounter: Payer: Self-pay | Admitting: Endocrinology

## 2015-10-19 VITALS — BP 132/84 | HR 62 | Ht 72.0 in | Wt 233.0 lb

## 2015-10-19 DIAGNOSIS — E1151 Type 2 diabetes mellitus with diabetic peripheral angiopathy without gangrene: Secondary | ICD-10-CM

## 2015-10-19 MED ORDER — BAYER CONTOUR MONITOR W/DEVICE KIT: 1.0000 | PACK | Freq: Once | 0 refills | Status: AC

## 2015-10-19 MED ORDER — GLUCOSE BLOOD VI STRP: 1.0000 | ORAL_STRIP | Freq: Every day | 3 refills | Status: DC
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
## 2015-10-19 NOTE — Patient Instructions (Addendum)
good diet and exercise significantly improve the control of your diabetes.  please let me know if you wish to be referred to a dietician.  high blood sugar is very risky to your health.  you should see an eye doctor and dentist every year.  It is very important to get all recommended vaccinations.  Controlling your blood pressure and cholesterol drastically reduces the damage diabetes does to your body.  Those who smoke should quit.  Please discuss these with your doctor.  A diabetes blood test is requested for you today.  We'll let you know about the results. I have sent a prescription to your pharmacy, for a meter, and strips.  check your blood sugar once a day.  vary the time of day when you check, between before the 3 meals, and at bedtime.  also check if you have symptoms of your blood sugar being too high or too low.  please keep a record of the readings and bring it to your next appointment here (or you can bring the meter itself).  You can write it on any piece of paper.  please call us sooner if your blood sugar goes below 70, or if you have a lot of readings over 200.  Please come back for a follow-up appointment in 4 months.

## 2015-10-21 ENCOUNTER — Other Ambulatory Visit: Payer: Self-pay | Admitting: Endocrinology

## 2015-10-21 LAB — FRUCTOSAMINE: FRUCTOSAMINE: 323 umol/L — AB (ref 190–270)

## 2015-10-21 MED ORDER — DAPAGLIFLOZIN PROPANEDIOL 5 MG PO TABS: 5.0000 mg | ORAL_TABLET | Freq: Every day | ORAL | 11 refills | Status: DC

## 2015-10-22 ENCOUNTER — Telehealth: Payer: Self-pay | Admitting: Endocrinology

## 2015-10-22 NOTE — Telephone Encounter (Signed)
The pill that passes through is called a "ghost."  It is what is left--the medication has been absorbed.

## 2015-10-22 NOTE — Telephone Encounter (Signed)
Pt returning your call, he said you may call him back on his cell phone.

## 2015-10-22 NOTE — Telephone Encounter (Signed)
I contacted the patient and advised of note. Pateint voiced understanding and had no further questions at this time

## 2015-10-22 NOTE — Telephone Encounter (Signed)
I contacted the patient and advised due to his recent Fructosamine results we have added farxiga. Pateint voiced understanding, but wanted to advise you he thinks his body is not properly absorbing the metformin. Patient stated some days he will pass the metformin capsule through his stool and is concerned about the medication not benefiting him. Please advise, Thanks!

## 2015-10-25 ENCOUNTER — Other Ambulatory Visit: Payer: Self-pay | Admitting: Emergency Medicine

## 2015-10-28 DIAGNOSIS — Z0279 Encounter for issue of other medical certificate: Secondary | ICD-10-CM

## 2015-11-04 ENCOUNTER — Telehealth: Payer: Self-pay | Admitting: Endocrinology

## 2015-11-04 MED ORDER — EMPAGLIFLOZIN 10 MG PO TABS: 10.0000 mg | ORAL_TABLET | Freq: Every day | ORAL | 11 refills | Status: DC

## 2015-11-04 NOTE — Telephone Encounter (Signed)
I contacted the patient and advised of message via voicemail. Requested a call back if the patient would like to discuss.  

## 2015-11-04 NOTE — Telephone Encounter (Signed)
please call patient: Ins wants you to change farxiga to jardiance.  I have sent a prescription to your pharmacy I'll see you next time.   

## 2015-11-05 NOTE — Telephone Encounter (Signed)
Patient called back and stated the Wilder Glade was approved through his insurance. Patient advised me he would continue taking the farxiga and d/c the jardiance. Patient had no further questions at this time.

## 2015-11-05 NOTE — Telephone Encounter (Signed)
Pt calling to update you on his meds please return call

## 2015-11-05 NOTE — Telephone Encounter (Signed)
Pt returning your call

## 2015-11-05 NOTE — Telephone Encounter (Signed)
Requested a call back from the patient to discuss.  

## 2015-11-13 ENCOUNTER — Telehealth: Payer: Self-pay | Admitting: Behavioral Health

## 2015-11-13 NOTE — Telephone Encounter (Signed)
Left message x 2 for Pre-visit call.

## 2015-11-13 NOTE — Telephone Encounter (Signed)
Unable to reach patient at time of Pre-Visit Call.  Left message for patient to return call when available.    

## 2015-11-13 NOTE — Telephone Encounter (Signed)
Patient returned call for pre-visit.

## 2015-11-16 ENCOUNTER — Ambulatory Visit (INDEPENDENT_AMBULATORY_CARE_PROVIDER_SITE_OTHER): Payer: Federal, State, Local not specified - PPO | Admitting: Family Medicine

## 2015-11-16 ENCOUNTER — Encounter: Payer: Self-pay | Admitting: Family Medicine

## 2015-11-16 VITALS — BP 140/82 | HR 65 | Temp 98.4°F | Ht 72.0 in | Wt 232.6 lb

## 2015-11-16 DIAGNOSIS — I10 Essential (primary) hypertension: Secondary | ICD-10-CM | POA: Diagnosis not present

## 2015-11-16 DIAGNOSIS — R42 Dizziness and giddiness: Secondary | ICD-10-CM | POA: Diagnosis not present

## 2015-11-16 DIAGNOSIS — E119 Type 2 diabetes mellitus without complications: Secondary | ICD-10-CM | POA: Diagnosis not present

## 2015-11-16 NOTE — Patient Instructions (Signed)
It was very nice to see you today-  Please check your glucose 1-2x a day.  Try to check it when you are feeling weak/ dizzy as this may mean that your sugar is low.  If your sugar is less than 100- 120 you may find that a snack gets rid of your symptoms.  I will give you a call in about one week to check on your progress.    You can try cutting your Wilder Glade in half to see if that helps relieve your symptoms as well  We will plan to recheck your A1c is Mid- December
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
## 2015-11-22 ENCOUNTER — Telehealth: Payer: Self-pay | Admitting: Family Medicine

## 2015-11-22 NOTE — Telephone Encounter (Signed)
Seen by myself a week ago due to possible sx of hypoglycemia. The plan was for him to check his glucose when he had sx but he did not realize that his meter did not work.  He got a new one today and will start doing checks, will be in touch with me in a few days

## 2015-11-23 ENCOUNTER — Telehealth: Payer: Self-pay | Admitting: Emergency Medicine

## 2015-11-23 NOTE — Telephone Encounter (Signed)
Pt called in to give PCP recording for todays glucose reading. Pt said fasting this morning it was 126.

## 2015-11-27 NOTE — Telephone Encounter (Signed)
Called and LMOM- that is a good number for a fasting glucose.  Any further episodes of feeling lightheaded?  Please keep me updated

## 2015-12-24 ENCOUNTER — Other Ambulatory Visit: Payer: Self-pay | Admitting: Emergency Medicine

## 2016-01-20 ENCOUNTER — Other Ambulatory Visit: Payer: Self-pay | Admitting: Emergency Medicine

## 2016-01-25 ENCOUNTER — Ambulatory Visit (INDEPENDENT_AMBULATORY_CARE_PROVIDER_SITE_OTHER): Payer: Federal, State, Local not specified - PPO | Admitting: Family Medicine

## 2016-01-25 ENCOUNTER — Encounter: Payer: Self-pay | Admitting: Family Medicine

## 2016-01-25 VITALS — BP 122/72 | HR 64 | Temp 98.4°F | Ht 74.0 in | Wt 230.0 lb

## 2016-01-25 DIAGNOSIS — E785 Hyperlipidemia, unspecified: Secondary | ICD-10-CM

## 2016-01-25 DIAGNOSIS — E119 Type 2 diabetes mellitus without complications: Secondary | ICD-10-CM

## 2016-01-25 DIAGNOSIS — L821 Other seborrheic keratosis: Secondary | ICD-10-CM | POA: Diagnosis not present

## 2016-01-25 DIAGNOSIS — R040 Epistaxis: Secondary | ICD-10-CM

## 2016-01-25 LAB — COMPREHENSIVE METABOLIC PANEL
ALBUMIN: 4.3 g/dL (ref 3.5–5.2)
ALT: 14 U/L (ref 0–53)
AST: 12 U/L (ref 0–37)
Alkaline Phosphatase: 84 U/L (ref 39–117)
BILIRUBIN TOTAL: 0.4 mg/dL (ref 0.2–1.2)
BUN: 13 mg/dL (ref 6–23)
CALCIUM: 9.3 mg/dL (ref 8.4–10.5)
CO2: 27 meq/L (ref 19–32)
CREATININE: 1.04 mg/dL (ref 0.40–1.50)
Chloride: 107 mEq/L (ref 96–112)
GFR: 92.24 mL/min (ref >60.00)
Glucose, Bld: 188 mg/dL — ABNORMAL HIGH (ref 70–99)
Potassium: 3.5 mEq/L (ref 3.5–5.1)
Sodium: 143 mEq/L (ref 135–145)
Total Protein: 7.7 g/dL (ref 6.0–8.3)

## 2016-01-25 LAB — CBC
HCT: 40.3 % (ref 39.0–52.0)
Hemoglobin: 13.3 g/dL (ref 13.0–17.0)
MCHC: 33.1 g/dL (ref 30.0–36.0)
MCV: 83.8 fl (ref 78.0–100.0)
PLATELETS: 182 10*3/uL (ref 150.0–400.0)
RBC: 4.8 Mil/uL (ref 4.22–5.81)
RDW: 13.3 % (ref 11.5–15.5)
WBC: 6.2 10*3/uL (ref 4.0–10.5)

## 2016-01-25 LAB — HEMOGLOBIN A1C: HEMOGLOBIN A1C: 6.9 % — AB (ref 4.6–6.5)

## 2016-01-25 LAB — PROTIME-INR
INR: 1.1 ratio — ABNORMAL HIGH (ref 0.8–1.0)
Prothrombin Time: 12 s (ref 9.6–13.1)

## 2016-01-25 MED ORDER — ATORVASTATIN CALCIUM 10 MG PO TABS: 10.0000 mg | ORAL_TABLET | Freq: Every day | ORAL | 3 refills | Status: DC

## 2016-01-25 NOTE — Progress Notes (Signed)
Pre visit review using our clinic review tool, if applicable. No additional management support is needed unless otherwise documented below in the visit note. 
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
## 2016-01-25 NOTE — Patient Instructions (Signed)
It was a pleasure to see you today We will check your A1c today I will also look for any cause of abnormal bleeding on your labs The bleeding from your nose that you describe is most likely caused by dryness and irritation.  This can be hard to clear up when the air is so dry and cold. Try placing some vaseline or aquaphor in the right nostril and gently rub it in a couple of times a day.  Also avoid blowing your nose for a few days if you can to try and get this healed.    If your symptoms persist please let me know and I will have you see an ENT doctor to take a look inside your nose  We will also look for any cause of abnormal bleeding in your labs today and I will be in touch asap

## 2016-01-26 ENCOUNTER — Encounter: Payer: Self-pay | Admitting: Family Medicine

## 2016-02-01 ENCOUNTER — Other Ambulatory Visit: Payer: Self-pay | Admitting: Emergency Medicine

## 2016-02-07 ENCOUNTER — Other Ambulatory Visit: Payer: Self-pay | Admitting: Family Medicine

## 2016-02-08 ENCOUNTER — Telehealth: Payer: Self-pay | Admitting: Family Medicine

## 2016-02-08 DIAGNOSIS — R040 Epistaxis: Secondary | ICD-10-CM

## 2016-02-08 NOTE — Telephone Encounter (Signed)
Caller name: Relationship to patient: Self Can be reached: [phone]  Pharmacy:  Reason for call: Patient states he is still having recurring nose bleeds and was told to report to Dr. Lorelei Pont if this happened. Wants to know if she is going to refer him to an ENT. Plse adv

## 2016-02-08 NOTE — Telephone Encounter (Signed)
He states that he is still having daily minor nosebleeds The bleeding can come from both sides of his nose No fever or chills He notes that the inside of his nose feels sore He is able to get the bleeding under control easily No other bleeding or bruising noted Will refer to ENT right away- asked him to let me know if any worsening

## 2016-02-09 ENCOUNTER — Other Ambulatory Visit: Payer: Self-pay | Admitting: Emergency Medicine

## 2016-02-09 MED ORDER — PANTOPRAZOLE SODIUM 40 MG PO TBEC: 40.0000 mg | DELAYED_RELEASE_TABLET | Freq: Every day | ORAL | 1 refills | Status: DC

## 2016-02-09 NOTE — Telephone Encounter (Signed)
Received refill request for pantoprazole (PROTONIX) 40 MG tablet. Last office visit 01/25/16 and last refill 06/29/15. Sent refill to pharmacy as requested.

## 2016-02-19 ENCOUNTER — Ambulatory Visit (INDEPENDENT_AMBULATORY_CARE_PROVIDER_SITE_OTHER): Payer: Federal, State, Local not specified - PPO | Admitting: Endocrinology

## 2016-02-19 ENCOUNTER — Telehealth: Payer: Self-pay | Admitting: Internal Medicine

## 2016-02-19 ENCOUNTER — Encounter: Payer: Self-pay | Admitting: Endocrinology

## 2016-02-19 VITALS — BP 122/84 | HR 65 | Ht 74.0 in | Wt 230.0 lb

## 2016-02-19 DIAGNOSIS — E1151 Type 2 diabetes mellitus with diabetic peripheral angiopathy without gangrene: Secondary | ICD-10-CM

## 2016-02-19 MED ORDER — PIOGLITAZONE HCL 15 MG PO TABS: 15.0000 mg | ORAL_TABLET | Freq: Every day | ORAL | 3 refills | Status: DC

## 2016-02-19 NOTE — Telephone Encounter (Signed)
New Message  Pt c/o medication issue:  1. Name of Medication: pioglitazone (ACTOS) 15 mg tablet once daily  2. How are you currently taking this medication (dosage and times per day)? See above  3. Are you having a reaction (difficulty breathing--STAT)? N/A  4. What is your medication issue? Pt voiced wondering if it's okay to take this medication and it states it could cause heart attack and he is concerned.  Please f/u
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
## 2016-02-19 NOTE — Telephone Encounter (Signed)
Actos should be avoided in patients with heart failure because it can cause worsening of HF symptoms and exacerbations. Pt does not have a history of heart failure and therefore no contraindication to its use. Will defer to Dr Harrington Challenger for input as well.

## 2016-02-19 NOTE — Patient Instructions (Addendum)
I have sent a prescription to your pharmacy, to add "pioglitizone."  check your blood sugar once a day.  vary the time of day when you check, between before the 3 meals, and at bedtime.  also check if you have symptoms of your blood sugar being too high or too low.  please keep a record of the readings and bring it to your next appointment here (or you can bring the meter itself).  You can write it on any piece of paper.  please call us sooner if your blood sugar goes below 70, or if you have a lot of readings over 200. Please come back for a follow-up appointment in 3 months.

## 2016-02-22 NOTE — Telephone Encounter (Signed)
OK for pt to receive  Primary MD to prescribe

## 2016-02-22 NOTE — Telephone Encounter (Signed)
LM on private vm that Dr Harrington Challenger cleared pt to take Actos and also pharmacist reviewed as well

## 2016-03-12 ENCOUNTER — Other Ambulatory Visit: Payer: Self-pay | Admitting: Physician Assistant

## 2016-03-12 DIAGNOSIS — J309 Allergic rhinitis, unspecified: Secondary | ICD-10-CM

## 2016-03-18 ENCOUNTER — Other Ambulatory Visit: Payer: Self-pay | Admitting: Emergency Medicine

## 2016-03-21 ENCOUNTER — Other Ambulatory Visit: Payer: Self-pay | Admitting: Family Medicine

## 2016-03-22 ENCOUNTER — Other Ambulatory Visit: Payer: Self-pay | Admitting: Emergency Medicine

## 2016-03-31 ENCOUNTER — Encounter: Payer: Self-pay | Admitting: Medical

## 2016-03-31 ENCOUNTER — Ambulatory Visit (INDEPENDENT_AMBULATORY_CARE_PROVIDER_SITE_OTHER): Payer: Federal, State, Local not specified - PPO | Admitting: Medical

## 2016-03-31 VITALS — BP 174/68 | HR 67 | Temp 98.0°F | Resp 16 | Ht 74.0 in | Wt 230.0 lb

## 2016-03-31 DIAGNOSIS — E119 Type 2 diabetes mellitus without complications: Secondary | ICD-10-CM

## 2016-03-31 DIAGNOSIS — B49 Unspecified mycosis: Secondary | ICD-10-CM

## 2016-03-31 DIAGNOSIS — I10 Essential (primary) hypertension: Secondary | ICD-10-CM | POA: Diagnosis not present

## 2016-03-31 MED ORDER — MUPIROCIN 2 % EX OINT: TOPICAL_OINTMENT | CUTANEOUS | 0 refills | Status: DC

## 2016-03-31 MED ORDER — NYSTATIN 100000 UNIT/GM EX CREA: 1.0000 "application " | TOPICAL_CREAM | Freq: Two times a day (BID) | CUTANEOUS | 0 refills | Status: DC

## 2016-03-31 NOTE — Progress Notes (Signed)
Pre visit review using our clinic review tool, if applicable. No additional management support is needed unless otherwise documented below in the visit note/SLS  
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
## 2016-03-31 NOTE — Patient Instructions (Addendum)
In between your 1st and 2nd toes appears you have some mild fungal infection but no ulcer or breakdown. I will rx nystatin to use twice daily. And mupirocin to use mid day.   If you see any breakdown or ulcers that forms let us know return  Otherwise if doing well then follow up in 10 days.  Also advised to take bp medication today/asap(counseled since he did not take toay and bp high)

## 2016-04-07 ENCOUNTER — Ambulatory Visit (INDEPENDENT_AMBULATORY_CARE_PROVIDER_SITE_OTHER): Payer: Federal, State, Local not specified - PPO | Admitting: Family Medicine

## 2016-04-07 ENCOUNTER — Encounter: Payer: Self-pay | Admitting: Family Medicine

## 2016-04-07 VITALS — BP 158/77 | HR 69 | Temp 98.0°F | Ht 74.0 in

## 2016-04-07 DIAGNOSIS — B353 Tinea pedis: Secondary | ICD-10-CM | POA: Diagnosis not present

## 2016-04-07 NOTE — Patient Instructions (Addendum)
It looks like your feet are doing great!  Continue to use your cream for another week or so and let me know if you have any other concerns   Please see me in about 2 months to check on your blood sugar
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
## 2016-04-14 ENCOUNTER — Other Ambulatory Visit: Payer: Self-pay | Admitting: Internal Medicine

## 2016-04-26 ENCOUNTER — Encounter: Payer: Self-pay | Admitting: Internal Medicine

## 2016-04-27 ENCOUNTER — Other Ambulatory Visit: Payer: Self-pay | Admitting: Physician Assistant

## 2016-04-27 DIAGNOSIS — I10 Essential (primary) hypertension: Secondary | ICD-10-CM

## 2016-05-04 ENCOUNTER — Other Ambulatory Visit: Payer: Self-pay | Admitting: Emergency Medicine

## 2016-05-04 ENCOUNTER — Other Ambulatory Visit: Payer: Self-pay | Admitting: Physician Assistant

## 2016-05-13 ENCOUNTER — Encounter: Payer: Self-pay | Admitting: Internal Medicine

## 2016-05-13 ENCOUNTER — Other Ambulatory Visit: Payer: Self-pay | Admitting: Internal Medicine

## 2016-05-13 ENCOUNTER — Ambulatory Visit (INDEPENDENT_AMBULATORY_CARE_PROVIDER_SITE_OTHER): Payer: Medicare Other | Admitting: Internal Medicine

## 2016-05-13 ENCOUNTER — Encounter (INDEPENDENT_AMBULATORY_CARE_PROVIDER_SITE_OTHER): Payer: Self-pay

## 2016-05-13 VITALS — BP 148/92 | HR 62 | Ht 74.0 in | Wt 234.6 lb

## 2016-05-13 DIAGNOSIS — E785 Hyperlipidemia, unspecified: Secondary | ICD-10-CM | POA: Diagnosis not present

## 2016-05-13 DIAGNOSIS — I10 Essential (primary) hypertension: Secondary | ICD-10-CM | POA: Diagnosis not present

## 2016-05-13 DIAGNOSIS — G459 Transient cerebral ischemic attack, unspecified: Secondary | ICD-10-CM | POA: Diagnosis not present

## 2016-05-13 MED ORDER — ATORVASTATIN CALCIUM 20 MG PO TABS: 20.0000 mg | ORAL_TABLET | Freq: Every day | ORAL | 3 refills | Status: DC

## 2016-05-13 NOTE — Patient Instructions (Signed)
Your physician has recommended you make the following change in your medication:  1.) increase lipitor (atorvastatin) to 20 mg daily   Your physician wants you to follow-up in: 1 year with Dr. Harrington Challenger.  You will receive a reminder letter in the mail two months in advance. If you don't receive a letter, please call our office to schedule the follow-up appointment.
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
## 2016-05-18 ENCOUNTER — Ambulatory Visit (INDEPENDENT_AMBULATORY_CARE_PROVIDER_SITE_OTHER): Payer: Medicare Other | Admitting: Endocrinology

## 2016-05-18 VITALS — BP 150/80 | HR 64 | Ht 74.0 in | Wt 233.0 lb

## 2016-05-18 DIAGNOSIS — E1151 Type 2 diabetes mellitus with diabetic peripheral angiopathy without gangrene: Secondary | ICD-10-CM

## 2016-05-18 LAB — POCT GLYCOSYLATED HEMOGLOBIN (HGB A1C): HEMOGLOBIN A1C: 6.8
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
## 2016-05-18 NOTE — Patient Instructions (Addendum)
Please continue the same medications for diabetes.  check your blood sugar once a day.  vary the time of day when you check, between before the 3 meals, and at bedtime.  also check if you have symptoms of your blood sugar being too high or too low.  please keep a record of the readings and bring it to your next appointment here (or you can bring the meter itself).  You can write it on any piece of paper.  please call us sooner if your blood sugar goes below 70, or if you have a lot of readings over 200.   Please come back for a follow-up appointment in 4 months.   

## 2016-06-01 ENCOUNTER — Other Ambulatory Visit: Payer: Self-pay | Admitting: Physician Assistant

## 2016-06-01 NOTE — Telephone Encounter (Signed)
Hi Dr. Lorelei Pont.  Patient has named you his PCP. Former Dr. Everlene Farrier patient. Philis Fendt, MS, PA-C 9:12 AM, 06/01/2016

## 2016-07-05 ENCOUNTER — Encounter: Payer: Self-pay | Admitting: Adult Health

## 2016-07-05 ENCOUNTER — Ambulatory Visit (INDEPENDENT_AMBULATORY_CARE_PROVIDER_SITE_OTHER): Payer: Medicare Other | Admitting: Adult Health

## 2016-07-05 VITALS — BP 139/82 | HR 58 | Ht 74.0 in | Wt 235.0 lb

## 2016-07-05 DIAGNOSIS — R209 Unspecified disturbances of skin sensation: Secondary | ICD-10-CM | POA: Diagnosis not present

## 2016-07-05 DIAGNOSIS — Z5181 Encounter for therapeutic drug level monitoring: Secondary | ICD-10-CM | POA: Diagnosis not present

## 2016-07-05 NOTE — Patient Instructions (Signed)
Continue Carbamazepine Blood work today If your symptoms worsen or you develop new symptoms please let us know.
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
## 2016-07-06 ENCOUNTER — Telehealth: Payer: Self-pay | Admitting: *Deleted

## 2016-07-06 LAB — CBC WITH DIFFERENTIAL/PLATELET
BASOS: 1 %
Basophils Absolute: 0 10*3/uL (ref 0.0–0.2)
EOS (ABSOLUTE): 0.1 10*3/uL (ref 0.0–0.4)
EOS: 2 %
HEMATOCRIT: 39.6 % (ref 37.5–51.0)
HEMOGLOBIN: 12.9 g/dL — AB (ref 13.0–17.7)
IMMATURE GRANS (ABS): 0 10*3/uL (ref 0.0–0.1)
IMMATURE GRANULOCYTES: 0 %
LYMPHS: 38 %
Lymphocytes Absolute: 2 10*3/uL (ref 0.7–3.1)
MCH: 27.4 pg (ref 26.6–33.0)
MCHC: 32.6 g/dL (ref 31.5–35.7)
MCV: 84 fL (ref 79–97)
Monocytes Absolute: 0.3 10*3/uL (ref 0.1–0.9)
Monocytes: 6 %
Neutrophils Absolute: 2.8 10*3/uL (ref 1.4–7.0)
Neutrophils: 53 %
Platelets: 170 10*3/uL (ref 150–379)
RBC: 4.7 x10E6/uL (ref 4.14–5.80)
RDW: 14.1 % (ref 12.3–15.4)
WBC: 5.3 10*3/uL (ref 3.4–10.8)

## 2016-07-06 LAB — COMPREHENSIVE METABOLIC PANEL
A/G RATIO: 1.5 (ref 1.2–2.2)
ALT: 17 IU/L (ref 0–44)
AST: 15 IU/L (ref 0–40)
Albumin: 4.6 g/dL (ref 3.6–4.8)
Alkaline Phosphatase: 88 IU/L (ref 39–117)
BILIRUBIN TOTAL: 0.3 mg/dL (ref 0.0–1.2)
BUN / CREAT RATIO: 16 (ref 10–24)
BUN: 17 mg/dL (ref 8–27)
CHLORIDE: 106 mmol/L (ref 96–106)
CO2: 25 mmol/L (ref 20–29)
Calcium: 9.3 mg/dL (ref 8.6–10.2)
Creatinine, Ser: 1.08 mg/dL (ref 0.76–1.27)
GFR calc non Af Amer: 72 mL/min/{1.73_m2} (ref >59)
GFR, EST AFRICAN AMERICAN: 83 mL/min/{1.73_m2} (ref >59)
Globulin, Total: 3.1 g/dL (ref 1.5–4.5)
Glucose: 126 mg/dL — ABNORMAL HIGH (ref 65–99)
Potassium: 4.3 mmol/L (ref 3.5–5.2)
Sodium: 144 mmol/L (ref 134–144)
TOTAL PROTEIN: 7.7 g/dL (ref 6.0–8.5)

## 2016-07-06 LAB — CARBAMAZEPINE LEVEL, TOTAL: Carbamazepine (Tegretol), S: 7 ug/mL (ref 4.0–12.0)

## 2016-07-06 NOTE — Progress Notes (Signed)
I agree with the assessment and plan as directed by NP .The patient is known to me .   Britnay Magnussen, MD  

## 2016-07-06 NOTE — Telephone Encounter (Signed)
-----   Message from Ward Givens, NP sent at 07/06/2016  7:19 AM EDT ----- Lab work unremarkable. Please call patient

## 2016-07-06 NOTE — Telephone Encounter (Signed)
Called and LVM for pt about unremarkable labs per MM,NP note. Gave [facility] phone number if he has further questions or concerns.

## 2016-07-25 ENCOUNTER — Other Ambulatory Visit: Payer: Self-pay | Admitting: Emergency Medicine

## 2016-07-25 DIAGNOSIS — I10 Essential (primary) hypertension: Secondary | ICD-10-CM

## 2016-08-02 ENCOUNTER — Other Ambulatory Visit: Payer: Self-pay | Admitting: Family Medicine

## 2016-08-04 ENCOUNTER — Other Ambulatory Visit: Payer: Self-pay | Admitting: Emergency Medicine

## 2016-08-04 DIAGNOSIS — I10 Essential (primary) hypertension: Secondary | ICD-10-CM

## 2016-08-05 NOTE — Telephone Encounter (Signed)
PT seen J. Copland MD Forward to her clinlic

## 2016-09-05 ENCOUNTER — Other Ambulatory Visit: Payer: Self-pay | Admitting: Neurology

## 2016-09-05 ENCOUNTER — Other Ambulatory Visit: Payer: Self-pay | Admitting: Emergency Medicine

## 2016-09-06 ENCOUNTER — Other Ambulatory Visit: Payer: Self-pay | Admitting: Family Medicine

## 2016-09-14 DIAGNOSIS — E119 Type 2 diabetes mellitus without complications: Secondary | ICD-10-CM | POA: Diagnosis not present

## 2016-09-14 DIAGNOSIS — H401121 Primary open-angle glaucoma, left eye, mild stage: Secondary | ICD-10-CM | POA: Diagnosis not present

## 2016-09-14 DIAGNOSIS — H2512 Age-related nuclear cataract, left eye: Secondary | ICD-10-CM | POA: Diagnosis not present

## 2016-09-14 DIAGNOSIS — H35032 Hypertensive retinopathy, left eye: Secondary | ICD-10-CM | POA: Diagnosis not present

## 2016-09-14 DIAGNOSIS — H35362 Drusen (degenerative) of macula, left eye: Secondary | ICD-10-CM | POA: Diagnosis not present

## 2016-09-18 ENCOUNTER — Other Ambulatory Visit: Payer: Self-pay | Admitting: Emergency Medicine

## 2016-09-20 ENCOUNTER — Ambulatory Visit (INDEPENDENT_AMBULATORY_CARE_PROVIDER_SITE_OTHER): Payer: Medicare Other | Admitting: Endocrinology

## 2016-09-20 ENCOUNTER — Encounter: Payer: Self-pay | Admitting: Endocrinology

## 2016-09-20 VITALS — BP 142/92 | HR 65 | Wt 235.8 lb

## 2016-09-20 DIAGNOSIS — E1151 Type 2 diabetes mellitus with diabetic peripheral angiopathy without gangrene: Secondary | ICD-10-CM | POA: Diagnosis not present

## 2016-09-20 LAB — POCT GLYCOSYLATED HEMOGLOBIN (HGB A1C): Hemoglobin A1C: 6.4

## 2016-09-20 MED ORDER — SITAGLIPTIN PHOSPHATE 100 MG PO TABS: 50.0000 mg | ORAL_TABLET | Freq: Every day | ORAL | 11 refills | Status: DC

## 2016-09-20 MED ORDER — DAPAGLIFLOZIN PROPANEDIOL 10 MG PO TABS: 5.0000 mg | ORAL_TABLET | Freq: Every day | ORAL | 11 refills | Status: DC

## 2016-09-20 NOTE — Patient Instructions (Signed)
I have sent a prescriptions to your pharmacy, so you can cut the [place] and farxiga in half, to save money.  check your blood sugar once a day.  vary the time of day when you check, between before the 3 meals, and at bedtime.  also check if you have symptoms of your blood sugar being too high or too low.  please keep a record of the readings and bring it to your next appointment here (or you can bring the meter itself).  You can write it on any piece of paper.  please call us sooner if your blood sugar goes below 70, or if you have a lot of readings over 200. Please come back for a follow-up appointment in 4 months.
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
## 2016-09-22 NOTE — Telephone Encounter (Signed)
Andrew [name] patient; will forward rx refill request.

## 2016-09-27 ENCOUNTER — Telehealth: Payer: Self-pay | Admitting: Endocrinology

## 2016-09-27 NOTE — Telephone Encounter (Signed)
Is the patient on the 5mg  or the 10mg  for the dapagliflozin propanediol (FARXIGA) 10 MG TABS tablet  ? Call patient to advise.   [facility] [place], [place] [phone] (Phone) [phone] (Fax)

## 2016-09-27 NOTE — Telephone Encounter (Signed)
Called patient and clarified

## 2016-09-27 NOTE — Telephone Encounter (Signed)
Routing to you °

## 2016-10-05 ENCOUNTER — Ambulatory Visit (INDEPENDENT_AMBULATORY_CARE_PROVIDER_SITE_OTHER): Payer: Medicare Other | Admitting: Family Medicine

## 2016-10-05 ENCOUNTER — Encounter: Payer: Self-pay | Admitting: Family Medicine

## 2016-10-05 VITALS — BP 145/87 | HR 61 | Temp 97.8°F | Ht 74.0 in | Wt 235.2 lb

## 2016-10-05 DIAGNOSIS — E785 Hyperlipidemia, unspecified: Secondary | ICD-10-CM | POA: Diagnosis not present

## 2016-10-05 DIAGNOSIS — E1169 Type 2 diabetes mellitus with other specified complication: Secondary | ICD-10-CM

## 2016-10-05 DIAGNOSIS — E119 Type 2 diabetes mellitus without complications: Secondary | ICD-10-CM

## 2016-10-05 DIAGNOSIS — Z23 Encounter for immunization: Secondary | ICD-10-CM

## 2016-10-05 DIAGNOSIS — I10 Essential (primary) hypertension: Secondary | ICD-10-CM

## 2016-10-05 LAB — LIPID PANEL
CHOL/HDL RATIO: 4
Cholesterol: 154 mg/dL (ref 0–200)
HDL: 38.7 mg/dL — AB (ref >39.00)
LDL CALC: 86 mg/dL (ref 0–99)
NONHDL: 115.32
Triglycerides: 145 mg/dL (ref 0.0–149.0)
VLDL: 29 mg/dL (ref 0.0–40.0)

## 2016-10-05 NOTE — Patient Instructions (Signed)
It was good to see you today!  You got your flu shot and pneumovax 23 booster today Take care and I will be in touch with your labs We can plan to visit in about 6 months- have a wonderful fall, I hope that you get to play plenty of golf
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
## 2016-11-03 ENCOUNTER — Other Ambulatory Visit: Payer: Self-pay | Admitting: Emergency Medicine

## 2016-11-03 MED ORDER — PANTOPRAZOLE SODIUM 40 MG PO TBEC: DELAYED_RELEASE_TABLET | ORAL | 0 refills | Status: DC

## 2016-11-07 NOTE — Telephone Encounter (Signed)
Pt called says pharmacy told him to call Copland to see if she still wants him to take generic Protonix. Please advise pt.

## 2016-11-08 NOTE — Telephone Encounter (Signed)
Called pt- he has been on protonix for GERD for a long time, never tried to come off it. He will try taking a break from this medication and see if he still needs it - if so he will let me know.  The pharm did not fill the 90 days I sent in last week for some reason -?insurnace change

## 2016-11-10 ENCOUNTER — Telehealth: Payer: Self-pay | Admitting: Emergency Medicine

## 2016-11-10 NOTE — Telephone Encounter (Signed)
Pt wanted to informed provider that after stopping pantoprazole (PROTONIX) for a couple of days his GERD "came back strong". Pt states that he had to purchase Nexium 20 mg over the counter and take 2 tablets (total 40 mg) daily. Pt states that this is working well and just wanted provider to know.

## 2016-11-11 ENCOUNTER — Telehealth: Payer: Self-pay

## 2016-11-11 NOTE — Telephone Encounter (Signed)
PA initiated via Covermymeds; KEY: [id number]. Received real-time PA approval. Effective 10/12/2016 through 11/11/2017.

## 2016-11-21 ENCOUNTER — Telehealth: Payer: Self-pay | Admitting: Endocrinology

## 2016-11-21 ENCOUNTER — Other Ambulatory Visit: Payer: Self-pay

## 2016-11-21 MED ORDER — DAPAGLIFLOZIN PROPANEDIOL 10 MG PO TABS: 5.0000 mg | ORAL_TABLET | Freq: Every day | ORAL | 11 refills | Status: DC

## 2016-11-21 NOTE — Telephone Encounter (Signed)
Pt is without the farxiga and has been without for 3 days. He needs this called in soon please he is currently taking 5 mg daily

## 2016-11-21 NOTE — Telephone Encounter (Signed)
Sent to phamacy 

## 2016-12-02 ENCOUNTER — Other Ambulatory Visit: Payer: Self-pay | Admitting: Adult Health

## 2016-12-13 ENCOUNTER — Other Ambulatory Visit: Payer: Self-pay

## 2016-12-13 ENCOUNTER — Telehealth: Payer: Self-pay | Admitting: *Deleted

## 2016-12-13 NOTE — Telephone Encounter (Signed)
Patient called and states he needs clarification on his RX for Januvia. Patient is requesting a call back. His contact # is [phone]. Please advise. Thank you

## 2016-12-13 NOTE — Telephone Encounter (Signed)
Called and clarified with patient.

## 2016-12-22 ENCOUNTER — Other Ambulatory Visit: Payer: Self-pay | Admitting: Emergency Medicine

## 2016-12-22 MED ORDER — OLMESARTAN MEDOXOMIL 40 MG PO TABS: ORAL_TABLET | ORAL | 1 refills | Status: DC

## 2017-01-11 ENCOUNTER — Other Ambulatory Visit: Payer: Self-pay | Admitting: Family Medicine

## 2017-01-11 DIAGNOSIS — E785 Hyperlipidemia, unspecified: Secondary | ICD-10-CM

## 2017-01-20 ENCOUNTER — Encounter: Payer: Self-pay | Admitting: Endocrinology

## 2017-01-20 ENCOUNTER — Ambulatory Visit (INDEPENDENT_AMBULATORY_CARE_PROVIDER_SITE_OTHER): Payer: Medicare Other | Admitting: Endocrinology

## 2017-01-20 VITALS — BP 120/80 | HR 64 | Temp 98.5°F | Wt 237.0 lb

## 2017-01-20 DIAGNOSIS — E1151 Type 2 diabetes mellitus with diabetic peripheral angiopathy without gangrene: Secondary | ICD-10-CM | POA: Diagnosis not present

## 2017-01-20 LAB — POCT GLYCOSYLATED HEMOGLOBIN (HGB A1C): HEMOGLOBIN A1C: 6.1

## 2017-01-20 NOTE — Patient Instructions (Addendum)
Please continue the same medications check your blood sugar once a day.  vary the time of day when you check, between before the 3 meals, and at bedtime.  also check if you have symptoms of your blood sugar being too high or too low.  please keep a record of the readings and bring it to your next appointment here (or you can bring the meter itself).  You can write it on any piece of paper.  please call us sooner if your blood sugar goes below 70, or if you have a lot of readings over 200. Please come back for a follow-up appointment in 6 months.   
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
## 2017-02-09 ENCOUNTER — Other Ambulatory Visit: Payer: Self-pay | Admitting: Endocrinology

## 2017-02-14 ENCOUNTER — Other Ambulatory Visit: Payer: Self-pay | Admitting: Family Medicine

## 2017-02-27 ENCOUNTER — Other Ambulatory Visit: Payer: Self-pay | Admitting: Family Medicine

## 2017-02-27 DIAGNOSIS — J309 Allergic rhinitis, unspecified: Secondary | ICD-10-CM

## 2017-03-20 DIAGNOSIS — H401121 Primary open-angle glaucoma, left eye, mild stage: Secondary | ICD-10-CM | POA: Diagnosis not present

## 2017-03-20 DIAGNOSIS — H40039 Anatomical narrow angle, unspecified eye: Secondary | ICD-10-CM | POA: Diagnosis not present

## 2017-03-20 DIAGNOSIS — H40052 Ocular hypertension, left eye: Secondary | ICD-10-CM | POA: Diagnosis not present

## 2017-03-20 DIAGNOSIS — Q111 Other anophthalmos: Secondary | ICD-10-CM | POA: Diagnosis not present

## 2017-03-30 ENCOUNTER — Other Ambulatory Visit: Payer: Self-pay | Admitting: Family Medicine

## 2017-03-30 DIAGNOSIS — J309 Allergic rhinitis, unspecified: Secondary | ICD-10-CM
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
## 2017-04-05 ENCOUNTER — Ambulatory Visit (INDEPENDENT_AMBULATORY_CARE_PROVIDER_SITE_OTHER): Payer: Medicare Other | Admitting: Family Medicine

## 2017-04-05 ENCOUNTER — Encounter: Payer: Self-pay | Admitting: Family Medicine

## 2017-04-05 VITALS — BP 132/82 | HR 61 | Temp 97.9°F | Ht 74.0 in | Wt 235.8 lb

## 2017-04-05 DIAGNOSIS — E785 Hyperlipidemia, unspecified: Secondary | ICD-10-CM

## 2017-04-05 DIAGNOSIS — L989 Disorder of the skin and subcutaneous tissue, unspecified: Secondary | ICD-10-CM

## 2017-04-05 DIAGNOSIS — E1169 Type 2 diabetes mellitus with other specified complication: Secondary | ICD-10-CM

## 2017-04-05 DIAGNOSIS — Z125 Encounter for screening for malignant neoplasm of prostate: Secondary | ICD-10-CM | POA: Diagnosis not present

## 2017-04-05 DIAGNOSIS — I10 Essential (primary) hypertension: Secondary | ICD-10-CM

## 2017-04-05 LAB — CBC
HCT: 40 % (ref 39.0–52.0)
Hemoglobin: 13 g/dL (ref 13.0–17.0)
MCHC: 32.5 g/dL (ref 30.0–36.0)
MCV: 86.6 fl (ref 78.0–100.0)
Platelets: 177 10*3/uL (ref 150.0–400.0)
RBC: 4.62 Mil/uL (ref 4.22–5.81)
RDW: 13.4 % (ref 11.5–15.5)
WBC: 5.9 10*3/uL (ref 4.0–10.5)

## 2017-04-05 LAB — COMPREHENSIVE METABOLIC PANEL
ALK PHOS: 90 U/L (ref 39–117)
ALT: 13 U/L (ref 0–53)
AST: 13 U/L (ref 0–37)
Albumin: 4.2 g/dL (ref 3.5–5.2)
BILIRUBIN TOTAL: 0.4 mg/dL (ref 0.2–1.2)
BUN: 15 mg/dL (ref 6–23)
CO2: 27 mEq/L (ref 19–32)
CREATININE: 0.98 mg/dL (ref 0.40–1.50)
Calcium: 9.2 mg/dL (ref 8.4–10.5)
Chloride: 109 mEq/L (ref 96–112)
GFR: 98.43 mL/min (ref >60.00)
GLUCOSE: 137 mg/dL — AB (ref 70–99)
Potassium: 3.9 mEq/L (ref 3.5–5.1)
SODIUM: 146 meq/L — AB (ref 135–145)
TOTAL PROTEIN: 7.5 g/dL (ref 6.0–8.3)

## 2017-04-05 LAB — PSA, MEDICARE: PSA: 2.68 ng/ml (ref 0.10–4.00)

## 2017-04-05 NOTE — Patient Instructions (Signed)
A pleasure to see you today as always!  I will be in touch with your labs and will set you up to see dermatology Please see me in about 6 months unless you need anything sooner

## 2017-04-10 ENCOUNTER — Encounter: Payer: Self-pay | Admitting: Physician Assistant

## 2017-04-10 ENCOUNTER — Other Ambulatory Visit: Payer: Self-pay | Admitting: Internal Medicine

## 2017-04-10 ENCOUNTER — Other Ambulatory Visit: Payer: Self-pay | Admitting: Family Medicine

## 2017-04-10 DIAGNOSIS — E785 Hyperlipidemia, unspecified: Secondary | ICD-10-CM

## 2017-04-24 ENCOUNTER — Ambulatory Visit: Payer: Self-pay | Admitting: *Deleted

## 2017-04-24 DIAGNOSIS — H401121 Primary open-angle glaucoma, left eye, mild stage: Secondary | ICD-10-CM | POA: Diagnosis not present

## 2017-04-24 DIAGNOSIS — H40052 Ocular hypertension, left eye: Secondary | ICD-10-CM | POA: Diagnosis not present

## 2017-04-24 DIAGNOSIS — H40039 Anatomical narrow angle, unspecified eye: Secondary | ICD-10-CM | POA: Diagnosis not present

## 2017-04-24 DIAGNOSIS — Q111 Other anophthalmos: Secondary | ICD-10-CM | POA: Diagnosis not present

## 2017-04-24 NOTE — Telephone Encounter (Signed)
Pt has a  History  Of    dizzyness   And  Symptoms  Like  This  In past .  He  States  He  Say  His  Eye  Doctor  This  Am and  His  Eye  Pressure  Was normal  And  His  Bp  Was  128/78. He  Is  Awake  And  Alert   And  Oriented . Appointment  Made  For  This  Wednesday  April 17  With Dr Edilia Bo. Precautions  Given   Reason for Disposition . [1] MODERATE dizziness (e.g., vertigo; feels very unsteady, interferes with normal activities) AND [2] has been evaluated by physician for this  Answer Assessment - Initial Assessment Questions 1. DESCRIPTION: "Describe your dizziness."      When  Stand  Up  And   Walking   Balance   Is  Bad     2. VERTIGO: "Do you feel like either you or the room is spinning or tilting?"        No    Feels  Like  Is  Going to  Fall    3. LIGHTHEADED: "Do you feel lightheaded?" (e.g., somewhat faint, woozy, weak upon standing)      Weak   In legs      4. SEVERITY: "How bad is it?"  "Can you walk?"   - MILD - Feels unsteady but walking normally.   - MODERATE - Feels very unsteady when walking, but not falling; interferes with normal activities (e.g., school, work) .   - SEVERE - Unable to walk without falling (requires assistance).      Moderate    5. ONSET:  "When did the dizziness begin?"       4  And  1/2  Days   6. AGGRAVATING FACTORS: "Does anything make it worse?" (e.g., standing, change in head position)      Walking  And  Standing  Makes  It  Worse         7. CAUSE: "What do you think is causing the dizziness?"        No  Clue     8. RECURRENT SYMPTOM: "Have you had dizziness before?" If so, ask: "When was the last time?" "What happened that time?"     Pt   Reports  Has   Had   Something  Similar  About  1  Year  Ago    9. OTHER SYMPTOMS: "Do you have any other symptoms?" (e.g., headache, weakness, numbness, vomiting, earache)       Weakness  When  Stands -    10. PREGNANCY: "Is there any chance you are pregnant?" "When was your last menstrual period?"    N/a  Protocols used: DIZZINESS - VERTIGO-A-AH

## 2017-04-24 NOTE — Telephone Encounter (Signed)
Pt  Also  Advised  He  Should  Be  Taking the  Lipitor  As  Prescribed  By  Dr Lorelei Pont    On last  Visit  10  Mg
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
## 2017-04-26 ENCOUNTER — Ambulatory Visit
Admission: RE | Admit: 2017-04-26 | Discharge: 2017-04-26 | Disposition: A | Discharge status: Home / Self Care | Payer: Medicare Other | Source: Ambulatory Visit | Attending: Family Medicine | Admitting: Family Medicine

## 2017-04-26 ENCOUNTER — Ambulatory Visit (INDEPENDENT_AMBULATORY_CARE_PROVIDER_SITE_OTHER): Payer: Medicare Other | Admitting: Family Medicine

## 2017-04-26 ENCOUNTER — Telehealth: Payer: Self-pay | Admitting: Neurology

## 2017-04-26 ENCOUNTER — Encounter: Payer: Self-pay | Admitting: Family Medicine

## 2017-04-26 VITALS — BP 138/72 | HR 69 | Temp 97.9°F | Ht 74.0 in | Wt 234.2 lb

## 2017-04-26 DIAGNOSIS — R269 Unspecified abnormalities of gait and mobility: Secondary | ICD-10-CM | POA: Diagnosis not present

## 2017-04-26 DIAGNOSIS — R0989 Other specified symptoms and signs involving the circulatory and respiratory systems: Secondary | ICD-10-CM

## 2017-04-26 DIAGNOSIS — G459 Transient cerebral ischemic attack, unspecified: Secondary | ICD-10-CM | POA: Diagnosis not present

## 2017-04-26 DIAGNOSIS — E119 Type 2 diabetes mellitus without complications: Secondary | ICD-10-CM

## 2017-04-26 DIAGNOSIS — R42 Dizziness and giddiness: Secondary | ICD-10-CM

## 2017-04-26 DIAGNOSIS — E785 Hyperlipidemia, unspecified: Secondary | ICD-10-CM | POA: Diagnosis not present

## 2017-04-26 LAB — GLUCOSE, POCT (MANUAL RESULT ENTRY): POC GLUCOSE: 193 mg/dL — AB (ref 70–99)

## 2017-04-26 NOTE — Telephone Encounter (Signed)
I spoke with Dr. Edilia Bo about [name].  He was having episodes of numbness similar to a TIA he had in the past.  She will order an MRI of the brain and carotid ultrasound.  I advised that he should follow-up in our office after those tests.

## 2017-04-26 NOTE — Patient Instructions (Addendum)
Good to see you today.  I suspect that your symptoms are caused by BPPV, which is a benign type of vertigo related to your middle ear .  However, to be on the safe side I will arrange an MRI of your brain, and also am going to get an ultrasound of your neck to check for any blockage of your carotid artery  Please let me know if any changes or worsening of your symptoms in the meantime, or if you do not get an appointment for your MRI in the next couple of days

## 2017-04-27 ENCOUNTER — Encounter: Payer: Self-pay | Admitting: Family Medicine

## 2017-04-27 ENCOUNTER — Other Ambulatory Visit: Payer: Self-pay | Admitting: Family Medicine

## 2017-04-27 DIAGNOSIS — R0989 Other specified symptoms and signs involving the circulatory and respiratory systems: Secondary | ICD-10-CM

## 2017-04-28 ENCOUNTER — Ambulatory Visit (HOSPITAL_COMMUNITY)
Admission: RE | Admit: 2017-04-28 | Discharge: 2017-04-28 | Disposition: A | Discharge status: Home / Self Care | Payer: Medicare Other | Source: Ambulatory Visit | Attending: Family Medicine | Admitting: Family Medicine

## 2017-04-28 DIAGNOSIS — R0989 Other specified symptoms and signs involving the circulatory and respiratory systems: Secondary | ICD-10-CM

## 2017-04-28 NOTE — Progress Notes (Signed)
Preliminary notes --- Bilateral carotid duplex study completed. Bilateral vertebral arteries antegrade flow.  Dampened flow pattern on ICA prox bilaterally.   Andrew [name] (RDMS RVT) 04/28/17 10:54 AM

## 2017-04-29 ENCOUNTER — Other Ambulatory Visit: Payer: Self-pay | Admitting: Internal Medicine

## 2017-05-01 MED ORDER — ATORVASTATIN CALCIUM 20 MG PO TABS: ORAL_TABLET | ORAL | 3 refills | Status: DC

## 2017-05-01 NOTE — Addendum Note (Signed)
Addended by: Lamar Blinks C on: 05/01/2017 09:50 AM   Modules accepted: Orders

## 2017-05-12 ENCOUNTER — Other Ambulatory Visit: Payer: Self-pay | Admitting: Endocrinology

## 2017-05-14 ENCOUNTER — Other Ambulatory Visit: Payer: Self-pay | Admitting: Family Medicine

## 2017-05-21 ENCOUNTER — Other Ambulatory Visit: Payer: Self-pay | Admitting: Family Medicine

## 2017-06-02 ENCOUNTER — Other Ambulatory Visit: Payer: Self-pay | Admitting: Family Medicine

## 2017-06-06 ENCOUNTER — Encounter: Payer: Self-pay | Admitting: Internal Medicine

## 2017-06-06 ENCOUNTER — Ambulatory Visit (INDEPENDENT_AMBULATORY_CARE_PROVIDER_SITE_OTHER): Payer: Medicare Other | Admitting: Internal Medicine

## 2017-06-06 VITALS — BP 114/78 | HR 61 | Ht 74.0 in | Wt 236.8 lb

## 2017-06-06 DIAGNOSIS — R42 Dizziness and giddiness: Secondary | ICD-10-CM | POA: Diagnosis not present

## 2017-06-06 DIAGNOSIS — I10 Essential (primary) hypertension: Secondary | ICD-10-CM

## 2017-06-06 DIAGNOSIS — G459 Transient cerebral ischemic attack, unspecified: Secondary | ICD-10-CM

## 2017-06-06 DIAGNOSIS — E785 Hyperlipidemia, unspecified: Secondary | ICD-10-CM
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
## 2017-06-06 NOTE — Patient Instructions (Signed)
Your physician recommends that you continue on your current medications as directed. Please refer to the Current Medication list given to you today. Your physician wants you to follow-up in: 1 year with Dr. Ross.  You will receive a reminder letter in the mail two months in advance. If you don't receive a letter, please call our office to schedule the follow-up appointment.  

## 2017-06-09 ENCOUNTER — Other Ambulatory Visit: Payer: Self-pay

## 2017-06-09 NOTE — Telephone Encounter (Signed)
Received fax from pharmacy requesting pantoprazole 40mg  refill for patient. Refill has been sent to the pharmacy already 06/02/17.

## 2017-06-26 DIAGNOSIS — Q111 Other anophthalmos: Secondary | ICD-10-CM | POA: Diagnosis not present

## 2017-06-26 DIAGNOSIS — H401121 Primary open-angle glaucoma, left eye, mild stage: Secondary | ICD-10-CM | POA: Diagnosis not present

## 2017-06-26 DIAGNOSIS — H40052 Ocular hypertension, left eye: Secondary | ICD-10-CM | POA: Diagnosis not present

## 2017-06-26 DIAGNOSIS — H40039 Anatomical narrow angle, unspecified eye: Secondary | ICD-10-CM | POA: Diagnosis not present

## 2017-06-27 ENCOUNTER — Ambulatory Visit (INDEPENDENT_AMBULATORY_CARE_PROVIDER_SITE_OTHER): Payer: Medicare Other | Admitting: Neurology

## 2017-06-27 ENCOUNTER — Encounter: Payer: Self-pay | Admitting: Neurology

## 2017-06-27 VITALS — BP 140/82 | Ht 74.0 in | Wt 239.0 lb

## 2017-06-27 DIAGNOSIS — R202 Paresthesia of skin: Secondary | ICD-10-CM
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
## 2017-07-05 ENCOUNTER — Ambulatory Visit: Payer: Medicare Other | Admitting: Neurology

## 2017-07-07 ENCOUNTER — Other Ambulatory Visit: Payer: Self-pay | Admitting: Internal Medicine

## 2017-07-10 ENCOUNTER — Telehealth: Payer: Self-pay | Admitting: Family Medicine

## 2017-07-10 ENCOUNTER — Ambulatory Visit: Payer: Medicare Other | Admitting: Neurology

## 2017-07-10 DIAGNOSIS — E785 Hyperlipidemia, unspecified: Secondary | ICD-10-CM

## 2017-07-17 ENCOUNTER — Ambulatory Visit: Payer: Medicare Other | Admitting: Neurology

## 2017-07-17 NOTE — Telephone Encounter (Signed)
atorvastatin (LIPITOR) 10 MG tablet was called into pharmacy but patient says he was taking 20mg . Please call him back to advise if it changed.

## 2017-07-18 NOTE — Telephone Encounter (Signed)
Called pharmacy to clarify, pt is on 20 mg of atorvastatin, DC the 10 mg strength  Called pt and LMOM for him with this info

## 2017-07-18 NOTE — Addendum Note (Signed)
Addended by: Lamar Blinks C on: 07/18/2017 12:46 PM   Modules accepted: Orders

## 2017-07-24 ENCOUNTER — Ambulatory Visit (INDEPENDENT_AMBULATORY_CARE_PROVIDER_SITE_OTHER): Payer: Medicare Other | Admitting: Endocrinology

## 2017-07-24 ENCOUNTER — Encounter: Payer: Self-pay | Admitting: Endocrinology

## 2017-07-24 VITALS — BP 142/86 | HR 65 | Temp 98.2°F | Ht 74.0 in | Wt 242.0 lb

## 2017-07-24 DIAGNOSIS — E1151 Type 2 diabetes mellitus with diabetic peripheral angiopathy without gangrene: Secondary | ICD-10-CM | POA: Diagnosis not present

## 2017-07-24 LAB — POCT GLYCOSYLATED HEMOGLOBIN (HGB A1C)
HEMOGLOBIN A1C: 0 % (ref 4.0–5.6)
HEMOGLOBIN A1C: 6.5 % — AB (ref 4.0–5.6)

## 2017-07-24 MED ORDER — METFORMIN HCL ER 500 MG PO TB24: 1000.0000 mg | ORAL_TABLET | Freq: Every day | ORAL | 3 refills | Status: DC
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
## 2017-07-24 NOTE — Patient Instructions (Addendum)
I have sent a prescription to your pharmacy, to resume the metformin. Please continue the same other diabetes medications.  check your blood sugar once a day.  vary the time of day when you check, between before the 3 meals, and at bedtime.  also check if you have symptoms of your blood sugar being too high or too low.  please keep a record of the readings and bring it to your next appointment here (or you can bring the meter itself).  You can write it on any piece of paper.  please call us sooner if your blood sugar goes below 70, or if you have a lot of readings over 200. Please come back for a follow-up appointment in 6 months.

## 2017-07-25 ENCOUNTER — Telehealth: Payer: Self-pay

## 2017-07-25 NOTE — Telephone Encounter (Signed)
Copied from [place] [id number]. Topic: General - Other >> Jul 25, 2017  9:18 AM Lennox Solders wrote: Reason for CRM: Pt is calling and would like dr Janett Billow copland to return his call. Pt saw dr Loanne Drilling yesterday and he changed some of his medication without talking with dr copland. Pt is requesting dr copland to call him so he can provide more details

## 2017-07-25 NOTE — Telephone Encounter (Signed)
Hi Andrew [name]- please give Andrew [name] a call back.  I looked at Dr. Cordelia Pen note- which is sent to me after every visit- and it looks like he just started [facility] back on metformin.  I think this is a good idea and certainly ok with me.  Let me know if he has any more questions JC

## 2017-07-27 ENCOUNTER — Other Ambulatory Visit: Payer: Self-pay

## 2017-07-27 DIAGNOSIS — L300 Nummular dermatitis: Secondary | ICD-10-CM | POA: Diagnosis not present

## 2017-07-27 DIAGNOSIS — L814 Other melanin hyperpigmentation: Secondary | ICD-10-CM | POA: Diagnosis not present

## 2017-07-27 DIAGNOSIS — L821 Other seborrheic keratosis: Secondary | ICD-10-CM | POA: Diagnosis not present

## 2017-07-27 DIAGNOSIS — D3617 Benign neoplasm of peripheral nerves and autonomic nervous system of trunk, unspecified: Secondary | ICD-10-CM | POA: Diagnosis not present

## 2017-07-27 DIAGNOSIS — D235 Other benign neoplasm of skin of trunk: Secondary | ICD-10-CM | POA: Diagnosis not present

## 2017-07-27 DIAGNOSIS — D225 Melanocytic nevi of trunk: Secondary | ICD-10-CM | POA: Diagnosis not present

## 2017-07-27 DIAGNOSIS — D2371 Other benign neoplasm of skin of right lower limb, including hip: Secondary | ICD-10-CM | POA: Diagnosis not present

## 2017-07-27 DIAGNOSIS — D485 Neoplasm of uncertain behavior of skin: Secondary | ICD-10-CM | POA: Diagnosis not present

## 2017-07-27 NOTE — Telephone Encounter (Signed)
Called patient back and we went over his concerns

## 2017-07-27 NOTE — Telephone Encounter (Signed)
Advised patient and he still would like a call back from J Copland.

## 2017-07-27 NOTE — Telephone Encounter (Signed)
Left message to return call. Ok for pec to discuss.  

## 2017-08-04 ENCOUNTER — Other Ambulatory Visit: Payer: Self-pay | Admitting: Family Medicine

## 2017-08-04 DIAGNOSIS — I10 Essential (primary) hypertension: Secondary | ICD-10-CM

## 2017-08-05 ENCOUNTER — Other Ambulatory Visit: Payer: Self-pay | Admitting: Family Medicine

## 2017-08-08 ENCOUNTER — Other Ambulatory Visit: Payer: Self-pay | Admitting: Endocrinology

## 2017-08-08 DIAGNOSIS — H1032 Unspecified acute conjunctivitis, left eye: Secondary | ICD-10-CM | POA: Diagnosis not present

## 2017-08-14 DIAGNOSIS — H1032 Unspecified acute conjunctivitis, left eye: Secondary | ICD-10-CM | POA: Diagnosis not present

## 2017-08-14 DIAGNOSIS — H40052 Ocular hypertension, left eye: Secondary | ICD-10-CM | POA: Diagnosis not present

## 2017-08-27 ENCOUNTER — Other Ambulatory Visit: Payer: Self-pay | Admitting: Neurology

## 2017-09-19 DIAGNOSIS — H1012 Acute atopic conjunctivitis, left eye: Secondary | ICD-10-CM | POA: Diagnosis not present

## 2017-09-19 DIAGNOSIS — H40052 Ocular hypertension, left eye: Secondary | ICD-10-CM | POA: Diagnosis not present

## 2017-09-29 ENCOUNTER — Other Ambulatory Visit: Payer: Self-pay | Admitting: Endocrinology

## 2017-09-30 ENCOUNTER — Other Ambulatory Visit: Payer: Self-pay | Admitting: Family Medicine

## 2017-09-30 DIAGNOSIS — J309 Allergic rhinitis, unspecified: Secondary | ICD-10-CM
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
## 2017-10-09 ENCOUNTER — Ambulatory Visit (HOSPITAL_BASED_OUTPATIENT_CLINIC_OR_DEPARTMENT_OTHER)
Admission: RE | Admit: 2017-10-09 | Discharge: 2017-10-09 | Disposition: A | Discharge status: Home / Self Care | Payer: Medicare Other | Source: Ambulatory Visit | Attending: Family Medicine | Admitting: Family Medicine

## 2017-10-09 ENCOUNTER — Encounter: Payer: Self-pay | Admitting: Family Medicine

## 2017-10-09 ENCOUNTER — Ambulatory Visit (INDEPENDENT_AMBULATORY_CARE_PROVIDER_SITE_OTHER): Payer: Medicare Other | Admitting: Family Medicine

## 2017-10-09 VITALS — BP 141/70 | HR 65 | Temp 98.1°F | Resp 16 | Ht 74.0 in | Wt 239.0 lb

## 2017-10-09 DIAGNOSIS — M47812 Spondylosis without myelopathy or radiculopathy, cervical region: Secondary | ICD-10-CM | POA: Diagnosis not present

## 2017-10-09 DIAGNOSIS — Z23 Encounter for immunization: Secondary | ICD-10-CM

## 2017-10-09 DIAGNOSIS — R42 Dizziness and giddiness: Secondary | ICD-10-CM | POA: Diagnosis not present

## 2017-10-09 DIAGNOSIS — I10 Essential (primary) hypertension: Secondary | ICD-10-CM

## 2017-10-09 DIAGNOSIS — R972 Elevated prostate specific antigen [PSA]: Secondary | ICD-10-CM | POA: Diagnosis not present

## 2017-10-09 DIAGNOSIS — E785 Hyperlipidemia, unspecified: Secondary | ICD-10-CM | POA: Diagnosis not present

## 2017-10-09 DIAGNOSIS — G459 Transient cerebral ischemic attack, unspecified: Secondary | ICD-10-CM

## 2017-10-09 DIAGNOSIS — G40909 Epilepsy, unspecified, not intractable, without status epilepticus: Secondary | ICD-10-CM

## 2017-10-09 DIAGNOSIS — M542 Cervicalgia: Secondary | ICD-10-CM

## 2017-10-09 LAB — LIPID PANEL
CHOL/HDL RATIO: 4
Cholesterol: 158 mg/dL (ref 0–200)
HDL: 37.1 mg/dL — ABNORMAL LOW (ref >39.00)
LDL Cholesterol: 89 mg/dL (ref 0–99)
NONHDL: 120.64
Triglycerides: 159 mg/dL — ABNORMAL HIGH (ref 0.0–149.0)
VLDL: 31.8 mg/dL (ref 0.0–40.0)

## 2017-10-09 LAB — CBC
HCT: 40.1 % (ref 39.0–52.0)
Hemoglobin: 13.1 g/dL (ref 13.0–17.0)
MCHC: 32.7 g/dL (ref 30.0–36.0)
MCV: 85.6 fl (ref 78.0–100.0)
Platelets: 167 10*3/uL (ref 150.0–400.0)
RBC: 4.68 Mil/uL (ref 4.22–5.81)
RDW: 13.6 % (ref 11.5–15.5)
WBC: 5.1 10*3/uL (ref 4.0–10.5)

## 2017-10-09 LAB — COMPREHENSIVE METABOLIC PANEL
ALT: 14 U/L (ref 0–53)
AST: 14 U/L (ref 0–37)
Albumin: 4.4 g/dL (ref 3.5–5.2)
Alkaline Phosphatase: 88 U/L (ref 39–117)
BILIRUBIN TOTAL: 0.4 mg/dL (ref 0.2–1.2)
BUN: 15 mg/dL (ref 6–23)
CO2: 25 meq/L (ref 19–32)
Calcium: 9 mg/dL (ref 8.4–10.5)
Chloride: 107 mEq/L (ref 96–112)
Creatinine, Ser: 1.06 mg/dL (ref 0.40–1.50)
GFR: 89.76 mL/min (ref >60.00)
GLUCOSE: 179 mg/dL — AB (ref 70–99)
Potassium: 3.7 mEq/L (ref 3.5–5.1)
Sodium: 143 mEq/L (ref 135–145)
Total Protein: 7.5 g/dL (ref 6.0–8.3)

## 2017-10-09 LAB — PSA: PSA: 2.54 ng/mL (ref 0.10–4.00)

## 2017-10-09 NOTE — Patient Instructions (Signed)
Good to see you today!   I will be in touch with your labs and neck film results asap

## 2017-10-27 ENCOUNTER — Other Ambulatory Visit: Payer: Self-pay | Admitting: Endocrinology

## 2017-11-02 DIAGNOSIS — H35032 Hypertensive retinopathy, left eye: Secondary | ICD-10-CM | POA: Diagnosis not present

## 2017-11-02 DIAGNOSIS — H401121 Primary open-angle glaucoma, left eye, mild stage: Secondary | ICD-10-CM | POA: Diagnosis not present

## 2017-11-02 DIAGNOSIS — H35362 Drusen (degenerative) of macula, left eye: Secondary | ICD-10-CM | POA: Diagnosis not present

## 2017-11-02 DIAGNOSIS — E119 Type 2 diabetes mellitus without complications: Secondary | ICD-10-CM | POA: Diagnosis not present

## 2017-11-03 ENCOUNTER — Other Ambulatory Visit: Payer: Self-pay | Admitting: Endocrinology

## 2017-11-06 ENCOUNTER — Telehealth: Payer: Self-pay | Admitting: Family Medicine

## 2017-11-06 DIAGNOSIS — M542 Cervicalgia: Secondary | ICD-10-CM

## 2017-11-06 NOTE — Telephone Encounter (Signed)
Copied from [place] [id number]. Topic: Quick Communication - See Telephone Encounter >> Nov 06, 2017  5:03 PM Blase Mess A wrote: CRM for notification. See Telephone encounter for: 11/06/17. Patient had scan of his neck. The scan determined that he has arthritis in his neck.  His neck is popping like a firecracker Patient would like to ask Dr. Edilia Bo what should he do. Please advise [phone]

## 2017-11-08 NOTE — Telephone Encounter (Signed)
Called him back- he is having neck pain and loud popping No numbness or pain in his arms. However he turned his neck hard once while driving and again while playing golf and got dizzy.  Will refer to ortho  He had carotid dopplers done earlier this year-  Final Interpretation: Right Carotid: Velocities in the right ICA are consistent with a 1-39% stenosis. Left Carotid: Velocities in the left ICA are consistent with a 1-39% stenosis Vertebrals: Bilateral vertebral arteries demonstrate antegrade flow

## 2017-11-21 DIAGNOSIS — M542 Cervicalgia: Secondary | ICD-10-CM | POA: Diagnosis not present

## 2017-11-21 DIAGNOSIS — M5030 Other cervical disc degeneration, unspecified cervical region: Secondary | ICD-10-CM | POA: Diagnosis not present

## 2017-11-24 ENCOUNTER — Other Ambulatory Visit: Payer: Self-pay | Admitting: Endocrinology

## 2017-11-28 ENCOUNTER — Other Ambulatory Visit: Payer: Self-pay | Admitting: Family Medicine

## 2017-12-12 ENCOUNTER — Other Ambulatory Visit: Payer: Self-pay | Admitting: Family Medicine

## 2017-12-14 DIAGNOSIS — M542 Cervicalgia: Secondary | ICD-10-CM | POA: Diagnosis not present

## 2017-12-21 DIAGNOSIS — M542 Cervicalgia: Secondary | ICD-10-CM | POA: Diagnosis not present

## 2017-12-25 DIAGNOSIS — M542 Cervicalgia: Secondary | ICD-10-CM | POA: Diagnosis not present

## 2017-12-28 DIAGNOSIS — M542 Cervicalgia: Secondary | ICD-10-CM | POA: Diagnosis not present

## 2018-01-01 DIAGNOSIS — M542 Cervicalgia: Secondary | ICD-10-CM | POA: Diagnosis not present

## 2018-01-04 DIAGNOSIS — M542 Cervicalgia: Secondary | ICD-10-CM | POA: Diagnosis not present

## 2018-01-08 DIAGNOSIS — M542 Cervicalgia: Secondary | ICD-10-CM | POA: Diagnosis not present

## 2018-01-11 DIAGNOSIS — M542 Cervicalgia: Secondary | ICD-10-CM | POA: Diagnosis not present

## 2018-01-22 ENCOUNTER — Other Ambulatory Visit: Payer: Self-pay | Admitting: Endocrinology

## 2018-01-24 ENCOUNTER — Ambulatory Visit (INDEPENDENT_AMBULATORY_CARE_PROVIDER_SITE_OTHER): Payer: Medicare Other | Admitting: Endocrinology

## 2018-01-24 ENCOUNTER — Encounter: Payer: Self-pay | Admitting: Endocrinology

## 2018-01-24 VITALS — BP 136/78 | HR 64 | Ht 74.0 in | Wt 233.4 lb

## 2018-01-24 DIAGNOSIS — E1151 Type 2 diabetes mellitus with diabetic peripheral angiopathy without gangrene: Secondary | ICD-10-CM

## 2018-01-24 LAB — POCT GLYCOSYLATED HEMOGLOBIN (HGB A1C): Hemoglobin A1C: 6.2 % — AB (ref 4.0–5.6)
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
## 2018-01-24 NOTE — Patient Instructions (Addendum)
Please continue the same diabetes medications.  check your blood sugar once a day.  vary the time of day when you check, between before the 3 meals, and at bedtime.  also check if you have symptoms of your blood sugar being too high or too low.  please keep a record of the readings and bring it to your next appointment here (or you can bring the meter itself).  You can write it on any piece of paper.  please call us sooner if your blood sugar goes below 70, or if you have a lot of readings over 200. Please come back for a follow-up appointment in 6 months.   

## 2018-02-04 ENCOUNTER — Other Ambulatory Visit: Payer: Self-pay | Admitting: Family Medicine

## 2018-02-04 DIAGNOSIS — I10 Essential (primary) hypertension: Secondary | ICD-10-CM

## 2018-02-06 ENCOUNTER — Telehealth: Payer: Self-pay

## 2018-02-06 NOTE — Telephone Encounter (Signed)
PA initiated via Covermymeds; KEY: [id number]. PA approved. Effective 01/07/2018 to 02/06/2019.

## 2018-03-09 ENCOUNTER — Other Ambulatory Visit: Payer: Self-pay | Admitting: Family Medicine

## 2018-03-25 ENCOUNTER — Other Ambulatory Visit: Payer: Self-pay | Admitting: Endocrinology

## 2018-04-08 ENCOUNTER — Other Ambulatory Visit: Payer: Self-pay | Admitting: Endocrinology

## 2018-04-09 ENCOUNTER — Ambulatory Visit: Payer: Medicare Other | Admitting: Family Medicine

## 2018-04-21 ENCOUNTER — Other Ambulatory Visit: Payer: Self-pay | Admitting: Family Medicine

## 2018-04-21 DIAGNOSIS — E785 Hyperlipidemia, unspecified: Secondary | ICD-10-CM

## 2018-04-29 ENCOUNTER — Other Ambulatory Visit: Payer: Self-pay | Admitting: Family Medicine

## 2018-05-07 ENCOUNTER — Other Ambulatory Visit: Payer: Self-pay | Admitting: Endocrinology

## 2018-05-09 DIAGNOSIS — H40052 Ocular hypertension, left eye: Secondary | ICD-10-CM | POA: Diagnosis not present

## 2018-05-09 DIAGNOSIS — H401121 Primary open-angle glaucoma, left eye, mild stage: Secondary | ICD-10-CM | POA: Diagnosis not present

## 2018-05-09 DIAGNOSIS — H1012 Acute atopic conjunctivitis, left eye: Secondary | ICD-10-CM | POA: Diagnosis not present

## 2018-05-09 DIAGNOSIS — H40032 Anatomical narrow angle, left eye: Secondary | ICD-10-CM | POA: Diagnosis not present

## 2018-05-10 ENCOUNTER — Other Ambulatory Visit: Payer: Self-pay | Admitting: Endocrinology

## 2018-05-16 DIAGNOSIS — H401121 Primary open-angle glaucoma, left eye, mild stage: Secondary | ICD-10-CM | POA: Diagnosis not present

## 2018-05-21 ENCOUNTER — Other Ambulatory Visit: Payer: Self-pay | Admitting: Endocrinology

## 2018-06-08 ENCOUNTER — Telehealth: Payer: Self-pay | Admitting: Internal Medicine

## 2018-06-08 NOTE — Telephone Encounter (Signed)
Pt. Does not have a smart phone. Phone Visit    Virtual Visit Pre-Appointment Phone Call  "(Name), I am calling you today to discuss your upcoming appointment. We are currently trying to limit exposure to the virus that causes COVID-19 by seeing patients at home rather than in the office."  1. "What is the BEST phone number to call the day of the visit?" - include this in appointment notes  2. Do you have or have access to (through a family member/friend) a smartphone with video capability that we can use for your visit?" a. If yes - list this number in appt notes as cell (if different from BEST phone #) and list the appointment type as a VIDEO visit in appointment notes b. If no - list the appointment type as a PHONE visit in appointment notes  3. Confirm consent - "In the setting of the current Covid19 crisis, you are scheduled for a (phone or video) visit with your provider on (date) at (time).  Just as we do with many in-office visits, in order for you to participate in this visit, we must obtain consent.  If you'd like, I can send this to your mychart (if signed up) or email for you to review.  Otherwise, I can obtain your verbal consent now.  All virtual visits are billed to your insurance company just like a normal visit would be.  By agreeing to a virtual visit, we'd like you to understand that the technology does not allow for your provider to perform an examination, and thus may limit your provider's ability to fully assess your condition. If your provider identifies any concerns that need to be evaluated in person, we will make arrangements to do so.  Finally, though the technology is pretty good, we cannot assure that it will always work on either your or our end, and in the setting of a video visit, we may have to convert it to a phone-only visit.  In either situation, we cannot ensure that we have a secure connection.  Are you willing to proceed?" YES  4. Advise patient to be  prepared - "Two hours prior to your appointment, go ahead and check your blood pressure, pulse, oxygen saturation, and your weight (if you have the equipment to check those) and write them all down. When your visit starts, your provider will ask you for this information. If you have an Apple Watch or Kardia device, please plan to have heart rate information ready on the day of your appointment. Please have a pen and paper handy nearby the day of the visit as well."  5. Give patient instructions for MyChart download to smartphone OR Doximity/Doxy.me as below if video visit (depending on what platform provider is using)  6. Inform patient they will receive a phone call 15 minutes prior to their appointment time (may be from unknown caller ID) so they should be prepared to answer    TELEPHONE CALL NOTE  Andrew [name] has been deemed a candidate for a follow-up tele-health visit to limit community exposure during the Covid-19 pandemic. I spoke with the patient via phone to ensure availability of phone/video source, confirm preferred email & phone number, and discuss instructions and expectations.  I reminded Andrew [name] to be prepared with any vital sign and/or heart rhythm information that could potentially be obtained via home monitoring, at the time of his visit. I reminded Andrew [name] to expect a phone call prior to his visit.  Minus Liberty 06/08/2018 9:19 AM   INSTRUCTIONS FOR DOWNLOADING THE MYCHART APP TO SMARTPHONE  - The patient must first make sure to have activated MyChart and know their login information - If Apple, go to [employer] and type in MyChart in the search bar and download the app. If Android, ask patient to go to [employer] and type in [place] in the search bar and download the app. The app is free but as with any other app downloads, their phone may require them to verify saved payment information or Apple/Android password.  - The patient will  need to then log into the app with their MyChart username and password, and select [facility] as their healthcare provider to link the account. When it is time for your visit, go to the MyChart app, find appointments, and click Begin Video Visit. Be sure to Select Allow for your device to access the Microphone and Camera for your visit. You will then be connected, and your provider will be with you shortly.  **If they have any issues connecting, or need assistance please contact MyChart service desk [phone])**  **If using a computer, in order to ensure the best quality for their visit they will need to use either of the following Internet Browsers: [employer], or Google Chrome**  IF USING DOXIMITY or DOXY.ME - The patient will receive a link just prior to their visit by text.     FULL LENGTH CONSENT FOR TELE-HEALTH VISIT   I hereby voluntarily request, consent and authorize [facility] and its employed or contracted physicians, physician assistants, nurse practitioners or other licensed health care professionals (the Practitioner), to provide me with telemedicine health care services (the Services") as deemed necessary by the treating Practitioner. I acknowledge and consent to receive the Services by the Practitioner via telemedicine. I understand that the telemedicine visit will involve communicating with the Practitioner through live audiovisual communication technology and the disclosure of certain medical information by electronic transmission. I acknowledge that I have been given the opportunity to request an in-person assessment or other available alternative prior to the telemedicine visit and am voluntarily participating in the telemedicine visit.  I understand that I have the right to withhold or withdraw my consent to the use of telemedicine in the course of my care at any time, without affecting my right to future care or treatment, and that the Practitioner or I  may terminate the telemedicine visit at any time. I understand that I have the right to inspect all information obtained and/or recorded in the course of the telemedicine visit and may receive copies of available information for a reasonable fee.  I understand that some of the potential risks of receiving the Services via telemedicine include:   Delay or interruption in medical evaluation due to technological equipment failure or disruption;  Information transmitted may not be sufficient (e.g. poor resolution of images) to allow for appropriate medical decision making by the Practitioner; and/or   In rare instances, security protocols could fail, causing a breach of personal health information.  Furthermore, I acknowledge that it is my responsibility to provide information about my medical history, conditions and care that is complete and accurate to the best of my ability. I acknowledge that Practitioner's advice, recommendations, and/or decision may be based on factors not within their control, such as incomplete or inaccurate data provided by me or distortions of diagnostic images or specimens that may result from electronic transmissions. I understand that the practice  of medicine is not an Chief Strategy Officer and that Practitioner makes no warranties or guarantees regarding treatment outcomes. I acknowledge that I will receive a copy of this consent concurrently upon execution via email to the email address I last provided but may also request a printed copy by calling the office of [facility].    I understand that my insurance will be billed for this visit.   I have read or had this consent read to me.  I understand the contents of this consent, which adequately explains the benefits and risks of the Services being provided via telemedicine.   I have been provided ample opportunity to ask questions regarding this consent and the Services and have had my questions answered to my satisfaction.  I  give my informed consent for the services to be provided through the use of telemedicine in my medical care  By participating in this telemedicine visit I agree to the above.

## 2018-06-11 ENCOUNTER — Ambulatory Visit: Payer: Medicare Other | Admitting: Internal Medicine

## 2018-06-21 ENCOUNTER — Telehealth (INDEPENDENT_AMBULATORY_CARE_PROVIDER_SITE_OTHER): Payer: Medicare Other | Admitting: Internal Medicine

## 2018-06-21 ENCOUNTER — Other Ambulatory Visit: Payer: Self-pay

## 2018-06-21 ENCOUNTER — Encounter: Payer: Self-pay | Admitting: Internal Medicine

## 2018-06-21 VITALS — BP 128/70 | Ht 74.0 in | Wt 229.0 lb

## 2018-06-21 DIAGNOSIS — I10 Essential (primary) hypertension: Secondary | ICD-10-CM | POA: Diagnosis not present

## 2018-06-21 DIAGNOSIS — G459 Transient cerebral ischemic attack, unspecified: Secondary | ICD-10-CM | POA: Diagnosis not present

## 2018-06-21 DIAGNOSIS — E782 Mixed hyperlipidemia: Secondary | ICD-10-CM

## 2018-06-21 NOTE — Patient Instructions (Signed)
Medication Instructions:  No changes If you need a refill on your cardiac medications before your next appointment, please call your pharmacy.   Lab work: --we will plan to see if lipids can be drawn at Dr. Cordelia Pen office when you go there in July.  We will mail a you a prescription that you can take with you to that appointment.  That way, Dr. Harrington Challenger can get the results.  Follow-Up: At [facility], you and your health needs are our priority.  As part of our continuing mission to provide you with exceptional heart care, we have created designated Provider Care Teams.  These Care Teams include your primary Cardiologist (physician) and Advanced Practice Providers (APPs -  Physician Assistants and Nurse Practitioners) who all work together to provide you with the care you need, when you need it. You will need a follow up appointment in: April, 2021-- 10 months.  Please call our office 2 months in advance to schedule this appointment.  You may see Dorris Carnes, MD or one of the following Advanced Practice Providers on your designated Care Team: Richardson Dopp, PA-C [place], [place] . Daune Perch, NP  Any Other Special Instructions Will Be Listed Below (If Applicable).
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
## 2018-06-25 ENCOUNTER — Telehealth: Payer: Self-pay | Admitting: Neurology

## 2018-06-25 NOTE — Telephone Encounter (Signed)
Due to current COVID 19 pandemic, our office is severely reducing in office visits until further notice, in order to minimize the risk to our patients and healthcare providers.   Called patient to offer virtual visit for 6/23 appt. No answer, LVM with my contact info and requested patient call us.

## 2018-06-27 DIAGNOSIS — H40032 Anatomical narrow angle, left eye: Secondary | ICD-10-CM | POA: Diagnosis not present

## 2018-06-27 DIAGNOSIS — H401121 Primary open-angle glaucoma, left eye, mild stage: Secondary | ICD-10-CM | POA: Diagnosis not present

## 2018-06-27 DIAGNOSIS — H40052 Ocular hypertension, left eye: Secondary | ICD-10-CM | POA: Diagnosis not present

## 2018-07-03 ENCOUNTER — Ambulatory Visit: Payer: Medicare Other | Admitting: Neurology

## 2018-07-04 ENCOUNTER — Other Ambulatory Visit: Payer: Self-pay | Admitting: Internal Medicine

## 2018-07-07 ENCOUNTER — Other Ambulatory Visit: Payer: Self-pay | Admitting: Endocrinology

## 2018-07-16 ENCOUNTER — Other Ambulatory Visit: Payer: Self-pay

## 2018-07-16 ENCOUNTER — Encounter: Payer: Self-pay | Admitting: Neurology

## 2018-07-16 ENCOUNTER — Other Ambulatory Visit: Payer: Self-pay | Admitting: Endocrinology

## 2018-07-16 ENCOUNTER — Ambulatory Visit (INDEPENDENT_AMBULATORY_CARE_PROVIDER_SITE_OTHER): Payer: Medicare Other | Admitting: Neurology

## 2018-07-16 DIAGNOSIS — M4802 Spinal stenosis, cervical region: Secondary | ICD-10-CM | POA: Insufficient documentation

## 2018-07-16 DIAGNOSIS — R4189 Other symptoms and signs involving cognitive functions and awareness: Secondary | ICD-10-CM

## 2018-07-16 DIAGNOSIS — M6258 Muscle wasting and atrophy, not elsewhere classified, other site: Secondary | ICD-10-CM | POA: Diagnosis not present

## 2018-07-16 MED ORDER — CARBAMAZEPINE 200 MG PO TABS: 200.0000 mg | ORAL_TABLET | Freq: Every day | ORAL | 3 refills | Status: DC

## 2018-07-16 NOTE — Patient Instructions (Signed)
I will kindly ask Dr Loanne Drilling, MD  to add CK, CKMB and sodium level and CBC to your lab work. You described RESTLess legs, with rubbing and meshing at night. .    Spinal Stenosis  Spinal stenosis occurs when the open space (spinal canal) between the bones of your spine (vertebrae) narrows, putting pressure on the spinal cord or nerves. What are the causes? This condition is caused by areas of bone pushing into the central canals of your vertebrae. This condition may be present at birth (congenital), or it may be caused by:  Arthritic deterioration of your vertebrae (spinal degeneration). This usually starts around age 86.  Injury or trauma to the spine.  Tumors in the spine.  Calcium deposits in the spine. What are the signs or symptoms? Symptoms of this condition include:  Pain in the neck or back that is generally worse with activities, particularly when standing and walking.  Numbness, tingling, hot or cold sensations, weakness, or weariness in your legs.  Pain going up and down the leg (sciatica).  Frequent episodes of falling.  A foot-slapping gait that leads to muscle weakness. In more serious cases, you may develop:  Problemspassing stool or passing urine.  Difficulty having sex.  Loss of feeling in part or all of your leg. Symptoms may come on slowly and get worse over time. How is this diagnosed? This condition is diagnosed based on your medical history and a physical exam. Tests will also be done, such as:  MRI.  CT scan.  X-ray. How is this treated? Treatment for this condition often focuses on managing your pain and any other symptoms. Treatment may include:  Practicing good posture to lessen pressure on your nerves.  Exercising to strengthen muscles, build endurance, improve balance, and maintain good joint movement (range of motion).  Losing weight, if needed.  Taking medicines to reduce swelling, inflammation, or pain.  Assistive  devices, such as a corset or brace. In some cases, surgery may be needed. The most common procedure is decompression laminectomy. This is done to remove excess bone that puts pressure on your nerve roots. Follow these instructions at home: Managing pain, stiffness, and swelling  Do all exercises and stretches as told by your health care provider.  Practice good posture. If you were given a brace or a corset, wear it as told by your health care provider.  Do not do any activities that cause pain. Ask your health care provider what activities are safe for you.  Do not lift anything that is heavier than 10 lb (4.5 kg) or the limit that your health care provider tells you.  Maintain a healthy weight. Talk with your health care provider if you need help losing weight.  If directed, apply heat to the affected area as often as told by your health care provider. Use the heat source that your health care provider recommends, such as a moist heat pack or a heating pad. ? Place a towel between your skin and the heat source. ? Leave the heat on for 20-30 minutes. ? Remove the heat if your skin turns bright red. This is especially important if you are not able to feel pain, heat, or cold. You may have a greater risk of getting burned. General instructions  Take over-the-counter and prescription medicines only as told by your health care provider.  Do not use any products that contain nicotine or tobacco, such as cigarettes and e-cigarettes. If you need help quitting,  ask your health care provider.  Eat a healthy diet. This includes plenty of fruits and vegetables, whole grains, and low-fat (lean) protein.  Keep all follow-up visits as told by your health care provider. This is important. Contact a health care provider if:  Your symptoms do not get better or they get worse.  You have a fever. Get help right away if:  You have new or worse pain in your neck or upper back.  You have severe pain  that cannot be controlled with medicines.  You are dizzy.  You have vision problems, blurred vision, or double vision.  You have a severe headache that is worse when you stand.  You have nausea or you vomit.  You develop new or worse numbness or tingling in your back or legs.  You have pain, redness, swelling, or warmth in your arm or leg. Summary  Spinal stenosis occurs when the open space (spinal canal) between the bones of your spine (vertebrae) narrows. This narrowing puts pressure on the spinal cord or nerves.  Spinal stenosis can cause numbness, weakness, or pain in the neck, back, and legs.  This condition may be caused by a birth defect, arthritic deterioration of your vertebrae, injury, tumors, or calcium deposits.  This condition is usually diagnosed with MRIs, CT scans, and X-rays. This information is not intended to replace advice given to you by your health care provider. Make sure you discuss any questions you have with your health care provider. Document Released: 03/19/2003 Document Revised: 12/09/2016 Document Reviewed: 12/02/2015 Elsevier Patient Education  2020 [employer].
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
## 2018-07-18 ENCOUNTER — Other Ambulatory Visit: Payer: Self-pay | Admitting: Family Medicine

## 2018-07-18 DIAGNOSIS — J309 Allergic rhinitis, unspecified: Secondary | ICD-10-CM

## 2018-07-20 ENCOUNTER — Other Ambulatory Visit: Payer: Self-pay | Admitting: Endocrinology

## 2018-07-21 ENCOUNTER — Other Ambulatory Visit: Payer: Self-pay | Admitting: Endocrinology

## 2018-07-23 ENCOUNTER — Other Ambulatory Visit: Payer: Self-pay

## 2018-07-25 ENCOUNTER — Other Ambulatory Visit: Payer: Self-pay

## 2018-07-25 ENCOUNTER — Encounter: Payer: Self-pay | Admitting: Endocrinology

## 2018-07-25 ENCOUNTER — Ambulatory Visit (INDEPENDENT_AMBULATORY_CARE_PROVIDER_SITE_OTHER): Payer: Medicare Other | Admitting: Endocrinology

## 2018-07-25 VITALS — BP 152/88 | HR 83 | Ht 74.0 in | Wt 223.2 lb

## 2018-07-25 DIAGNOSIS — E1151 Type 2 diabetes mellitus with diabetic peripheral angiopathy without gangrene: Secondary | ICD-10-CM | POA: Diagnosis not present

## 2018-07-25 LAB — POCT GLYCOSYLATED HEMOGLOBIN (HGB A1C): Hemoglobin A1C: 5.9 % — AB (ref 4.0–5.6)

## 2018-07-25 MED ORDER — SITAGLIPTIN PHOSPHATE 100 MG PO TABS: 50.0000 mg | ORAL_TABLET | Freq: Every day | ORAL | 3 refills | Status: DC
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
## 2018-07-25 NOTE — Patient Instructions (Addendum)
Your blood pressure is high today.  Please see your primary care provider soon, to have it rechecked Please continue the same diabetes medications.  check your blood sugar once a day.  vary the time of day when you check, between before the 3 meals, and at bedtime.  also check if you have symptoms of your blood sugar being too high or too low.  please keep a record of the readings and bring it to your next appointment here (or you can bring the meter itself).  You can write it on any piece of paper.  please call us sooner if your blood sugar goes below 70, or if you have a lot of readings over 200. Please come back for a follow-up appointment in 6 months.

## 2018-07-30 ENCOUNTER — Other Ambulatory Visit: Payer: Self-pay | Admitting: Family Medicine

## 2018-07-30 DIAGNOSIS — I10 Essential (primary) hypertension: Secondary | ICD-10-CM

## 2018-08-01 DIAGNOSIS — H401121 Primary open-angle glaucoma, left eye, mild stage: Secondary | ICD-10-CM | POA: Diagnosis not present

## 2018-08-01 DIAGNOSIS — H40052 Ocular hypertension, left eye: Secondary | ICD-10-CM | POA: Diagnosis not present

## 2018-08-01 DIAGNOSIS — H40032 Anatomical narrow angle, left eye: Secondary | ICD-10-CM | POA: Diagnosis not present
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
## 2018-08-06 ENCOUNTER — Other Ambulatory Visit: Payer: Self-pay

## 2018-08-06 ENCOUNTER — Encounter: Payer: Self-pay | Admitting: Family Medicine

## 2018-08-06 ENCOUNTER — Other Ambulatory Visit: Payer: Self-pay | Admitting: Family Medicine

## 2018-08-06 ENCOUNTER — Ambulatory Visit (INDEPENDENT_AMBULATORY_CARE_PROVIDER_SITE_OTHER): Payer: Medicare Other | Admitting: Family Medicine

## 2018-08-06 VITALS — BP 124/78 | HR 68 | Temp 98.2°F | Resp 16 | Ht 74.0 in | Wt 222.0 lb

## 2018-08-06 DIAGNOSIS — Z125 Encounter for screening for malignant neoplasm of prostate: Secondary | ICD-10-CM

## 2018-08-06 DIAGNOSIS — E119 Type 2 diabetes mellitus without complications: Secondary | ICD-10-CM | POA: Diagnosis not present

## 2018-08-06 DIAGNOSIS — R209 Unspecified disturbances of skin sensation: Secondary | ICD-10-CM | POA: Diagnosis not present

## 2018-08-06 DIAGNOSIS — I10 Essential (primary) hypertension: Secondary | ICD-10-CM

## 2018-08-06 LAB — BASIC METABOLIC PANEL
BUN: 17 mg/dL (ref 6–23)
CO2: 26 mEq/L (ref 19–32)
Calcium: 9 mg/dL (ref 8.4–10.5)
Chloride: 108 mEq/L (ref 96–112)
Creatinine, Ser: 1.05 mg/dL (ref 0.40–1.50)
GFR: 85.17 mL/min (ref >60.00)
Glucose, Bld: 124 mg/dL — ABNORMAL HIGH (ref 70–99)
Potassium: 3.6 mEq/L (ref 3.5–5.1)
Sodium: 144 mEq/L (ref 135–145)

## 2018-08-06 LAB — CBC
HCT: 40.3 % (ref 39.0–52.0)
Hemoglobin: 13 g/dL (ref 13.0–17.0)
MCHC: 32.2 g/dL (ref 30.0–36.0)
MCV: 87.3 fl (ref 78.0–100.0)
Platelets: 161 10*3/uL (ref 150.0–400.0)
RBC: 4.61 Mil/uL (ref 4.22–5.81)
RDW: 13.4 % (ref 11.5–15.5)
WBC: 4.8 10*3/uL (ref 4.0–10.5)

## 2018-08-06 LAB — PSA, MEDICARE: PSA: 3.85 ng/ml (ref 0.10–4.00)

## 2018-08-06 NOTE — Patient Instructions (Signed)
Great to see you today- you BP looks fine Continue to monitor at home and let me know if higher than about 135/85 For the time being continue same meds  I will be in touch with your labs and will send Dr. D the labs she requested  Take care! Let's plan to visit in about 6 months assuming all is well

## 2018-08-07 LAB — CK TOTAL AND CKMB (NOT AT ARMC)
CK, MB: 1 ng/mL (ref 0–5.0)
Relative Index: 1.6 (ref 0–4.0)
Total CK: 61 U/L (ref 44–196)

## 2018-08-21 ENCOUNTER — Other Ambulatory Visit: Payer: Self-pay | Admitting: Neurology

## 2018-09-04 ENCOUNTER — Other Ambulatory Visit: Payer: Self-pay | Admitting: Family Medicine

## 2018-09-10 ENCOUNTER — Other Ambulatory Visit: Payer: Self-pay | Admitting: Endocrinology
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
## 2018-09-24 ENCOUNTER — Ambulatory Visit (INDEPENDENT_AMBULATORY_CARE_PROVIDER_SITE_OTHER): Payer: Medicare Other | Admitting: Family Medicine

## 2018-09-24 ENCOUNTER — Other Ambulatory Visit: Payer: Self-pay

## 2018-09-24 ENCOUNTER — Encounter: Payer: Self-pay | Admitting: Family Medicine

## 2018-09-24 VITALS — BP 122/68 | HR 64 | Temp 97.2°F | Resp 16 | Ht 74.0 in | Wt 223.0 lb

## 2018-09-24 DIAGNOSIS — L918 Other hypertrophic disorders of the skin: Secondary | ICD-10-CM

## 2018-09-24 DIAGNOSIS — Z23 Encounter for immunization: Secondary | ICD-10-CM | POA: Diagnosis not present

## 2018-09-24 NOTE — Patient Instructions (Signed)
Great to see you today- let me know if any concerns after procedure today Leave clean and dry until tomorrow, then shower as usual.  Use a band- aid if needed for any oozing Any significant bleeding or sign of infection please alert me!    Flu shot given today

## 2018-09-25 ENCOUNTER — Ambulatory Visit: Payer: Medicare Other

## 2018-09-27 DIAGNOSIS — Q111 Other anophthalmos: Secondary | ICD-10-CM | POA: Diagnosis not present

## 2018-09-27 DIAGNOSIS — H401121 Primary open-angle glaucoma, left eye, mild stage: Secondary | ICD-10-CM | POA: Diagnosis not present

## 2018-09-27 DIAGNOSIS — H35032 Hypertensive retinopathy, left eye: Secondary | ICD-10-CM | POA: Diagnosis not present

## 2018-09-27 DIAGNOSIS — H35362 Drusen (degenerative) of macula, left eye: Secondary | ICD-10-CM | POA: Diagnosis not present

## 2018-10-03 ENCOUNTER — Other Ambulatory Visit: Payer: Self-pay | Admitting: Endocrinology

## 2018-10-12 ENCOUNTER — Other Ambulatory Visit: Payer: Self-pay | Admitting: Endocrinology

## 2018-10-16 ENCOUNTER — Other Ambulatory Visit: Payer: Self-pay | Admitting: *Deleted

## 2018-10-16 DIAGNOSIS — E785 Hyperlipidemia, unspecified: Secondary | ICD-10-CM

## 2018-10-16 DIAGNOSIS — J309 Allergic rhinitis, unspecified: Secondary | ICD-10-CM

## 2018-10-16 MED ORDER — ATORVASTATIN CALCIUM 20 MG PO TABS: 20.0000 mg | ORAL_TABLET | Freq: Every day | ORAL | 1 refills | Status: DC

## 2018-10-16 MED ORDER — FLUTICASONE PROPIONATE 50 MCG/ACT NA SUSP: 2.0000 | Freq: Every day | NASAL | 1 refills | Status: DC

## 2018-10-16 NOTE — Addendum Note (Signed)
Addended by: Kem Boroughs D on: 10/16/2018 02:35 PM   Modules accepted: Orders

## 2018-10-23 ENCOUNTER — Telehealth: Payer: Self-pay

## 2018-10-23 NOTE — Telephone Encounter (Signed)
I would be happy to assist you--I just need to know more specifics

## 2018-10-23 NOTE — Telephone Encounter (Signed)
Patient called in about the concerns of metFORMIN (GLUCOPHAGE-XR) 500 MG 24 hr tablet.  Wanting to take soemthing else please call and advise

## 2018-10-23 NOTE — Telephone Encounter (Signed)
There is nothing wrong with metformin itself.  Some lots have an impurity.  Please call your pharmacy, to ask about yours.

## 2018-10-23 NOTE — Telephone Encounter (Signed)
Please advise 

## 2018-10-23 NOTE — Telephone Encounter (Signed)
Pt saw a news report that Metformin causes cancer

## 2018-10-23 NOTE — Telephone Encounter (Signed)
LVM requesting returned call 

## 2018-10-23 NOTE — Telephone Encounter (Signed)
SECOND ATTEMPT: ° °LVM requesting returned call. °

## 2018-10-24 NOTE — Telephone Encounter (Signed)
Patient returned Andrew [name] call. Patient states that he will speak with Pharmacy re: Metformin.

## 2018-10-24 NOTE — Telephone Encounter (Signed)
FINAL ATTEMPT:  Called pt again to inform of Dr. Cordelia Pen advice. LVM requesting returned call

## 2018-10-26 ENCOUNTER — Other Ambulatory Visit: Payer: Self-pay | Admitting: *Deleted

## 2018-10-26 MED ORDER — PANTOPRAZOLE SODIUM 40 MG PO TBEC: 40.0000 mg | DELAYED_RELEASE_TABLET | Freq: Every day | ORAL | 1 refills | Status: DC

## 2018-11-05 ENCOUNTER — Other Ambulatory Visit: Payer: Self-pay

## 2018-11-05 MED ORDER — FARXIGA 10 MG PO TABS: 5.0000 mg | ORAL_TABLET | Freq: Every day | ORAL | 1 refills | Status: DC

## 2018-11-06 ENCOUNTER — Other Ambulatory Visit: Payer: Self-pay

## 2018-11-06 ENCOUNTER — Ambulatory Visit (INDEPENDENT_AMBULATORY_CARE_PROVIDER_SITE_OTHER): Payer: Medicare Other | Admitting: Medical

## 2018-11-06 ENCOUNTER — Encounter: Payer: Self-pay | Admitting: Medical

## 2018-11-06 VITALS — BP 150/85 | HR 62 | Temp 96.8°F | Resp 16 | Ht 74.0 in | Wt 220.8 lb

## 2018-11-06 DIAGNOSIS — I10 Essential (primary) hypertension: Secondary | ICD-10-CM

## 2018-11-06 DIAGNOSIS — T148XXA Other injury of unspecified body region, initial encounter: Secondary | ICD-10-CM

## 2018-11-06 LAB — COMPREHENSIVE METABOLIC PANEL
ALT: 9 U/L (ref 0–53)
AST: 11 U/L (ref 0–37)
Albumin: 4.5 g/dL (ref 3.5–5.2)
Alkaline Phosphatase: 86 U/L (ref 39–117)
BUN: 13 mg/dL (ref 6–23)
CO2: 27 mEq/L (ref 19–32)
Calcium: 9 mg/dL (ref 8.4–10.5)
Chloride: 108 mEq/L (ref 96–112)
Creatinine, Ser: 0.87 mg/dL (ref 0.40–1.50)
GFR: 105.73 mL/min (ref >60.00)
Glucose, Bld: 103 mg/dL — ABNORMAL HIGH (ref 70–99)
Potassium: 3.5 mEq/L (ref 3.5–5.1)
Sodium: 143 mEq/L (ref 135–145)
Total Bilirubin: 0.4 mg/dL (ref 0.2–1.2)
Total Protein: 7.3 g/dL (ref 6.0–8.3)

## 2018-11-06 LAB — CBC WITH DIFFERENTIAL/PLATELET
Basophils Absolute: 0 10*3/uL (ref 0.0–0.1)
Basophils Relative: 0.8 % (ref 0.0–3.0)
Eosinophils Absolute: 0.1 10*3/uL (ref 0.0–0.7)
Eosinophils Relative: 2.8 % (ref 0.0–5.0)
HCT: 40 % (ref 39.0–52.0)
Hemoglobin: 12.9 g/dL — ABNORMAL LOW (ref 13.0–17.0)
Lymphocytes Relative: 33.4 % (ref 12.0–46.0)
Lymphs Abs: 1.5 10*3/uL (ref 0.7–4.0)
MCHC: 32.2 g/dL (ref 30.0–36.0)
MCV: 87.2 fl (ref 78.0–100.0)
Monocytes Absolute: 0.4 10*3/uL (ref 0.1–1.0)
Monocytes Relative: 8 % (ref 3.0–12.0)
Neutro Abs: 2.5 10*3/uL (ref 1.4–7.7)
Neutrophils Relative %: 55 % (ref 43.0–77.0)
Platelets: 167 10*3/uL (ref 150.0–400.0)
RBC: 4.59 Mil/uL (ref 4.22–5.81)
RDW: 13.5 % (ref 11.5–15.5)
WBC: 4.5 10*3/uL (ref 4.0–10.5)

## 2018-11-06 MED ORDER — CARVEDILOL 25 MG PO TABS: ORAL_TABLET | ORAL | 0 refills | Status: DC
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
## 2018-11-06 NOTE — Patient Instructions (Signed)
For recent bruising will get cbc/check platelets. Since only one area I think you bumped area  by accident but will evaluate lab to investigate. If more recurrent bruising no reason left [place] know.  Will get cmp today to check kidney function. Bp elevated today. Off coreg presently and in office. Restart coreg and keep checking bp. Would like bp to be 130/80 approximate.  Follow up date to be determined after lab review.

## 2018-11-30 IMAGING — MR MR HEAD W/O CM
10 series · 48 of 48 positions shown · non-contrast
Comparison: MRI of the brain August 24, 2010

CLINICAL DATA: Dizziness, gait imbalance for 1 week. History of
stroke, hypertension, hyperlipidemia, diabetes.

EXAM:
MRI HEAD WITHOUT CONTRAST
TECHNIQUE: Multiplanar, multiecho pulse sequences of the brain and surrounding
structures were obtained without intravenous contrast.

[Series 5: T1 · sagittal · 4.0mm · 0.75mm/px · 2 of 31 slices shown (1 of 2)]
[im 1/31]
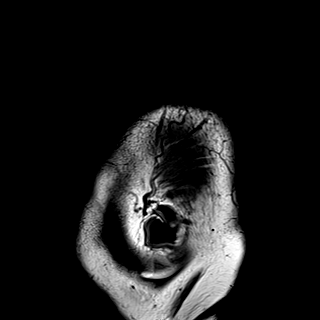
[im 31/31]
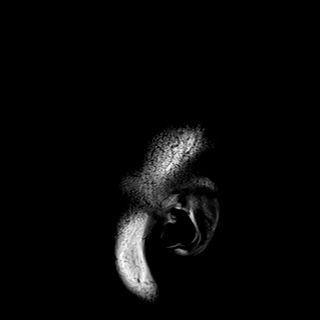

[Series 6: DWI · axial · 3.0mm · 1.44mm/px · z∈[-33,+110]mm · 8 of 92 slices shown (1 of 4)]
[im 1/92]
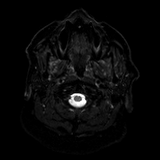
[im 14/92]
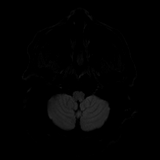
[im 27/92]
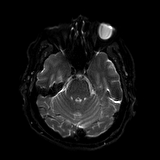
[im 40/92]
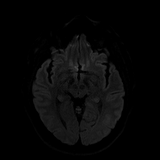
[im 53/92]
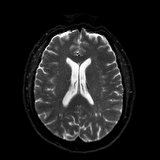
[im 66/92]
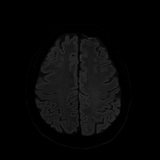
[im 79/92]
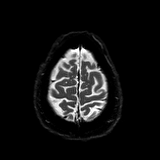
[im 92/92]
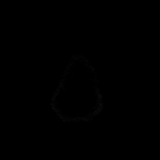

[Series 7: DWI · axial · 3.0mm · 1.44mm/px · z∈[-33,+110]mm · 3 of 41 slices shown (2 of 4)]
[im 1/41]
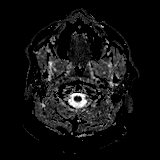
[im 21/41]
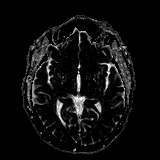
[im 41/41]
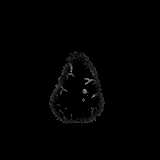

[Series 8: DWI · coronal · 5.0mm · 1.44mm/px · 5 of 64 slices shown (3 of 4)]
[im 1/64]
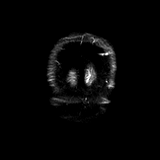
[im 16/64]
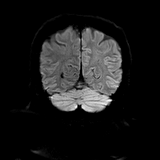
[im 32/64]
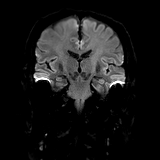
[im 48/64]
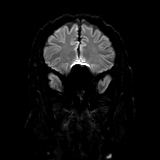
[im 64/64]
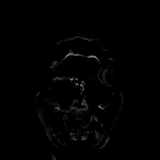

[Series 9: DWI · coronal · 5.0mm · 1.44mm/px · 3 of 32 slices shown (4 of 4)]
[im 1/32]
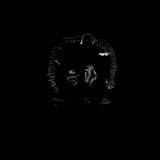
[im 16/32]
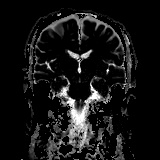
[im 32/32]
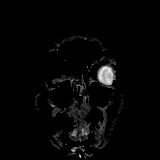

[Series 10: T2 · axial · 4.0mm · 0.36mm/px · z∈[-38,+107]mm · 2 of 30 slices shown (1 of 2)]
[im 1/30]
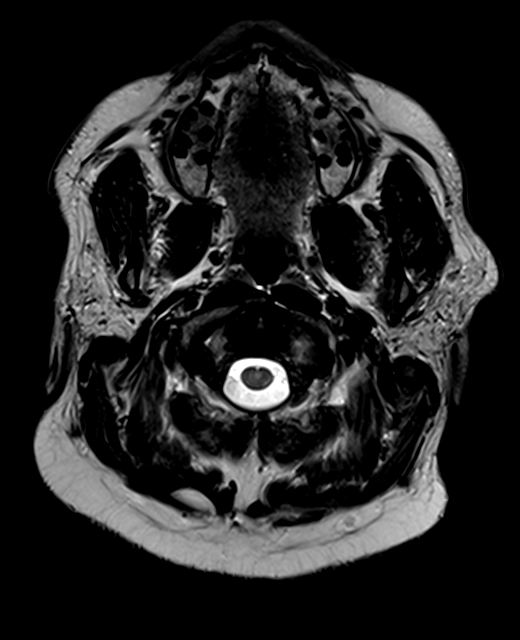
[im 30/30]
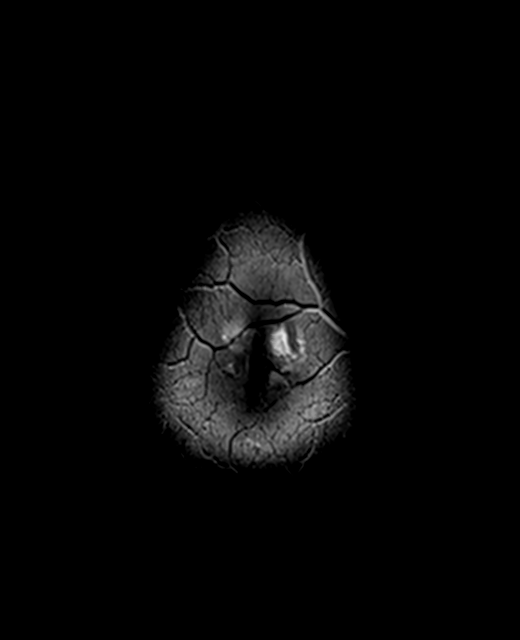

[Series 11: FLAIR · axial · 3.0mm · 0.72mm/px · z∈[-38,+107]mm · 2 of 26 slices shown]
[im 1/26]
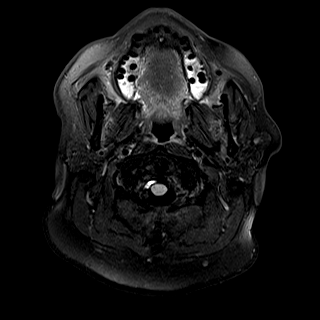
[im 26/26]
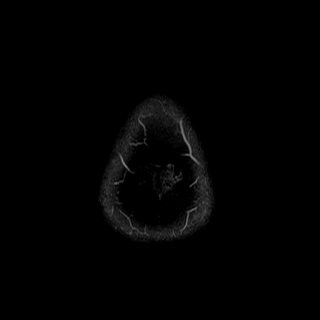

[Series 13: swi_images · axial · 1.5mm · 0.90mm/px · z∈[-34,+103]mm · 8 of 96 slices shown]
[im 1/96]
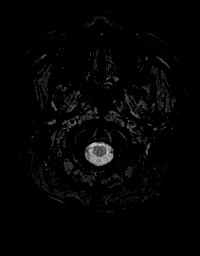
[im 14/96]
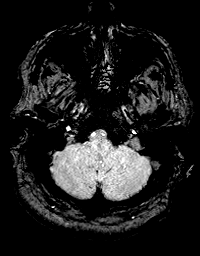
[im 28/96]
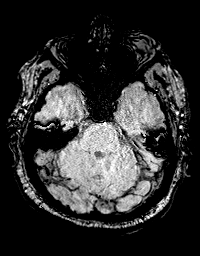
[im 41/96]
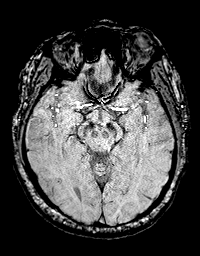
[im 55/96]
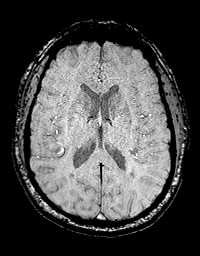
[im 68/96]
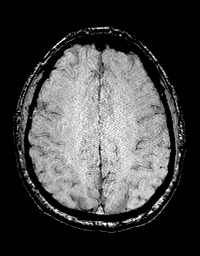
[im 82/96]
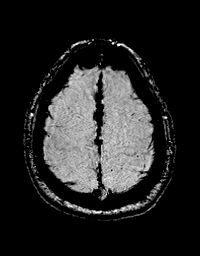
[im 96/96]
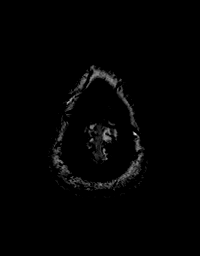

[Series 14: T1 · axial · 1.0mm · 0.90mm/px · z∈[-34,+103]mm · 12 of 144 slices shown (2 of 2)]
[im 1/144]
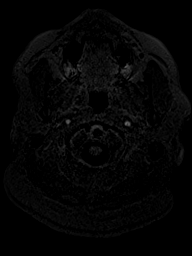
[im 14/144]
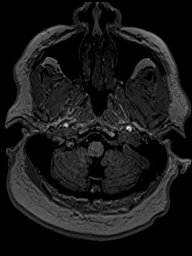
[im 27/144]
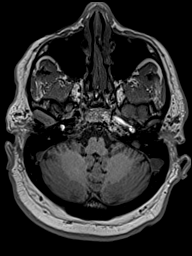
[im 40/144]
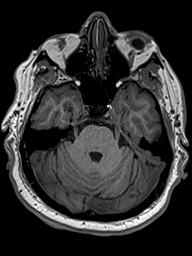
[im 53/144]
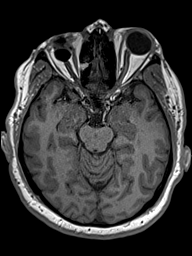
[im 66/144]
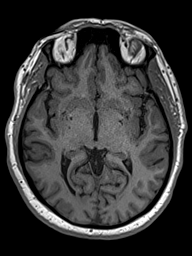
[im 79/144]
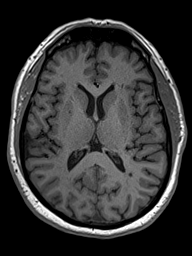
[im 92/144]
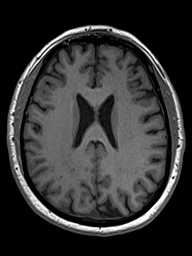
[im 105/144]
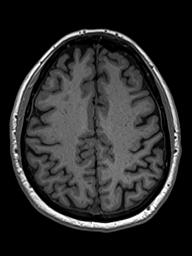
[im 118/144]
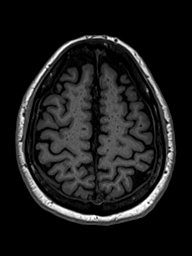
[im 131/144]
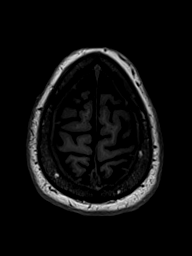
[im 144/144]
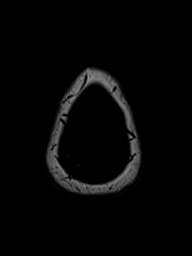

[Series 15: T2 · coronal · 4.5mm · 0.36mm/px · 3 of 32 slices shown (2 of 2)]
[im 1/32]
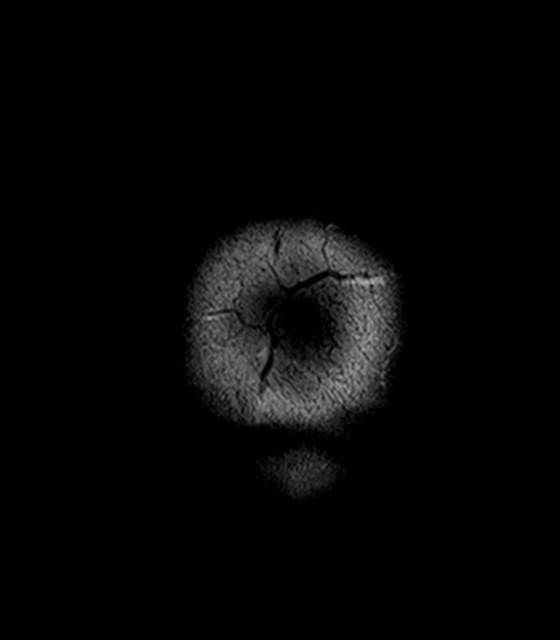
[im 16/32]
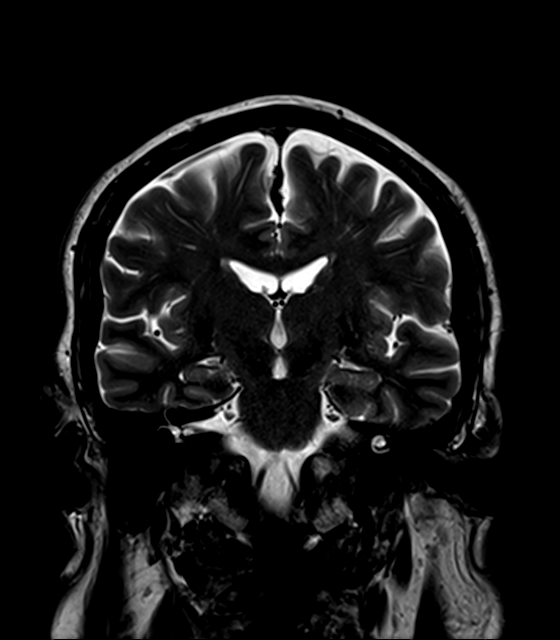
[im 32/32]
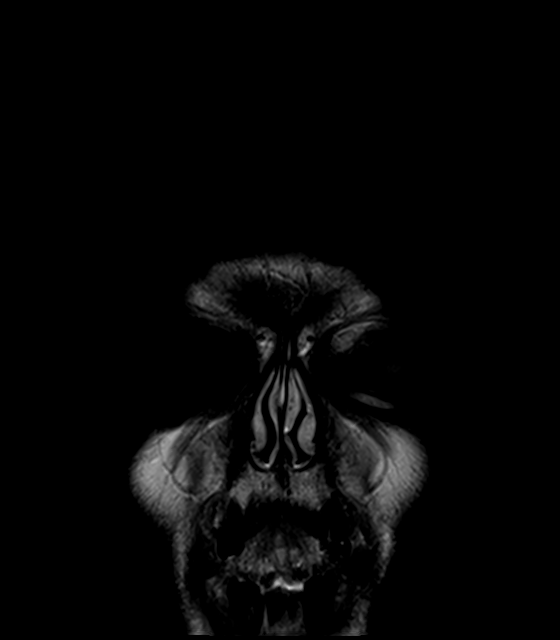

[48 of 48 positions shown; findings below may reference images not displayed]

FINDINGS: INTRACRANIAL CONTENTS: No reduced diffusion to suggest acute
ischemia. No susceptibility artifact to suggest hemorrhage. Bulky
unchanged falcine calcifications. The ventricles and sulci are
normal for patient's age. A few scattered subcentimeter
supratentorial white matter FLAIR T2 hyperintensities most
compatible chronic small vessel ischemic disease, less than expected
for age. Stable prominent parietal perivascular spaces. No
suspicious parenchymal signal, masses, mass effect. No abnormal
extra-axial fluid collections. No extra-axial masses.

VASCULAR: Normal major intracranial vascular flow voids present at
skull base.

SKULL AND UPPER CERVICAL SPINE: No abnormal sellar expansion. No
suspicious calvarial bone marrow signal particular attention to the
clivus similar pannus about the odontoid process most compatible
with CPPD.. Craniocervical junction maintained.

SINUSES/ORBITS: The mastoid air-cells and included paranasal sinuses
are well-aerated.RIGHT ocular globe prosthesis.

OTHER: None.
IMPRESSION: Negative noncontrast MRI of the head for age.

## 2018-12-25 ENCOUNTER — Other Ambulatory Visit: Payer: Self-pay | Admitting: Endocrinology

## 2019-01-01 ENCOUNTER — Ambulatory Visit: Payer: Medicare Other

## 2019-01-01 ENCOUNTER — Other Ambulatory Visit: Payer: Self-pay

## 2019-01-03 ENCOUNTER — Encounter: Payer: Self-pay | Admitting: Family Medicine

## 2019-01-07 ENCOUNTER — Other Ambulatory Visit: Payer: Self-pay

## 2019-01-07 MED ORDER — PIOGLITAZONE HCL 15 MG PO TABS: ORAL_TABLET | ORAL | 0 refills | Status: DC

## 2019-01-13 NOTE — Patient Instructions (Addendum)
It was great to see you again today, I will be in touch with your results ASAP Please consider getting the Shingrix shingles vaccine at your drugstore at your convenience, if not done already  We will try and see what the issue is with your pantoprazole   Please weight yourself once a week- if you continue to lose please alert me
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
## 2019-01-14 ENCOUNTER — Other Ambulatory Visit: Payer: Self-pay

## 2019-01-14 ENCOUNTER — Ambulatory Visit (INDEPENDENT_AMBULATORY_CARE_PROVIDER_SITE_OTHER): Payer: Medicare Other | Admitting: Family Medicine

## 2019-01-14 ENCOUNTER — Encounter: Payer: Self-pay | Admitting: Family Medicine

## 2019-01-14 VITALS — BP 160/80 | HR 68 | Temp 97.1°F | Resp 16 | Ht 74.0 in | Wt 212.0 lb

## 2019-01-14 DIAGNOSIS — I10 Essential (primary) hypertension: Secondary | ICD-10-CM

## 2019-01-14 DIAGNOSIS — E119 Type 2 diabetes mellitus without complications: Secondary | ICD-10-CM

## 2019-01-14 DIAGNOSIS — R634 Abnormal weight loss: Secondary | ICD-10-CM

## 2019-01-14 DIAGNOSIS — G459 Transient cerebral ischemic attack, unspecified: Secondary | ICD-10-CM

## 2019-01-14 DIAGNOSIS — E785 Hyperlipidemia, unspecified: Secondary | ICD-10-CM

## 2019-01-14 DIAGNOSIS — Z125 Encounter for screening for malignant neoplasm of prostate: Secondary | ICD-10-CM

## 2019-01-14 DIAGNOSIS — R972 Elevated prostate specific antigen [PSA]: Secondary | ICD-10-CM

## 2019-01-14 MED ORDER — ATORVASTATIN CALCIUM 20 MG PO TABS: 20.0000 mg | ORAL_TABLET | Freq: Every day | ORAL | 3 refills | Status: DC

## 2019-01-14 MED ORDER — PIOGLITAZONE HCL 15 MG PO TABS: ORAL_TABLET | ORAL | 0 refills | Status: DC

## 2019-01-14 MED ORDER — CLONIDINE 0.3 MG/24HR TD PTWK: MEDICATED_PATCH | TRANSDERMAL | 3 refills | Status: DC

## 2019-01-14 MED ORDER — OLMESARTAN MEDOXOMIL 40 MG PO TABS: ORAL_TABLET | ORAL | 3 refills | Status: DC

## 2019-01-14 MED ORDER — FARXIGA 5 MG PO TABS: 5.0000 mg | ORAL_TABLET | Freq: Every day | ORAL | 3 refills | Status: DC

## 2019-01-14 MED ORDER — CLOPIDOGREL BISULFATE 75 MG PO TABS: 75.0000 mg | ORAL_TABLET | Freq: Every day | ORAL | 3 refills | Status: DC

## 2019-01-14 MED ORDER — CARVEDILOL 25 MG PO TABS: ORAL_TABLET | ORAL | 3 refills | Status: DC

## 2019-01-15 ENCOUNTER — Telehealth: Payer: Self-pay

## 2019-01-15 ENCOUNTER — Other Ambulatory Visit (INDEPENDENT_AMBULATORY_CARE_PROVIDER_SITE_OTHER): Payer: Medicare Other

## 2019-01-15 DIAGNOSIS — R634 Abnormal weight loss: Secondary | ICD-10-CM

## 2019-01-15 LAB — LIPID PANEL
Cholesterol: 171 mg/dL (ref 0–200)
HDL: 47.1 mg/dL (ref >39.00)
LDL Cholesterol: 98 mg/dL (ref 0–99)
NonHDL: 124.17
Total CHOL/HDL Ratio: 4
Triglycerides: 131 mg/dL (ref 0.0–149.0)
VLDL: 26.2 mg/dL (ref 0.0–40.0)

## 2019-01-15 LAB — HEMOGLOBIN A1C: Hgb A1c MFr Bld: 5.9 % (ref 4.6–6.5)

## 2019-01-15 LAB — PSA: PSA: 3.49 ng/mL (ref 0.10–4.00)

## 2019-01-15 LAB — TSH: TSH: 1.04 u[IU]/mL (ref 0.35–4.50)

## 2019-01-15 NOTE — Telephone Encounter (Signed)
I know there was nothing on the form for you to sign-I feel like I may have mailed it back to him im almost positive.

## 2019-01-15 NOTE — Telephone Encounter (Signed)
-----   Message from Darreld Mclean, MD sent at 01/14/2019  8:01 PM EST ----- Hi- he got a letter about his protonix which he apparently brought to the office last week.  He was supposed to have a pneumonia shot as a nurse visit, but it turned out he did not need the shot.  He says the person he spoke to took his protonix letter but I don't have it.  Can you see who did his recnet nurse visit?  Thank you!  [place]

## 2019-01-15 NOTE — Telephone Encounter (Signed)
I took the paper from him that day and gave to you in your folder while you were out. I know you answered me-the paper basically just was asking if the patient is to continue on medication and if so a new rx needs to be sent. I believe you put on the paper ok if patient would like to continue.

## 2019-01-16 MED ORDER — PANTOPRAZOLE SODIUM 40 MG PO TBEC: 40.0000 mg | DELAYED_RELEASE_TABLET | Freq: Every day | ORAL | 3 refills | Status: DC

## 2019-01-16 NOTE — Telephone Encounter (Signed)
Okay, will just refill Protonix.  Hopefully that is all that is needed

## 2019-01-16 NOTE — Addendum Note (Signed)
Addended by: Lamar Blinks C on: 01/16/2019 12:31 PM   Modules accepted: Orders

## 2019-01-23 ENCOUNTER — Encounter: Payer: Self-pay | Admitting: Adult Health

## 2019-01-23 ENCOUNTER — Other Ambulatory Visit: Payer: Self-pay

## 2019-01-23 ENCOUNTER — Telehealth: Payer: Self-pay | Admitting: Family Medicine

## 2019-01-23 ENCOUNTER — Ambulatory Visit (INDEPENDENT_AMBULATORY_CARE_PROVIDER_SITE_OTHER): Payer: Medicare Other | Admitting: Adult Health

## 2019-01-23 VITALS — BP 140/88 | HR 57 | Temp 97.8°F | Ht 74.0 in | Wt 222.2 lb

## 2019-01-23 DIAGNOSIS — R202 Paresthesia of skin: Secondary | ICD-10-CM | POA: Diagnosis not present

## 2019-01-23 DIAGNOSIS — Z125 Encounter for screening for malignant neoplasm of prostate: Secondary | ICD-10-CM

## 2019-01-23 NOTE — Addendum Note (Signed)
Addended by: Lamar Blinks C on: 01/23/2019 04:40 PM   Modules accepted: Orders
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
## 2019-01-23 NOTE — Telephone Encounter (Signed)
Copied from [place] [id number]. Topic: General - Other >> Jan 23, 2019  2:29 PM Antonieta Iba C wrote: Reason for CRM: pt called in to request a referral to a urologist, per his lab results from PCP.   CB: [phone]

## 2019-01-23 NOTE — Telephone Encounter (Signed)
Referral placed.

## 2019-01-23 NOTE — Patient Instructions (Signed)
Your Plan:  Continue carbamazepine If your symptoms worsen or you develop new symptoms please let us know.   Thank you for coming to see us at [facility]. I hope we have been able to provide you high quality care today.  You may receive a patient satisfaction survey over the next few weeks. We would appreciate your feedback and comments so that we may continue to improve ourselves and the health of our patients.  

## 2019-01-25 ENCOUNTER — Other Ambulatory Visit: Payer: Self-pay

## 2019-01-29 ENCOUNTER — Ambulatory Visit (INDEPENDENT_AMBULATORY_CARE_PROVIDER_SITE_OTHER): Payer: Medicare Other | Admitting: Endocrinology

## 2019-01-29 ENCOUNTER — Other Ambulatory Visit: Payer: Self-pay

## 2019-01-29 ENCOUNTER — Encounter: Payer: Self-pay | Admitting: Endocrinology

## 2019-01-29 VITALS — BP 140/80 | HR 88 | Ht 74.0 in | Wt 220.6 lb

## 2019-01-29 DIAGNOSIS — E119 Type 2 diabetes mellitus without complications: Secondary | ICD-10-CM

## 2019-01-29 DIAGNOSIS — E1151 Type 2 diabetes mellitus with diabetic peripheral angiopathy without gangrene: Secondary | ICD-10-CM

## 2019-01-29 MED ORDER — FARXIGA 5 MG PO TABS: 5.0000 mg | ORAL_TABLET | Freq: Every day | ORAL | 3 refills | Status: DC

## 2019-01-29 MED ORDER — METFORMIN HCL ER 500 MG PO TB24: 500.0000 mg | ORAL_TABLET | Freq: Every day | ORAL | 3 refills | Status: DC
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
## 2019-01-29 NOTE — Patient Instructions (Addendum)
Your blood pressure is high today.  Please see your primary care provider soon, to have it rechecked Please reduce the metformin to 1 pill pill day, and: Please continue the same other diabetes medications.  check your blood sugar once a day.  vary the time of day when you check, between before the 3 meals, and at bedtime.  also check if you have symptoms of your blood sugar being too high or too low.  please keep a record of the readings and bring it to your next appointment here (or you can bring the meter itself).  You can write it on any piece of paper.  please call us sooner if your blood sugar goes below 70, or if you have a lot of readings over 200. Please come back for a follow-up appointment in 6 months.    

## 2019-02-03 ENCOUNTER — Ambulatory Visit: Payer: Medicare Other

## 2019-02-04 ENCOUNTER — Telehealth: Payer: Self-pay

## 2019-02-04 NOTE — Telephone Encounter (Signed)
PA initiated via Covermymeds; KEY: [id number]. Awaiting determination.

## 2019-02-04 NOTE — Telephone Encounter (Signed)
PA approved. Effective 01/05/2019 to 02/04/2020.

## 2019-02-06 ENCOUNTER — Ambulatory Visit: Payer: Medicare Other | Admitting: Family Medicine

## 2019-02-19 DIAGNOSIS — N5201 Erectile dysfunction due to arterial insufficiency: Secondary | ICD-10-CM | POA: Diagnosis not present

## 2019-02-19 DIAGNOSIS — R972 Elevated prostate specific antigen [PSA]: Secondary | ICD-10-CM | POA: Diagnosis not present

## 2019-03-07 DIAGNOSIS — H40032 Anatomical narrow angle, left eye: Secondary | ICD-10-CM | POA: Diagnosis not present

## 2019-03-07 DIAGNOSIS — H401121 Primary open-angle glaucoma, left eye, mild stage: Secondary | ICD-10-CM | POA: Diagnosis not present

## 2019-04-09 ENCOUNTER — Other Ambulatory Visit: Payer: Self-pay | Admitting: Endocrinology

## 2019-04-09 DIAGNOSIS — E119 Type 2 diabetes mellitus without complications: Secondary | ICD-10-CM
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
## 2019-04-15 ENCOUNTER — Ambulatory Visit (INDEPENDENT_AMBULATORY_CARE_PROVIDER_SITE_OTHER): Payer: Medicare Other | Admitting: Internal Medicine

## 2019-04-15 ENCOUNTER — Encounter: Payer: Self-pay | Admitting: Internal Medicine

## 2019-04-15 ENCOUNTER — Other Ambulatory Visit: Payer: Self-pay

## 2019-04-15 VITALS — BP 128/72 | HR 73 | Ht 74.0 in | Wt 220.0 lb

## 2019-04-15 DIAGNOSIS — I10 Essential (primary) hypertension: Secondary | ICD-10-CM

## 2019-04-15 NOTE — Patient Instructions (Signed)
Medication Instructions:  No changes *If you need a refill on your cardiac medications before your next appointment, please call your pharmacy*   Lab Work: none If you have labs (blood work) drawn today and your tests are completely normal, you will receive your results only by: . MyChart Message (if you have MyChart) OR . A paper copy in the mail If you have any lab test that is abnormal or we need to change your treatment, we will call you to review the results.   Testing/Procedures: none   Follow-Up: At [facility], you and your health needs are our priority.  As part of our continuing mission to provide you with exceptional heart care, we have created designated Provider Care Teams.  These Care Teams include your primary Cardiologist (physician) and Advanced Practice Providers (APPs -  Physician Assistants and Nurse Practitioners) who all work together to provide you with the care you need, when you need it.  We recommend signing up for the patient portal called "MyChart".  Sign up information is provided on this After Visit Summary.  MyChart is used to connect with patients for Virtual Visits (Telemedicine).  Patients are able to view lab/test results, encounter notes, upcoming appointments, etc.  Non-urgent messages can be sent to your provider as well.   To learn more about what you can do with MyChart, go to [email or web address].    Your next appointment:   7 month(s)  The format for your next appointment:   In Person  Provider:   You may see Paula Ross, MD or one of the following Advanced Practice Providers on your designated Care Team:    Scott Weaver, PA-C  Vin Bhagat, PA-C   Other Instructions   

## 2019-04-19 ENCOUNTER — Other Ambulatory Visit: Payer: Self-pay

## 2019-04-19 DIAGNOSIS — E785 Hyperlipidemia, unspecified: Secondary | ICD-10-CM

## 2019-04-19 DIAGNOSIS — J309 Allergic rhinitis, unspecified: Secondary | ICD-10-CM

## 2019-04-19 MED ORDER — ATORVASTATIN CALCIUM 20 MG PO TABS: 20.0000 mg | ORAL_TABLET | Freq: Every day | ORAL | 3 refills | Status: DC

## 2019-04-19 MED ORDER — FLUTICASONE PROPIONATE 50 MCG/ACT NA SUSP: 2.0000 | Freq: Every day | NASAL | 1 refills | Status: DC

## 2019-06-11 ENCOUNTER — Encounter: Payer: Self-pay | Admitting: Gastroenterology

## 2019-06-24 DIAGNOSIS — H40032 Anatomical narrow angle, left eye: Secondary | ICD-10-CM | POA: Diagnosis not present

## 2019-06-24 DIAGNOSIS — H401121 Primary open-angle glaucoma, left eye, mild stage: Secondary | ICD-10-CM | POA: Diagnosis not present

## 2019-06-24 LAB — HM DIABETES EYE EXAM

## 2019-06-28 ENCOUNTER — Other Ambulatory Visit: Payer: Self-pay | Admitting: Internal Medicine

## 2019-07-07 ENCOUNTER — Other Ambulatory Visit: Payer: Self-pay | Admitting: Endocrinology

## 2019-07-07 DIAGNOSIS — E119 Type 2 diabetes mellitus without complications: Secondary | ICD-10-CM

## 2019-07-11 ENCOUNTER — Other Ambulatory Visit: Payer: Self-pay | Admitting: Endocrinology

## 2019-07-29 ENCOUNTER — Other Ambulatory Visit: Payer: Self-pay

## 2019-07-29 ENCOUNTER — Ambulatory Visit (INDEPENDENT_AMBULATORY_CARE_PROVIDER_SITE_OTHER): Payer: Medicare Other | Admitting: Endocrinology

## 2019-07-29 VITALS — BP 150/90 | HR 67 | Ht 74.02 in | Wt 216.0 lb

## 2019-07-29 DIAGNOSIS — E1151 Type 2 diabetes mellitus with diabetic peripheral angiopathy without gangrene: Secondary | ICD-10-CM | POA: Diagnosis not present

## 2019-07-29 LAB — POCT GLYCOSYLATED HEMOGLOBIN (HGB A1C): Hemoglobin A1C: 5.9 % — AB (ref 4.0–5.6)

## 2019-07-29 MED ORDER — METFORMIN HCL ER 500 MG PO TB24: 500.0000 mg | ORAL_TABLET | Freq: Every day | ORAL | 3 refills | Status: DC
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
## 2019-07-29 NOTE — Patient Instructions (Addendum)
Your blood pressure is high today.  Please see your primary care provider soon, to have it rechecked Please reduce the metformin to 1 pill pill day, and: Please continue the same other diabetes medications.  check your blood sugar once a day.  vary the time of day when you check, between before the 3 meals, and at bedtime.  also check if you have symptoms of your blood sugar being too high or too low.  please keep a record of the readings and bring it to your next appointment here (or you can bring the meter itself).  You can write it on any piece of paper.  please call us sooner if your blood sugar goes below 70, or if you have a lot of readings over 200. Please come back for a follow-up appointment in 6 months.

## 2019-07-30 ENCOUNTER — Other Ambulatory Visit: Payer: Self-pay | Admitting: Neurology

## 2019-08-14 ENCOUNTER — Other Ambulatory Visit: Payer: Self-pay | Admitting: Endocrinology

## 2019-08-20 DIAGNOSIS — R972 Elevated prostate specific antigen [PSA]: Secondary | ICD-10-CM | POA: Diagnosis not present

## 2019-09-12 ENCOUNTER — Encounter: Payer: Self-pay | Admitting: Gastroenterology
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
## 2019-10-02 ENCOUNTER — Ambulatory Visit (INDEPENDENT_AMBULATORY_CARE_PROVIDER_SITE_OTHER): Payer: Medicare Other | Admitting: Family Medicine

## 2019-10-02 ENCOUNTER — Encounter: Payer: Self-pay | Admitting: Family Medicine

## 2019-10-02 ENCOUNTER — Other Ambulatory Visit: Payer: Self-pay

## 2019-10-02 VITALS — BP 118/72 | HR 72 | Ht 74.0 in | Wt 223.0 lb

## 2019-10-02 DIAGNOSIS — Z125 Encounter for screening for malignant neoplasm of prostate: Secondary | ICD-10-CM

## 2019-10-02 DIAGNOSIS — E785 Hyperlipidemia, unspecified: Secondary | ICD-10-CM | POA: Diagnosis not present

## 2019-10-02 DIAGNOSIS — R972 Elevated prostate specific antigen [PSA]: Secondary | ICD-10-CM | POA: Diagnosis not present

## 2019-10-02 DIAGNOSIS — Z23 Encounter for immunization: Secondary | ICD-10-CM

## 2019-10-02 DIAGNOSIS — I10 Essential (primary) hypertension: Secondary | ICD-10-CM | POA: Diagnosis not present

## 2019-10-02 NOTE — Patient Instructions (Signed)
Great to see you again today- please see me in about 6 months Consider shingrix vaccine and covid booster as we discussed I will be in touch with your labs

## 2019-10-02 NOTE — Addendum Note (Signed)
Addended by: Wynonia Musty A on: 10/02/2019 02:52 PM   Modules accepted: Orders

## 2019-10-03 LAB — LIPID PANEL
Cholesterol: 143 mg/dL (ref <200)
HDL: 46 mg/dL (ref >=40)
LDL Cholesterol (Calc): 79 mg/dL (calc)
Non-HDL Cholesterol (Calc): 97 mg/dL (calc) (ref <130)
Total CHOL/HDL Ratio: 3.1 (calc) (ref <5.0)
Triglycerides: 98 mg/dL (ref <150)

## 2019-10-03 LAB — COMPREHENSIVE METABOLIC PANEL
AG Ratio: 1.3 (calc) (ref 1.0–2.5)
ALT: 12 U/L (ref 9–46)
AST: 13 U/L (ref 10–35)
Albumin: 4.3 g/dL (ref 3.6–5.1)
Alkaline phosphatase (APISO): 76 U/L (ref 35–144)
BUN: 15 mg/dL (ref 7–25)
CO2: 24 mmol/L (ref 20–32)
Calcium: 9 mg/dL (ref 8.6–10.3)
Chloride: 109 mmol/L (ref 98–110)
Creat: 1.14 mg/dL (ref 0.70–1.25)
Globulin: 3.2 g/dL (calc) (ref 1.9–3.7)
Glucose, Bld: 123 mg/dL — ABNORMAL HIGH (ref 65–99)
Potassium: 3.8 mmol/L (ref 3.5–5.3)
Sodium: 144 mmol/L (ref 135–146)
Total Bilirubin: 0.5 mg/dL (ref 0.2–1.2)
Total Protein: 7.5 g/dL (ref 6.1–8.1)

## 2019-10-03 LAB — CBC
HCT: 41.7 % (ref 38.5–50.0)
Hemoglobin: 13.2 g/dL (ref 13.2–17.1)
MCH: 27.7 pg (ref 27.0–33.0)
MCHC: 31.7 g/dL — ABNORMAL LOW (ref 32.0–36.0)
MCV: 87.4 fL (ref 80.0–100.0)
MPV: 10.4 fL (ref 7.5–12.5)
Platelets: 154 10*3/uL (ref 140–400)
RBC: 4.77 10*6/uL (ref 4.20–5.80)
RDW: 12.6 % (ref 11.0–15.0)
WBC: 4.8 10*3/uL (ref 3.8–10.8)

## 2019-10-05 ENCOUNTER — Other Ambulatory Visit: Payer: Self-pay | Admitting: Endocrinology

## 2019-10-05 DIAGNOSIS — E119 Type 2 diabetes mellitus without complications: Secondary | ICD-10-CM

## 2019-10-10 DIAGNOSIS — Z23 Encounter for immunization: Secondary | ICD-10-CM | POA: Diagnosis not present

## 2019-10-15 ENCOUNTER — Other Ambulatory Visit: Payer: Self-pay | Admitting: Family Medicine

## 2019-10-15 DIAGNOSIS — J309 Allergic rhinitis, unspecified: Secondary | ICD-10-CM

## 2019-10-28 DIAGNOSIS — H401121 Primary open-angle glaucoma, left eye, mild stage: Secondary | ICD-10-CM | POA: Diagnosis not present

## 2019-10-28 DIAGNOSIS — H40032 Anatomical narrow angle, left eye: Secondary | ICD-10-CM | POA: Diagnosis not present

## 2019-10-28 DIAGNOSIS — H2512 Age-related nuclear cataract, left eye: Secondary | ICD-10-CM | POA: Diagnosis not present

## 2019-10-28 DIAGNOSIS — E119 Type 2 diabetes mellitus without complications: Secondary | ICD-10-CM | POA: Diagnosis not present

## 2019-10-28 LAB — HM DIABETES EYE EXAM

## 2019-10-29 ENCOUNTER — Encounter: Payer: Self-pay | Admitting: Family Medicine

## 2019-11-12 ENCOUNTER — Ambulatory Visit (INDEPENDENT_AMBULATORY_CARE_PROVIDER_SITE_OTHER): Payer: Medicare Other | Admitting: Gastroenterology

## 2019-11-12 ENCOUNTER — Encounter: Payer: Self-pay | Admitting: Gastroenterology

## 2019-11-12 ENCOUNTER — Telehealth: Payer: Self-pay

## 2019-11-12 VITALS — BP 124/70 | HR 88 | Ht 74.0 in | Wt 225.0 lb

## 2019-11-12 DIAGNOSIS — Z7902 Long term (current) use of antithrombotics/antiplatelets: Secondary | ICD-10-CM

## 2019-11-12 DIAGNOSIS — Z86010 Personal history of colonic polyps: Secondary | ICD-10-CM

## 2019-11-12 MED ORDER — NA SULFATE-K SULFATE-MG SULF 17.5-3.13-1.6 GM/177ML PO SOLN: 1.0000 | Freq: Once | ORAL | 0 refills | Status: AC

## 2019-11-12 NOTE — Telephone Encounter (Signed)
Called pt- advised that stopping plavix may increase risk of stroke, but using plavix during colonoscopy increases bleeding risk.  He feel comfortable stopping plavix for the 5 days.  As such, I approve plan to hold plavix for 5 days prior to upcoming colonoscopy J Shenita Trego MD

## 2019-11-12 NOTE — Telephone Encounter (Signed)
Patient understands per Dr. Lorelei Pont to hold plavix x 5 days before procedure.

## 2019-11-12 NOTE — Telephone Encounter (Signed)
° °  Andrew [name] 07-08-1950 [id number]  Dear Dr. Lorelei Pont:  We have scheduled the above named patient for a(n) Colonoscopy procedure. Our records show that (s)he is on anticoagulation therapy.  Please advise as to whether the patient may come off their therapy of Plavix 5 days prior to their procedure which is scheduled for 11/21/19.  Please route your response to Dorisann Frames, CMA or fax response to [phone].  Sincerely,    [facility]

## 2019-11-12 NOTE — Patient Instructions (Signed)
You have been scheduled for a colonoscopy. Please follow written instructions given to you at your visit today.  Please pick up your prep supplies at the pharmacy within the next 1-3 days. If you use inhalers (even only as needed), please bring them with you on the day of your procedure.   Normal BMI (Body Mass Index- based on height and weight) is between 23 and 30. Your BMI today is Body mass index is 28.89 kg/m. Marland Kitchen Please consider follow up  regarding your BMI with your Primary Care Provider.  Thank you for choosing me and [facility].  Pricilla Riffle. Dagoberto Ligas., MD., Marval Regal
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
## 2019-11-15 ENCOUNTER — Other Ambulatory Visit: Payer: Self-pay

## 2019-11-15 ENCOUNTER — Encounter: Payer: Self-pay | Admitting: Internal Medicine

## 2019-11-15 ENCOUNTER — Ambulatory Visit (INDEPENDENT_AMBULATORY_CARE_PROVIDER_SITE_OTHER): Payer: Medicare Other | Admitting: Internal Medicine

## 2019-11-15 VITALS — BP 126/60 | HR 68 | Ht 74.0 in | Wt 228.8 lb

## 2019-11-15 DIAGNOSIS — G459 Transient cerebral ischemic attack, unspecified: Secondary | ICD-10-CM

## 2019-11-15 DIAGNOSIS — I10 Essential (primary) hypertension: Secondary | ICD-10-CM

## 2019-11-15 DIAGNOSIS — E782 Mixed hyperlipidemia: Secondary | ICD-10-CM
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
## 2019-11-15 NOTE — Patient Instructions (Signed)
Medication Instructions:  NO CHANGES *If you need a refill on your cardiac medications before your next appointment, please call your pharmacy*   Lab Work: NONE If you have labs (blood work) drawn today and your tests are completely normal, you will receive your results only by: Marland Kitchen MyChart Message (if you have MyChart) OR . A paper copy in the mail If you have any lab test that is abnormal or we need to change your treatment, we will call you to review the results.   Testing/Procedures: NONE   Follow-Up: At [facility], you and your health needs are our priority.  As part of our continuing mission to provide you with exceptional heart care, we have created designated Provider Care Teams.  These Care Teams include your primary Cardiologist (physician) and Advanced Practice Providers (APPs -  Physician Assistants and Nurse Practitioners) who all work together to provide you with the care you need, when you need it.  We recommend signing up for the patient portal called "MyChart".  Sign up information is provided on this After Visit Summary.  MyChart is used to connect with patients for Virtual Visits (Telemedicine).  Patients are able to view lab/test results, encounter notes, upcoming appointments, etc.  Non-urgent messages can be sent to your provider as well.   To learn more about what you can do with MyChart, go to [email or web address].    Your next appointment:   8 month(s)  The format for your next appointment:   In Person  Provider:   You may see Dorris Carnes, MD or one of the following Advanced Practice Providers on your designated Care Team:    Richardson Dopp, PA-C  Robbie Lis, [place]    Other Instructions

## 2019-11-21 ENCOUNTER — Ambulatory Visit (AMBULATORY_SURGERY_CENTER): Payer: Medicare Other | Admitting: Gastroenterology

## 2019-11-21 ENCOUNTER — Encounter: Payer: Self-pay | Admitting: Gastroenterology

## 2019-11-21 ENCOUNTER — Other Ambulatory Visit: Payer: Self-pay

## 2019-11-21 VITALS — BP 138/73 | HR 54 | Temp 98.9°F | Resp 14 | Ht 74.0 in | Wt 228.0 lb

## 2019-11-21 DIAGNOSIS — D124 Benign neoplasm of descending colon: Secondary | ICD-10-CM

## 2019-11-21 DIAGNOSIS — Z86010 Personal history of colonic polyps: Secondary | ICD-10-CM

## 2019-11-21 DIAGNOSIS — Z1211 Encounter for screening for malignant neoplasm of colon: Secondary | ICD-10-CM | POA: Diagnosis not present

## 2019-11-21 MED ORDER — SODIUM CHLORIDE 0.9 % IV SOLN: 500.0000 mL | Freq: Once | INTRAVENOUS | Status: DC

## 2019-11-21 NOTE — Patient Instructions (Signed)
Discharge instructions given. Handouts on polyps and diverticulosis. Resume Plavix tomorrow at prior dose. Resume previous medications. YOU HAD AN ENDOSCOPIC PROCEDURE TODAY AT [facility]:   Refer to the procedure report that was given to you for any specific questions about what was found during the examination.  If the procedure report does not answer your questions, please call your gastroenterologist to clarify.  If you requested that your care partner not be given the details of your procedure findings, then the procedure report has been included in a sealed envelope for you to review at your convenience later.  YOU SHOULD EXPECT: Some feelings of bloating in the abdomen. Passage of more gas than usual.  Walking can help get rid of the air that was put into your GI tract during the procedure and reduce the bloating. If you had a lower endoscopy (such as a colonoscopy or flexible sigmoidoscopy) you may notice spotting of blood in your stool or on the toilet paper. If you underwent a bowel prep for your procedure, you may not have a normal bowel movement for a few days.  Please Note:  You might notice some irritation and congestion in your nose or some drainage.  This is from the oxygen used during your procedure.  There is no need for concern and it should clear up in a day or so.  SYMPTOMS TO REPORT IMMEDIATELY:   Following lower endoscopy (colonoscopy or flexible sigmoidoscopy):  Excessive amounts of blood in the stool  Significant tenderness or worsening of abdominal pains  Swelling of the abdomen that is new, acute  Fever of 100F or higher   For urgent or emergent issues, a gastroenterologist can be reached at any hour by calling [phone]. Do not use MyChart messaging for urgent concerns.    DIET:  We do recommend a small meal at first, but then you may proceed to your regular diet.  Drink plenty of fluids but you should avoid alcoholic beverages for 24  hours.  ACTIVITY:  You should plan to take it easy for the rest of today and you should NOT DRIVE or use heavy machinery until tomorrow (because of the sedation medicines used during the test).    FOLLOW UP: Our staff will call the number listed on your records 48-72 hours following your procedure to check on you and address any questions or concerns that you may have regarding the information given to you following your procedure. If we do not reach you, we will leave a message.  We will attempt to reach you two times.  During this call, we will ask if you have developed any symptoms of COVID 19. If you develop any symptoms (ie: fever, flu-like symptoms, shortness of breath, cough etc.) before then, please call [phone].  If you test positive for Covid 19 in the 2 weeks post procedure, please call and report this information to [place].    If any biopsies were taken you will be contacted by phone or by letter within the next 1-3 weeks.  Please call us at [phone] if you have not heard about the biopsies in 3 weeks.    SIGNATURES/CONFIDENTIALITY: You and/or your care partner have signed paperwork which will be entered into your electronic medical record.  These signatures attest to the fact that that the information above on your After Visit Summary has been reviewed and is understood.  Full responsibility of the confidentiality of this discharge information lies with you and/or your care-partner.

## 2019-11-21 NOTE — Progress Notes (Signed)
Called to room to assist during endoscopic procedure.  Patient ID and intended procedure confirmed with present staff. Received instructions for my participation in the procedure from the performing physician.  

## 2019-11-21 NOTE — Progress Notes (Signed)
History reviewed today  VS WR

## 2019-11-21 NOTE — Progress Notes (Signed)
A and O x3. Report to RN. Tolerated MAC anesthesia well.

## 2019-11-21 NOTE — Op Note (Signed)
[facility] Patient Name: Andrew [name] Procedure Date: 11/21/2019 9:21 AM MRN: [id number] Endoscopist: Ladene Artist , MD Age: 69 Referring MD:  Date of Birth: [birth year] Gender: Male Account #: [id number] Procedure:                Colonoscopy Indications:              Surveillance: Personal history of adenomatous                            polyps on last colonoscopy 5 years ago Medicines:                Monitored Anesthesia Care Procedure:                Pre-Anesthesia Assessment:                           - Prior to the procedure, a History and Physical                            was performed, and patient medications and                            allergies were reviewed. The patient's tolerance of                            previous anesthesia was also reviewed. The risks                            and benefits of the procedure and the sedation                            options and risks were discussed with the patient.                            All questions were answered, and informed consent                            was obtained. Prior Anticoagulants: The patient has                            taken Plavix (clopidogrel), last dose was 5 days                            prior to procedure. ASA Grade Assessment: III - A                            patient with severe systemic disease. After                            reviewing the risks and benefits, the patient was                            deemed in satisfactory condition to undergo the  procedure.                           After obtaining informed consent, the colonoscope                            was passed under direct vision. Throughout the                            procedure, the patient's blood pressure, pulse, and                            oxygen saturations were monitored continuously. The                            Colonoscope was introduced through the anus and                             advanced to the the cecum, identified by                            appendiceal orifice and ileocecal valve. The                            ileocecal valve, appendiceal orifice, and rectum                            were photographed. The quality of the bowel                            preparation was good. The colonoscopy was performed                            without difficulty. The patient tolerated the                            procedure well. Scope In: 9:27:17 AM Scope Out: 9:41:14 AM Scope Withdrawal Time: 0 hours 10 minutes 47 seconds  Total Procedure Duration: 0 hours 13 minutes 57 seconds  Findings:                 The perianal and digital rectal examinations were                            normal.                           A 8 mm polyp was found in the descending colon. The                            polyp was sessile. The polyp was removed with a                            cold snare. Resection and retrieval were complete.  Multiple small-mouthed diverticula were found in                            the left colon.                           The exam was otherwise without abnormality on                            direct and retroflexion views. Complications:            No immediate complications. Estimated blood loss:                            None. Estimated Blood Loss:     Estimated blood loss: none. Impression:               - One 8 mm polyp in the descending colon, removed                            with a cold snare. Resected and retrieved.                           - Mild diverticulosis in the left colon.                           - The examination was otherwise normal on direct                            and retroflexion views. Recommendation:           - Repeat colonoscopy after studies are complete for                            surveillance based on pathology results.                           - Resume Plavix (clopidogrel)  tomorrow at prior                            dose. Refer to managing physician for further                            adjustment of therapy.                           - Patient has a contact number available for                            emergencies. The signs and symptoms of potential                            delayed complications were discussed with the                            patient. Return to normal activities tomorrow.  Written discharge instructions were provided to the                            patient.                           - Resume previous diet.                           - Continue present medications.                           - Await pathology results. Ladene Artist, MD 11/21/2019 9:46:54 AM This report has been signed electronically.

## 2019-11-25 ENCOUNTER — Telehealth: Payer: Self-pay

## 2019-11-25 NOTE — Telephone Encounter (Signed)
Pt is wanting to inform he is doing well.

## 2019-11-25 NOTE — Telephone Encounter (Signed)
  Follow up Call-  Call back number 11/21/2019  Post procedure Call Back phone  # [phone]  Permission to leave phone message Yes  Some recent data might be hidden    1st follow call made.  NALM

## 2019-11-25 NOTE — Telephone Encounter (Signed)
  Follow up Call-  Call back number 11/21/2019  Post procedure Call Back phone  # [phone]  Permission to leave phone message Yes  Some recent data might be hidden    2nd follow up call made.  NALM

## 2019-12-03 ENCOUNTER — Encounter: Payer: Self-pay | Admitting: Gastroenterology

## 2019-12-11 ENCOUNTER — Other Ambulatory Visit: Payer: Self-pay | Admitting: Family Medicine

## 2019-12-11 DIAGNOSIS — I10 Essential (primary) hypertension: Secondary | ICD-10-CM

## 2019-12-18 ENCOUNTER — Telehealth: Payer: Self-pay

## 2019-12-18 NOTE — Telephone Encounter (Signed)
PA initiated via Covermymeds; KEY: [id number]. Awaiting determination.

## 2019-12-18 NOTE — Telephone Encounter (Signed)
PA approved. Effective 11/18/2019 to 12/17/2020.

## 2020-01-01 ENCOUNTER — Telehealth: Payer: Self-pay | Admitting: Family Medicine

## 2020-01-01 ENCOUNTER — Other Ambulatory Visit: Payer: Self-pay | Admitting: Family Medicine

## 2020-01-01 DIAGNOSIS — I10 Essential (primary) hypertension: Secondary | ICD-10-CM

## 2020-01-01 NOTE — Telephone Encounter (Signed)
Patient states insurance denied Carvedilol 25 mg

## 2020-01-02 ENCOUNTER — Other Ambulatory Visit: Payer: Self-pay | Admitting: *Deleted

## 2020-01-02 DIAGNOSIS — G459 Transient cerebral ischemic attack, unspecified: Secondary | ICD-10-CM

## 2020-01-02 MED ORDER — CLOPIDOGREL BISULFATE 75 MG PO TABS: 75.0000 mg | ORAL_TABLET | Freq: Every day | ORAL | 3 refills | Status: DC

## 2020-01-02 NOTE — Telephone Encounter (Signed)
PA initiated. Key: [id number] Sent to plan waiting on determination.

## 2020-01-02 NOTE — Telephone Encounter (Signed)
PA archived. Spoke with patient-states there was a misunderstanding. The prescription at the pharmacy was expired and needed new script. New script yesterday for new year. Patient was advised at pharmacy he can pick up later today.

## 2020-01-08 ENCOUNTER — Other Ambulatory Visit: Payer: Self-pay | Admitting: Endocrinology

## 2020-01-08 DIAGNOSIS — E119 Type 2 diabetes mellitus without complications: Secondary | ICD-10-CM

## 2020-01-23 ENCOUNTER — Encounter: Payer: Self-pay | Admitting: Adult Health

## 2020-01-23 ENCOUNTER — Ambulatory Visit (INDEPENDENT_AMBULATORY_CARE_PROVIDER_SITE_OTHER): Payer: Medicare Other | Admitting: Adult Health

## 2020-01-23 VITALS — BP 150/88 | HR 70 | Ht 74.0 in | Wt 222.0 lb

## 2020-01-23 DIAGNOSIS — R202 Paresthesia of skin: Secondary | ICD-10-CM

## 2020-01-23 DIAGNOSIS — Z5181 Encounter for therapeutic drug level monitoring: Secondary | ICD-10-CM | POA: Diagnosis not present

## 2020-01-23 MED ORDER — CARBAMAZEPINE 200 MG PO TABS: ORAL_TABLET | ORAL | 3 refills | Status: DC

## 2020-01-23 NOTE — Patient Instructions (Signed)
Your Plan:  Continue carbamazepine Blood work today   Thank you for coming to see us at [facility]. I hope we have been able to provide you high quality care today.  You may receive a patient satisfaction survey over the next few weeks. We would appreciate your feedback and comments so that we may continue to improve ourselves and the health of our patients.  
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
## 2020-01-24 LAB — CBC WITH DIFFERENTIAL/PLATELET
Basophils Absolute: 0 10*3/uL (ref 0.0–0.2)
Basos: 1 %
EOS (ABSOLUTE): 0.2 10*3/uL (ref 0.0–0.4)
Eos: 3 %
Hematocrit: 41.9 % (ref 37.5–51.0)
Hemoglobin: 13.4 g/dL (ref 13.0–17.7)
Immature Grans (Abs): 0 10*3/uL (ref 0.0–0.1)
Immature Granulocytes: 0 %
Lymphocytes Absolute: 1.5 10*3/uL (ref 0.7–3.1)
Lymphs: 32 %
MCH: 27.7 pg (ref 26.6–33.0)
MCHC: 32 g/dL (ref 31.5–35.7)
MCV: 87 fL (ref 79–97)
Monocytes Absolute: 0.4 10*3/uL (ref 0.1–0.9)
Monocytes: 9 %
Neutrophils Absolute: 2.6 10*3/uL (ref 1.4–7.0)
Neutrophils: 55 %
Platelets: 164 10*3/uL (ref 150–450)
RBC: 4.84 x10E6/uL (ref 4.14–5.80)
RDW: 12.8 % (ref 11.6–15.4)
WBC: 4.7 10*3/uL (ref 3.4–10.8)

## 2020-01-24 LAB — COMPREHENSIVE METABOLIC PANEL
ALT: 10 IU/L (ref 0–44)
AST: 17 IU/L (ref 0–40)
Albumin/Globulin Ratio: 1.7 (ref 1.2–2.2)
Albumin: 4.7 g/dL (ref 3.8–4.8)
Alkaline Phosphatase: 104 IU/L (ref 44–121)
BUN/Creatinine Ratio: 12 (ref 10–24)
BUN: 14 mg/dL (ref 8–27)
Bilirubin Total: 0.3 mg/dL (ref 0.0–1.2)
CO2: 25 mmol/L (ref 20–29)
Calcium: 9.4 mg/dL (ref 8.6–10.2)
Chloride: 107 mmol/L — ABNORMAL HIGH (ref 96–106)
Creatinine, Ser: 1.17 mg/dL (ref 0.76–1.27)
GFR calc Af Amer: 74 mL/min/{1.73_m2} (ref >59)
GFR calc non Af Amer: 64 mL/min/{1.73_m2} (ref >59)
Globulin, Total: 2.8 g/dL (ref 1.5–4.5)
Glucose: 120 mg/dL — ABNORMAL HIGH (ref 65–99)
Potassium: 4.4 mmol/L (ref 3.5–5.2)
Sodium: 146 mmol/L — ABNORMAL HIGH (ref 134–144)
Total Protein: 7.5 g/dL (ref 6.0–8.5)

## 2020-01-24 LAB — CARBAMAZEPINE LEVEL, TOTAL: Carbamazepine (Tegretol), S: 5.7 ug/mL (ref 4.0–12.0)

## 2020-01-27 ENCOUNTER — Ambulatory Visit: Payer: Medicare Other | Admitting: Adult Health

## 2020-01-29 ENCOUNTER — Ambulatory Visit: Payer: Medicare Other | Admitting: Endocrinology

## 2020-01-30 ENCOUNTER — Telehealth: Payer: Self-pay

## 2020-01-30 NOTE — Telephone Encounter (Signed)
-----   Message from Ward Givens, NP sent at 01/28/2020  9:21 AM EST ----- Lab work relatively unremarkable. Please call patient

## 2020-01-30 NOTE — Telephone Encounter (Signed)
Attempted to call pt, LVM for labs results per DPR. Ask pt to call back for questions or concerns.

## 2020-02-16 ENCOUNTER — Other Ambulatory Visit: Payer: Self-pay | Admitting: Endocrinology

## 2020-02-16 DIAGNOSIS — E119 Type 2 diabetes mellitus without complications: Secondary | ICD-10-CM

## 2020-02-19 ENCOUNTER — Other Ambulatory Visit: Payer: Self-pay

## 2020-02-20 ENCOUNTER — Other Ambulatory Visit: Payer: Self-pay | Admitting: *Deleted

## 2020-02-20 MED ORDER — PANTOPRAZOLE SODIUM 40 MG PO TBEC: 40.0000 mg | DELAYED_RELEASE_TABLET | Freq: Every day | ORAL | 3 refills | Status: DC

## 2020-02-21 ENCOUNTER — Ambulatory Visit (INDEPENDENT_AMBULATORY_CARE_PROVIDER_SITE_OTHER): Payer: Medicare Other | Admitting: Endocrinology

## 2020-02-21 ENCOUNTER — Other Ambulatory Visit: Payer: Self-pay

## 2020-02-21 VITALS — BP 160/86 | HR 74 | Ht 74.0 in | Wt 225.8 lb

## 2020-02-21 DIAGNOSIS — E1151 Type 2 diabetes mellitus with diabetic peripheral angiopathy without gangrene: Secondary | ICD-10-CM | POA: Diagnosis not present

## 2020-02-21 LAB — POCT GLYCOSYLATED HEMOGLOBIN (HGB A1C): Hemoglobin A1C: 5.9 % — AB (ref 4.0–5.6)
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
## 2020-02-21 NOTE — Patient Instructions (Addendum)
Your blood pressure is high today.  Please see your primary care provider soon, to have it rechecked.   Please continue the same 4 diabetes medications.  check your blood sugar once a day.  vary the time of day when you check, between before the 3 meals, and at bedtime.  also check if you have symptoms of your blood sugar being too high or too low.  please keep a record of the readings and bring it to your next appointment here (or you can bring the meter itself).  You can write it on any piece of paper.  please call us sooner if your blood sugar goes below 70, or if you have a lot of readings over 200.   Please come back for a follow-up appointment in 6 months.    

## 2020-03-04 ENCOUNTER — Other Ambulatory Visit: Payer: Self-pay | Admitting: Family Medicine

## 2020-03-04 DIAGNOSIS — I10 Essential (primary) hypertension: Secondary | ICD-10-CM

## 2020-03-24 ENCOUNTER — Telehealth: Payer: Self-pay | Admitting: Family Medicine

## 2020-03-24 DIAGNOSIS — I10 Essential (primary) hypertension: Secondary | ICD-10-CM

## 2020-03-24 NOTE — Telephone Encounter (Signed)
Medication:  olmesartan (BENICAR) 40 MG tablet [[id number]]      Has the patient contacted their pharmacy?  (If no, request that the patient contact the pharmacy for the refill.) (If yes, when and what did the pharmacy advise?)     Preferred Pharmacy (with phone number or street name): [facility], [place] - [address]  [place], [place] [address]  Phone:  [phone] Fax:  [phone]     Agent: Please be advised that RX refills may take up to 3 business days. We ask that you follow-up with your pharmacy.

## 2020-03-25 MED ORDER — OLMESARTAN MEDOXOMIL 40 MG PO TABS: ORAL_TABLET | ORAL | 3 refills | Status: DC

## 2020-03-25 NOTE — Telephone Encounter (Signed)
Medication refilled
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
## 2020-04-01 ENCOUNTER — Telehealth: Payer: Self-pay | Admitting: Neurology

## 2020-04-01 ENCOUNTER — Encounter: Payer: Self-pay | Admitting: Family Medicine

## 2020-04-01 ENCOUNTER — Other Ambulatory Visit: Payer: Self-pay

## 2020-04-01 ENCOUNTER — Ambulatory Visit (INDEPENDENT_AMBULATORY_CARE_PROVIDER_SITE_OTHER): Payer: Medicare Other | Admitting: Family Medicine

## 2020-04-01 VITALS — BP 130/80 | HR 58 | Temp 98.3°F | Ht 74.0 in | Wt 220.4 lb

## 2020-04-01 DIAGNOSIS — I10 Essential (primary) hypertension: Secondary | ICD-10-CM

## 2020-04-01 DIAGNOSIS — E119 Type 2 diabetes mellitus without complications: Secondary | ICD-10-CM

## 2020-04-01 DIAGNOSIS — E785 Hyperlipidemia, unspecified: Secondary | ICD-10-CM | POA: Diagnosis not present

## 2020-04-01 DIAGNOSIS — Z8659 Personal history of other mental and behavioral disorders: Secondary | ICD-10-CM | POA: Diagnosis not present

## 2020-04-01 NOTE — Patient Instructions (Signed)
Good to see you again today!  I will touch base with your neurology team- they may want to have you come in for evaluation of possible recent seizure Otherwise you are all caught up from my standpoint.  Please see me in about 6 months and take care

## 2020-04-01 NOTE — Telephone Encounter (Signed)
Received a message from PCP regarding the patient and she was wandering if we could evaluate the patient. Called to work him in for a visit with Dr Brett Fairy. There was opening for Monday and Wed next week available at this time. Called to offer these opening's there was no answer. LVM asking the patient to call back about being seen. I have placed those slots on hold for now to give the pt time to call back to offer.  "I saw Andrew [name] today for a routine visit. He related to me an episode that occurred about a month ago. He was asleep, but felt "like something was happening so I tried to wake up." He feels like he may have had some jerking movements, and he clenched his jaws so hard he damaged his front teeth. No incontinence. I was concerned this might represent a seizure and wanted to let you guys know in case he needs to be seen/ have meds adjusted"

## 2020-04-07 ENCOUNTER — Telehealth: Payer: Self-pay | Admitting: Family Medicine

## 2020-04-07 DIAGNOSIS — E119 Type 2 diabetes mellitus without complications: Secondary | ICD-10-CM

## 2020-04-07 MED ORDER — PIOGLITAZONE HCL 15 MG PO TABS: 15.0000 mg | ORAL_TABLET | Freq: Every day | ORAL | 1 refills | Status: DC

## 2020-04-07 NOTE — Telephone Encounter (Signed)
Medication: pioglitazone (ACTOS) 15 MG tablet [[id number]]       Has the patient contacted their pharmacy?  (If no, request that the patient contact the pharmacy for the refill.) (If yes, when and what did the pharmacy advise?)     Preferred Pharmacy (with phone number or street name): [facility] [address]  [place], [place] [address]  Phone:  [phone] Fax:  [phone]      Agent: Please be advised that RX refills may take up to 3 business days. We ask that you follow-up with your pharmacy.

## 2020-04-07 NOTE — Telephone Encounter (Signed)
Medication sent to pharmacy  

## 2020-04-08 ENCOUNTER — Encounter: Payer: Self-pay | Admitting: Neurology

## 2020-04-08 ENCOUNTER — Telehealth: Payer: Self-pay | Admitting: Neurology

## 2020-04-08 ENCOUNTER — Ambulatory Visit (INDEPENDENT_AMBULATORY_CARE_PROVIDER_SITE_OTHER): Payer: Medicare Other | Admitting: Neurology

## 2020-04-08 VITALS — BP 177/92 | HR 58 | Ht 74.0 in | Wt 225.0 lb

## 2020-04-08 DIAGNOSIS — R4189 Other symptoms and signs involving cognitive functions and awareness: Secondary | ICD-10-CM | POA: Diagnosis not present

## 2020-04-08 DIAGNOSIS — G4763 Sleep related bruxism: Secondary | ICD-10-CM | POA: Insufficient documentation

## 2020-04-08 DIAGNOSIS — G514 Facial myokymia: Secondary | ICD-10-CM | POA: Diagnosis not present

## 2020-04-08 DIAGNOSIS — Z8673 Personal history of transient ischemic attack (TIA), and cerebral infarction without residual deficits: Secondary | ICD-10-CM | POA: Diagnosis not present

## 2020-04-08 DIAGNOSIS — R9082 White matter disease, unspecified: Secondary | ICD-10-CM | POA: Diagnosis not present

## 2020-04-08 DIAGNOSIS — G4751 Confusional arousals: Secondary | ICD-10-CM | POA: Insufficient documentation

## 2020-04-08 MED ORDER — ALPRAZOLAM 0.5 MG PO TABS: ORAL_TABLET | ORAL | 0 refills | Status: DC

## 2020-04-08 NOTE — Patient Instructions (Signed)
Seizure, Adult A seizure is a sudden burst of abnormal electrical and chemical activity in the brain. Seizures usually last from 30 seconds to 2 minutes.  What are the causes? Common causes of this condition include:  Fever or infection.  Problems that affect the brain. These may include: ? A brain or head injury. ? Bleeding in the brain. ? A brain tumor.  Low levels of blood sugar or salt.  Kidney problems or liver problems.  Conditions that are passed from parent to child (are inherited).  Problems with a substance, such as: ? Having a reaction to a drug or a medicine. ? Stopping the use of a substance all of a sudden (withdrawal).  A stroke.  Disorders that affect how you develop. Sometimes, the cause may not be known.  What increases the risk?  Having someone in your family who has epilepsy. In this condition, seizures happen again and again over time. They have no clear cause.  Having had a tonic-clonic seizure before. This type of seizure causes you to: ? Tighten the muscles of the whole body. ? Lose consciousness.  Having had a head injury or strokes before.  Having had a lack of oxygen at birth. What are the signs or symptoms? There are many types of seizures. The symptoms vary depending on the type of seizure you have. Symptoms during a seizure  Shaking that you cannot control (convulsions) with fast, jerky movements of muscles.  Stiffness of the body.  Breathing problems.  Feeling mixed up (confused).  Staring or not responding to sound or touch.  Head nodding.  Eyes that blink, flutter, or move fast.  Drooling, grunting, or making clicking sounds with your mouth  Losing control of when you pee or poop. Symptoms before a seizure  Feeling afraid, nervous, or worried.  Feeling like you may vomit.  Feeling like: ? You are moving when you are not. ? Things around you are moving when they are not.  Feeling like you saw or heard something before  (dj vu).  Odd tastes or smells.  Changes in how you see. You may see flashing lights or spots. Symptoms after a seizure  Feeling confused.  Feeling sleepy.  Headache.  Sore muscles. How is this treated? If your seizure stops on its own, you will not need treatment. If your seizure lasts longer than 5 minutes, you will normally need treatment. Treatment may include:  Medicines given through an IV tube.  Avoiding things, such as medicines, that are known to cause your seizures.  Medicines to prevent seizures.  A device to prevent or control seizures.  Surgery.  A diet low in carbohydrates and high in fat (ketogenic diet). Follow these instructions at home: Medicines  Take over-the-counter and prescription medicines only as told by your doctor.  Avoid foods or drinks that may keep your medicine from working, such as alcohol. Activity  Follow instructions about driving, swimming, or doing things that would be dangerous if you had another seizure. Wait until your doctor says it is safe for you to do these things.  If you live in the [place] ask your local department of motor vehicles when you can drive.  Get a lot of rest. Teaching others  Teach friends and family what to do when you have a seizure. They should: ? Help you get down to the ground. ? Protect your head and body. ? Loosen any clothing around your neck. ? Turn you on your side. ? Know whether or not   you need emergency care. ? Stay with you until you are better.  Also, tell them what not to do if you have a seizure. Tell them: ? They should not hold you down. ? They should not put anything in your mouth.   General instructions  Avoid anything that gives you seizures.  Keep a seizure diary. Write down: ? What you remember about each seizure. ? What you think caused each seizure.  Keep all follow-up visits. Contact a doctor if:  You have another seizure or seizures. Call the doctor each time you  have a seizure.  The pattern of your seizures changes.  You keep having seizures with treatment.  You have symptoms of being sick or having an infection.  You are not able to take your medicine. Get help right away if:  You have any of these problems: ? A seizure that lasts longer than 5 minutes. ? Many seizures in a row and you do not feel better between seizures. ? A seizure that makes it harder to breathe. ? A seizure and you can no longer speak or use part of your body.  You do not wake up right after a seizure.  You get hurt during a seizure.  You feel confused or have pain right after a seizure. These symptoms may be an emergency. Get help right away. Call your local emergency services (911 in the [place]  Do not wait to see if the symptoms will go away.  Do not drive yourself to the hospital. Summary  A seizure is a sudden burst of abnormal electrical and chemical activity in the brain. Seizures normally last from 30 seconds to 2 minutes.  Causes of seizures include illness, injury to the head, low levels of blood sugar or salt, and certain conditions.  Most seizures will stop on their own in less than 5 minutes. Seizures that last longer than 5 minutes are a medical emergency and need treatment right away.  Many medicines are used to treat seizures. Take over-the-counter and prescription medicines only as told by your doctor. This information is not intended to replace advice given to you by your health care provider. Make sure you discuss any questions you have with your health care provider. Document Revised: 07/05/2019 Document Reviewed: 07/05/2019 Elsevier Patient Education  2021 [employer]  
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
## 2020-04-08 NOTE — Addendum Note (Signed)
Addended by: Larey Seat on: 04/08/2020 11:41 AM   Modules accepted: Orders

## 2020-04-08 NOTE — Telephone Encounter (Signed)
Medicare/bcbs fed order sent to GI. No auth they will reach out to the patient to schedule.  °

## 2020-04-21 ENCOUNTER — Ambulatory Visit
Admission: RE | Admit: 2020-04-21 | Discharge: 2020-04-21 | Disposition: A | Discharge status: Home / Self Care | Payer: Medicare Other | Source: Ambulatory Visit | Attending: Neurology | Admitting: Neurology

## 2020-04-21 DIAGNOSIS — G4763 Sleep related bruxism: Secondary | ICD-10-CM

## 2020-04-21 DIAGNOSIS — G514 Facial myokymia: Secondary | ICD-10-CM

## 2020-04-21 DIAGNOSIS — G4751 Confusional arousals: Secondary | ICD-10-CM | POA: Diagnosis not present

## 2020-04-21 DIAGNOSIS — Z8673 Personal history of transient ischemic attack (TIA), and cerebral infarction without residual deficits: Secondary | ICD-10-CM | POA: Diagnosis not present

## 2020-04-21 DIAGNOSIS — R569 Unspecified convulsions: Secondary | ICD-10-CM | POA: Diagnosis not present

## 2020-04-21 DIAGNOSIS — R4189 Other symptoms and signs involving cognitive functions and awareness: Secondary | ICD-10-CM

## 2020-04-21 MED ORDER — GADOBENATE DIMEGLUMINE 529 MG/ML IV SOLN
20.0000 mL | Freq: Once | INTRAVENOUS | Status: AC | PRN
  Administered 2020-04-21: 20 mL via INTRAVENOUS

## 2020-04-23 NOTE — Progress Notes (Signed)
On coronal views no mesial temporal sclerosis or hippocampal atrophy,the posterior fossa, pituitary gland and corpus callosum are unremarkable. No evidence of intracranial hemorrhage on SWI views. The orbits and their contents, paranasal sinuses and calvarium are unremarkable.  Intracranial flow voids are present.  IMPRESSION:   Normal MRI brain (with and without).   Penni Bombard, MD. 04-21-2020

## 2020-04-27 ENCOUNTER — Telehealth: Payer: Self-pay | Admitting: Neurology

## 2020-04-27 DIAGNOSIS — H401121 Primary open-angle glaucoma, left eye, mild stage: Secondary | ICD-10-CM | POA: Diagnosis not present

## 2020-04-27 DIAGNOSIS — H40032 Anatomical narrow angle, left eye: Secondary | ICD-10-CM | POA: Diagnosis not present

## 2020-04-27 LAB — HM DIABETES EYE EXAM

## 2020-04-27 NOTE — Telephone Encounter (Signed)
Called the patient and reviewed the normal MRI result with him. He verbalized understanding and had no further questions. Pt was appreciative for the call.

## 2020-04-27 NOTE — Telephone Encounter (Signed)
-----   Message from Larey Seat, MD sent at 04/23/2020  3:56 PM EDT ----- On coronal views no mesial temporal sclerosis or hippocampal atrophy,the posterior fossa, pituitary gland and corpus callosum are unremarkable. No evidence of intracranial hemorrhage on SWI views. The orbits and their contents, paranasal sinuses and calvarium are unremarkable.  Intracranial flow voids are present.  IMPRESSION:   Normal MRI brain (with and without).   Penni Bombard, MD. 04-21-2020

## 2020-04-30 ENCOUNTER — Other Ambulatory Visit: Payer: Self-pay | Admitting: Family Medicine

## 2020-04-30 ENCOUNTER — Telehealth: Payer: Self-pay | Admitting: Family Medicine

## 2020-04-30 DIAGNOSIS — E785 Hyperlipidemia, unspecified: Secondary | ICD-10-CM

## 2020-04-30 NOTE — Telephone Encounter (Signed)
Medication: atorvastatin (LIPITOR) 20 MG tablet    Has the patient contacted their pharmacy? No. (If no, request that the patient contact the pharmacy for the refill.) (If yes, when and what did the pharmacy advise?)  Preferred Pharmacy (with phone number or street name):  [facility], [place] - [address]  [address] [place] [address]  Phone:  [phone] Fax:  [phone]  DEA #:  [id number] Agent: Please be advised that RX refills may take up to 3 business days. We ask that you follow-up with your pharmacy.

## 2020-05-01 NOTE — Telephone Encounter (Signed)
Medication refilled

## 2020-05-05 ENCOUNTER — Telehealth: Payer: Self-pay | Admitting: Family Medicine

## 2020-05-05 NOTE — Telephone Encounter (Signed)
Error

## 2020-06-22 ENCOUNTER — Other Ambulatory Visit: Payer: Self-pay | Admitting: Endocrinology

## 2020-07-07 ENCOUNTER — Other Ambulatory Visit: Payer: Self-pay | Admitting: Neurology

## 2020-07-07 DIAGNOSIS — R4189 Other symptoms and signs involving cognitive functions and awareness: Secondary | ICD-10-CM

## 2020-07-07 DIAGNOSIS — G514 Facial myokymia: Secondary | ICD-10-CM

## 2020-07-07 DIAGNOSIS — G4751 Confusional arousals: Secondary | ICD-10-CM

## 2020-07-08 ENCOUNTER — Ambulatory Visit: Payer: Medicare Other | Admitting: Neurology

## 2020-07-21 ENCOUNTER — Ambulatory Visit (INDEPENDENT_AMBULATORY_CARE_PROVIDER_SITE_OTHER): Payer: Medicare Other | Admitting: Neurology

## 2020-07-21 DIAGNOSIS — R252 Cramp and spasm: Secondary | ICD-10-CM | POA: Diagnosis not present

## 2020-07-21 DIAGNOSIS — R4189 Other symptoms and signs involving cognitive functions and awareness: Secondary | ICD-10-CM

## 2020-07-21 DIAGNOSIS — G4751 Confusional arousals: Secondary | ICD-10-CM

## 2020-07-21 DIAGNOSIS — G514 Facial myokymia: Secondary | ICD-10-CM

## 2020-08-03 ENCOUNTER — Telehealth: Payer: Self-pay | Admitting: Family Medicine

## 2020-08-03 NOTE — Procedures (Signed)
This is an EEG recording with a duration of 25 minutes and 7 seconds performed on 21 July 2020 at [facility].  The technologist advised me that due to a medical history hyperventilation and photic stimulation were not performed.  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  He used to be followed by Dr. Erling Cruz.  He often reported vivid dreams some bruxism but most recently he woke up from sleep after a nightmare could not move his body felt sleep paralyzed, and felt actually tense but not flaccid.  His right eyelid was twitching the right side of the face seem to be twitching but he also woke up with a chipped tooth and his muscles were sore all over.  He has no previous history of seizures but his primary care physician worried that this may have been a possible manifestation of such.  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  This EEG was recorded under the international 10-20 system of electrode placement without associated synchronous video recording.  21 electrodes were used 1 to document the heartbeats.  A posterior dominant rhythm was established with a low amplitude after eye closure at 10 Hz.  The EKG remained between 62 and 70 bpm and regular rhythm apparently sinus.  Since hyperventilation and photic stimulation were not performed the patient seemed to have gone in and out of drowsiness but there was no sleep recorded or captured.  The patient remained alert and oriented and the technician asked him questions during the EEG recording.  No epileptiform activity was noted.  Conclusion this is a normal EEG.  Larey Seat, MD

## 2020-08-03 NOTE — Progress Notes (Signed)
Since hyperventilation and photic stimulation were not performed the patient seemed to have gone in and out of drowsiness but there was no sleep recorded or captured.  The patient remained alert and oriented and the technician asked him questions during the EEG recording.  No epileptiform activity was noted.  Conclusion this is a normal EEG.

## 2020-08-03 NOTE — Telephone Encounter (Signed)
Pt dropped off document to be filled out (Disability Parking Placard form - 1 page) Pt would like to be called when document ready to pick up at [facility] [phone]. Document put at front office tray under providers name.

## 2020-08-03 NOTE — Telephone Encounter (Signed)
Form completed. Pt wishes to pick up. Will be left at front for him

## 2020-08-03 NOTE — Telephone Encounter (Signed)
Forms have been received and given to provider.

## 2020-08-04 ENCOUNTER — Other Ambulatory Visit: Payer: Self-pay

## 2020-08-04 ENCOUNTER — Other Ambulatory Visit: Payer: Self-pay | Admitting: Neurology

## 2020-08-04 ENCOUNTER — Ambulatory Visit (INDEPENDENT_AMBULATORY_CARE_PROVIDER_SITE_OTHER): Payer: Medicare Other | Admitting: Neurology

## 2020-08-04 VITALS — BP 157/86 | HR 62 | Ht 74.0 in | Wt 222.0 lb

## 2020-08-04 DIAGNOSIS — R9082 White matter disease, unspecified: Secondary | ICD-10-CM | POA: Diagnosis not present

## 2020-08-04 DIAGNOSIS — R209 Unspecified disturbances of skin sensation: Secondary | ICD-10-CM

## 2020-08-04 DIAGNOSIS — G459 Transient cerebral ischemic attack, unspecified: Secondary | ICD-10-CM | POA: Diagnosis not present

## 2020-08-04 DIAGNOSIS — R634 Abnormal weight loss: Secondary | ICD-10-CM

## 2020-08-04 DIAGNOSIS — G4763 Sleep related bruxism: Secondary | ICD-10-CM

## 2020-08-04 MED ORDER — CARBAMAZEPINE 200 MG PO TABS: ORAL_TABLET | ORAL | 3 refills | Status: DC

## 2020-08-04 NOTE — Patient Instructions (Signed)
No driving restrictions.
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
## 2020-08-06 LAB — CBC WITH DIFFERENTIAL/PLATELET
Basophils Absolute: 0 10*3/uL (ref 0.0–0.2)
Basos: 1 %
EOS (ABSOLUTE): 0.1 10*3/uL (ref 0.0–0.4)
Eos: 3 %
Hematocrit: 39.5 % (ref 37.5–51.0)
Hemoglobin: 12.7 g/dL — ABNORMAL LOW (ref 13.0–17.7)
Immature Grans (Abs): 0 10*3/uL (ref 0.0–0.1)
Immature Granulocytes: 0 %
Lymphocytes Absolute: 1.6 10*3/uL (ref 0.7–3.1)
Lymphs: 32 %
MCH: 27.9 pg (ref 26.6–33.0)
MCHC: 32.2 g/dL (ref 31.5–35.7)
MCV: 87 fL (ref 79–97)
Monocytes Absolute: 0.5 10*3/uL (ref 0.1–0.9)
Monocytes: 11 %
Neutrophils Absolute: 2.6 10*3/uL (ref 1.4–7.0)
Neutrophils: 53 %
Platelets: 157 10*3/uL (ref 150–450)
RBC: 4.55 x10E6/uL (ref 4.14–5.80)
RDW: 12.5 % (ref 11.6–15.4)
WBC: 4.9 10*3/uL (ref 3.4–10.8)

## 2020-08-06 LAB — COMPREHENSIVE METABOLIC PANEL
ALT: 14 IU/L (ref 0–44)
AST: 12 IU/L (ref 0–40)
Albumin/Globulin Ratio: 2 (ref 1.2–2.2)
Albumin: 4.6 g/dL (ref 3.8–4.8)
Alkaline Phosphatase: 96 IU/L (ref 44–121)
BUN/Creatinine Ratio: 11 (ref 10–24)
BUN: 11 mg/dL (ref 8–27)
Bilirubin Total: 0.3 mg/dL (ref 0.0–1.2)
CO2: 27 mmol/L (ref 20–29)
Calcium: 8.8 mg/dL (ref 8.6–10.2)
Chloride: 109 mmol/L — ABNORMAL HIGH (ref 96–106)
Creatinine, Ser: 0.97 mg/dL (ref 0.76–1.27)
Globulin, Total: 2.3 g/dL (ref 1.5–4.5)
Glucose: 116 mg/dL — ABNORMAL HIGH (ref 65–99)
Potassium: 4.2 mmol/L (ref 3.5–5.2)
Sodium: 148 mmol/L — ABNORMAL HIGH (ref 134–144)
Total Protein: 6.9 g/dL (ref 6.0–8.5)
eGFR: 85 mL/min/{1.73_m2} (ref >59)

## 2020-08-06 LAB — CARBAMAZEPINE, FREE AND TOTAL

## 2020-08-09 ENCOUNTER — Encounter (HOSPITAL_BASED_OUTPATIENT_CLINIC_OR_DEPARTMENT_OTHER): Payer: Self-pay | Admitting: Emergency Medicine

## 2020-08-09 ENCOUNTER — Emergency Department (HOSPITAL_BASED_OUTPATIENT_CLINIC_OR_DEPARTMENT_OTHER)
Admission: EM | Admit: 2020-08-09 | Discharge: 2020-08-09 | Disposition: A | Discharge status: Home / Self Care | Payer: Medicare Other | Attending: Emergency Medicine | Admitting: Emergency Medicine

## 2020-08-09 ENCOUNTER — Other Ambulatory Visit: Payer: Self-pay

## 2020-08-09 DIAGNOSIS — Z79899 Other long term (current) drug therapy: Secondary | ICD-10-CM | POA: Diagnosis not present

## 2020-08-09 DIAGNOSIS — Z8673 Personal history of transient ischemic attack (TIA), and cerebral infarction without residual deficits: Secondary | ICD-10-CM | POA: Diagnosis not present

## 2020-08-09 DIAGNOSIS — Z91040 Latex allergy status: Secondary | ICD-10-CM | POA: Insufficient documentation

## 2020-08-09 DIAGNOSIS — U071 COVID-19: Secondary | ICD-10-CM

## 2020-08-09 DIAGNOSIS — Z7984 Long term (current) use of oral hypoglycemic drugs: Secondary | ICD-10-CM | POA: Diagnosis not present

## 2020-08-09 DIAGNOSIS — E1169 Type 2 diabetes mellitus with other specified complication: Secondary | ICD-10-CM | POA: Insufficient documentation

## 2020-08-09 DIAGNOSIS — I10 Essential (primary) hypertension: Secondary | ICD-10-CM | POA: Insufficient documentation

## 2020-08-09 DIAGNOSIS — R059 Cough, unspecified: Secondary | ICD-10-CM | POA: Diagnosis present

## 2020-08-09 DIAGNOSIS — Z7902 Long term (current) use of antithrombotics/antiplatelets: Secondary | ICD-10-CM | POA: Diagnosis not present
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
## 2020-08-09 NOTE — Discharge Instructions (Addendum)
You will need to isolate at home for 5 days since the onset of your symptoms on Friday.  If you are not running any fevers and your symptoms have begun to improve you may leave the home with a mask.  Unfortunately have multiple medications that interact with the Paxlovid this is not an excellent choice given your medication list.  Please take over-the-counter cold medication to control your cough such as Coricidin or other approved cold medication safe for use with high blood pressure.  If you are unsure of what medications you may take you may ask any pharmacist at any drugstore.  They can assist you with this.  Make sure to drink plenty of water.  Return if you begin having difficulty breathing, uncontrolled fevers, severe weakness, chest pain shortness of breath, loss of consciousness, severe diarrhea that is uncontrolled with medications you have tried over-the-counter.  Or any other new or worsening complaints.

## 2020-08-09 NOTE — ED Triage Notes (Signed)
Pt arrives pov with c/o cough and nasal congestion. Home covid test positive, want to be tested.Wife is Covid +

## 2020-08-09 NOTE — ED Notes (Addendum)
Wife here yesterday positive for COVID.  Home test ;positive for COVID.  Friday started having cough, runny nose, and body aches.

## 2020-08-10 ENCOUNTER — Telehealth: Payer: Self-pay | Admitting: *Deleted

## 2020-08-10 ENCOUNTER — Telehealth: Payer: Self-pay | Admitting: Neurology

## 2020-08-10 NOTE — Telephone Encounter (Signed)
-----   Message from Larey Seat, MD sent at 08/10/2020 12:19 PM EDT ----- Slight decline, mild anemia-   High sodium and chloride, high glucose have been present for years. Marland Kitchen

## 2020-08-10 NOTE — Telephone Encounter (Deleted)
-----   Message from Larey Seat, MD sent at 08/10/2020 12:19 PM EDT ----- Slight decline, mild anemia-   High sodium and chloride, high glucose have been present for years. Marland Kitchen

## 2020-08-10 NOTE — Progress Notes (Signed)
Slight decline, mild anemia-   High sodium and chloride, high glucose have been present for years. Andrew [name]

## 2020-08-10 NOTE — Telephone Encounter (Signed)
error 

## 2020-08-10 NOTE — Telephone Encounter (Signed)
Called the patient and advised the results. Pt asked that a copy of the results be mailed to him.

## 2020-08-12 ENCOUNTER — Other Ambulatory Visit: Payer: Self-pay | Admitting: Family Medicine

## 2020-08-12 DIAGNOSIS — J309 Allergic rhinitis, unspecified: Secondary | ICD-10-CM
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
## 2020-08-19 ENCOUNTER — Encounter: Payer: Self-pay | Admitting: Family Medicine

## 2020-08-19 ENCOUNTER — Ambulatory Visit (INDEPENDENT_AMBULATORY_CARE_PROVIDER_SITE_OTHER): Payer: Medicare Other | Admitting: Family Medicine

## 2020-08-19 ENCOUNTER — Other Ambulatory Visit: Payer: Self-pay

## 2020-08-19 VITALS — BP 125/80 | HR 89 | Temp 97.7°F | Resp 15 | Ht 74.0 in | Wt 216.0 lb

## 2020-08-19 DIAGNOSIS — U071 COVID-19: Secondary | ICD-10-CM

## 2020-08-19 DIAGNOSIS — R0981 Nasal congestion: Secondary | ICD-10-CM

## 2020-08-19 MED ORDER — PREDNISONE 20 MG PO TABS: ORAL_TABLET | ORAL | 0 refills | Status: DC

## 2020-08-19 NOTE — Patient Instructions (Signed)
Good to see you today- I hope that your daughter is feeling better soon! For nasal congestion, we can use prednisone 20 mg daily for 5 days. This may raise your blood sugars- please do keep an eye on this and let me know if going higher than 300 or so!    Let me know if this does not clear up your symptoms
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
## 2020-08-24 DIAGNOSIS — R972 Elevated prostate specific antigen [PSA]: Secondary | ICD-10-CM | POA: Diagnosis not present

## 2020-08-26 ENCOUNTER — Other Ambulatory Visit: Payer: Self-pay

## 2020-08-26 ENCOUNTER — Ambulatory Visit (INDEPENDENT_AMBULATORY_CARE_PROVIDER_SITE_OTHER): Payer: Medicare Other | Admitting: Physician Assistant

## 2020-08-26 ENCOUNTER — Encounter: Payer: Self-pay | Admitting: Physician Assistant

## 2020-08-26 DIAGNOSIS — I10 Essential (primary) hypertension: Secondary | ICD-10-CM | POA: Diagnosis not present

## 2020-08-26 DIAGNOSIS — G459 Transient cerebral ischemic attack, unspecified: Secondary | ICD-10-CM

## 2020-08-26 DIAGNOSIS — E785 Hyperlipidemia, unspecified: Secondary | ICD-10-CM

## 2020-08-26 NOTE — Patient Instructions (Signed)
Medication Instructions:  Your physician recommends that you continue on your current medications as directed. Please refer to the Current Medication list given to you today.  *If you need a refill on your cardiac medications before your next appointment, please call your pharmacy*   Lab Work: None ordered   If you have labs (blood work) drawn today and your tests are completely normal, you will receive your results only by: [facility] (if you have MyChart) OR A paper copy in the mail If you have any lab test that is abnormal or we need to change your treatment, we will call you to review the results.   Testing/Procedures: None ordered    Follow-Up: At [facility], you and your health needs are our priority.  As part of our continuing mission to provide you with exceptional heart care, we have created designated Provider Care Teams.  These Care Teams include your primary Cardiologist (physician) and Advanced Practice Providers (APPs -  Physician Assistants and Nurse Practitioners) who all work together to provide you with the care you need, when you need it.  We recommend signing up for the patient portal called "MyChart".  Sign up information is provided on this After Visit Summary.  MyChart is used to connect with patients for Virtual Visits (Telemedicine).  Patients are able to view lab/test results, encounter notes, upcoming appointments, etc.  Non-urgent messages can be sent to your provider as well.   To learn more about what you can do with MyChart, go to [email or web address].    Your next appointment:   12 month(s)  The format for your next appointment:   In Person  Provider:   You may see Dorris Carnes, MD or one of the following Advanced Practice Providers on your designated Care Team:   Richardson Dopp, PA-C Robbie Lis, [place]   Other Instructions None

## 2020-08-31 DIAGNOSIS — R972 Elevated prostate specific antigen [PSA]: Secondary | ICD-10-CM | POA: Diagnosis not present

## 2020-08-31 DIAGNOSIS — N5201 Erectile dysfunction due to arterial insufficiency: Secondary | ICD-10-CM | POA: Diagnosis not present

## 2020-09-02 ENCOUNTER — Other Ambulatory Visit: Payer: Self-pay

## 2020-09-02 ENCOUNTER — Ambulatory Visit (INDEPENDENT_AMBULATORY_CARE_PROVIDER_SITE_OTHER): Payer: Medicare Other | Admitting: Endocrinology

## 2020-09-02 VITALS — BP 160/86 | HR 66 | Ht 74.0 in | Wt 217.8 lb

## 2020-09-02 DIAGNOSIS — E1151 Type 2 diabetes mellitus with diabetic peripheral angiopathy without gangrene: Secondary | ICD-10-CM | POA: Diagnosis not present

## 2020-09-02 LAB — POCT GLYCOSYLATED HEMOGLOBIN (HGB A1C): Hemoglobin A1C: 6.2 % — AB (ref 4.0–5.6)

## 2020-09-02 NOTE — Patient Instructions (Addendum)
Your blood pressure is high today.  Please see your primary care provider soon, to have it rechecked.   Please continue the same 4 diabetes medications.  check your blood sugar once a day.  vary the time of day when you check, between before the 3 meals, and at bedtime.  also check if you have symptoms of your blood sugar being too high or too low.  please keep a record of the readings and bring it to your next appointment here (or you can bring the meter itself).  You can write it on any piece of paper.  please call us sooner if your blood sugar goes below 70, or if you have a lot of readings over 200.   Please come back for a follow-up appointment in 6 months.
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
## 2020-09-12 ENCOUNTER — Other Ambulatory Visit: Payer: Self-pay | Admitting: Endocrinology

## 2020-09-12 ENCOUNTER — Other Ambulatory Visit: Payer: Self-pay | Admitting: Internal Medicine
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
## 2020-10-02 NOTE — Patient Instructions (Addendum)
Good to see you again today- assuming all is well please see me in about 4 months If not done already please consider getting the shingles vaccine and the new covid shot  Flu vaccine today

## 2020-10-05 ENCOUNTER — Encounter: Payer: Self-pay | Admitting: Family Medicine

## 2020-10-05 ENCOUNTER — Other Ambulatory Visit: Payer: Self-pay

## 2020-10-05 ENCOUNTER — Ambulatory Visit: Payer: Medicare Other | Attending: Internal Medicine

## 2020-10-05 ENCOUNTER — Ambulatory Visit (INDEPENDENT_AMBULATORY_CARE_PROVIDER_SITE_OTHER): Payer: Medicare Other | Admitting: Family Medicine

## 2020-10-05 VITALS — BP 132/82 | HR 66 | Temp 97.7°F | Resp 18 | Ht 74.0 in | Wt 219.0 lb

## 2020-10-05 DIAGNOSIS — E119 Type 2 diabetes mellitus without complications: Secondary | ICD-10-CM

## 2020-10-05 DIAGNOSIS — I10 Essential (primary) hypertension: Secondary | ICD-10-CM | POA: Diagnosis not present

## 2020-10-05 DIAGNOSIS — Z23 Encounter for immunization: Secondary | ICD-10-CM

## 2020-10-05 MED ORDER — PIOGLITAZONE HCL 15 MG PO TABS: 15.0000 mg | ORAL_TABLET | Freq: Every day | ORAL | 3 refills | Status: DC

## 2020-10-05 NOTE — Progress Notes (Signed)
   Covid-19 Vaccination Clinic  Name:  [name]    MRN: [id number] DOB: July 09, 1950  10/05/2020  Mr. Mall was observed post Covid-19 immunization for 15 minutes without incident. He was provided with Vaccine Information Sheet and instruction to access the V-Safe system.   Mr. Arduini was instructed to call [phone] with any severe reactions post vaccine: Difficulty breathing  Swelling of face and throat  A fast heartbeat  A bad rash all over body  Dizziness and weakness

## 2020-10-06 ENCOUNTER — Other Ambulatory Visit: Payer: Self-pay | Admitting: Family Medicine

## 2020-10-06 DIAGNOSIS — E119 Type 2 diabetes mellitus without complications: Secondary | ICD-10-CM

## 2020-10-12 ENCOUNTER — Other Ambulatory Visit (HOSPITAL_BASED_OUTPATIENT_CLINIC_OR_DEPARTMENT_OTHER): Payer: Self-pay

## 2020-10-12 DIAGNOSIS — Z23 Encounter for immunization: Secondary | ICD-10-CM | POA: Diagnosis not present

## 2020-10-12 MED ORDER — COVID-19MRNA BIVAL VACC PFIZER 30 MCG/0.3ML IM SUSP
INTRAMUSCULAR | 0 refills | Status: DC
  Filled 2020-10-12: qty 0.3, 1d supply, fill #0

## 2020-10-13 ENCOUNTER — Other Ambulatory Visit (HOSPITAL_BASED_OUTPATIENT_CLINIC_OR_DEPARTMENT_OTHER): Payer: Self-pay
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
## 2020-12-15 ENCOUNTER — Ambulatory Visit (INDEPENDENT_AMBULATORY_CARE_PROVIDER_SITE_OTHER): Payer: Medicare Other

## 2020-12-15 VITALS — BP 148/80 | HR 76 | Temp 98.4°F | Resp 16 | Ht 74.0 in | Wt 224.8 lb

## 2020-12-15 DIAGNOSIS — Z0000 Encounter for general adult medical examination without abnormal findings: Secondary | ICD-10-CM

## 2020-12-15 NOTE — Patient Instructions (Signed)
Andrew [name] , Thank you for taking time to come for your Medicare Wellness Visit. I appreciate your ongoing commitment to your health goals. Please review the following plan we discussed and let me know if I can assist you in the future.   Screening recommendations/referrals: Colonoscopy: Completed 11/21/2019-Due 11/21/2026 Recommended yearly ophthalmology/optometry visit for glaucoma screening and checkup Recommended yearly dental visit for hygiene and checkup  Vaccinations: Influenza vaccine: Up to date Pneumococcal vaccine: Up to date Tdap vaccine: Up to date Shingles vaccine: Discuss with pharmacy   Covid-19: Up to date  Advanced directives: Information given today.  Conditions/risks identified: See problem list  Next appointment: Follow up in one year for your annual wellness visit. 12/21/2021 @ 11:40.  Preventive Care 20 Years and Older, Male Preventive care refers to lifestyle choices and visits with your health care provider that can promote health and wellness. What does preventive care include? A yearly physical exam. This is also called an annual well check. Dental exams once or twice a year. Routine eye exams. Ask your health care provider how often you should have your eyes checked. Personal lifestyle choices, including: Daily care of your teeth and gums. Regular physical activity. Eating a healthy diet. Avoiding tobacco and drug use. Limiting alcohol use. Practicing safe sex. Taking low doses of aspirin every day. Taking vitamin and mineral supplements as recommended by your health care provider. What happens during an annual well check? The services and screenings done by your health care provider during your annual well check will depend on your age, overall health, lifestyle risk factors, and family history of disease. Counseling  Your health care provider may ask you questions about your: Alcohol use. Tobacco use. Drug use. Emotional well-being. Home and  relationship well-being. Sexual activity. Eating habits. History of falls. Memory and ability to understand (cognition). Work and work Statistician. Screening  You may have the following tests or measurements: Height, weight, and BMI. Blood pressure. Lipid and cholesterol levels. These may be checked every 5 years, or more frequently if you are over 45 years old. Skin check. Lung cancer screening. You may have this screening every year starting at age 48 if you have a 30-pack-year history of smoking and currently smoke or have quit within the past 15 years. Fecal occult blood test (FOBT) of the stool. You may have this test every year starting at age 82. Flexible sigmoidoscopy or colonoscopy. You may have a sigmoidoscopy every 5 years or a colonoscopy every 10 years starting at age 14. Prostate cancer screening. Recommendations will vary depending on your family history and other risks. Hepatitis C blood test. Hepatitis B blood test. Sexually transmitted disease (STD) testing. Diabetes screening. This is done by checking your blood sugar (glucose) after you have not eaten for a while (fasting). You may have this done every 1-3 years. Abdominal aortic aneurysm (AAA) screening. You may need this if you are a current or former smoker. Osteoporosis. You may be screened starting at age 10 if you are at high risk. Talk with your health care provider about your test results, treatment options, and if necessary, the need for more tests. Vaccines  Your health care provider may recommend certain vaccines, such as: Influenza vaccine. This is recommended every year. Tetanus, diphtheria, and acellular pertussis (Tdap, Td) vaccine. You may need a Td booster every 10 years. Zoster vaccine. You may need this after age 58. Pneumococcal 13-valent conjugate (PCV13) vaccine. One dose is recommended after age 68. Pneumococcal polysaccharide (PPSV23) vaccine. One  dose is recommended after age 47. Talk to your  health care provider about which screenings and vaccines you need and how often you need them. This information is not intended to replace advice given to you by your health care provider. Make sure you discuss any questions you have with your health care provider. Document Released: 01/23/2015 Document Revised: 09/16/2015 Document Reviewed: 10/28/2014 Elsevier Interactive Patient Education  2017 [place] Prevention in the Home Falls can cause injuries. They can happen to people of all ages. There are many things you can do to make your home safe and to help prevent falls. What can I do on the outside of my home? Regularly fix the edges of walkways and driveways and fix any cracks. Remove anything that might make you trip as you walk through a door, such as a raised step or threshold. Trim any bushes or trees on the path to your home. Use bright outdoor lighting. Clear any walking paths of anything that might make someone trip, such as rocks or tools. Regularly check to see if handrails are loose or broken. Make sure that both sides of any steps have handrails. Any raised decks and porches should have guardrails on the edges. Have any leaves, snow, or ice cleared regularly. Use sand or salt on walking paths during winter. Clean up any spills in your garage right away. This includes oil or grease spills. What can I do in the bathroom? Use night lights. Install grab bars by the toilet and in the tub and shower. Do not use towel bars as grab bars. Use non-skid mats or decals in the tub or shower. If you need to sit down in the shower, use a plastic, non-slip stool. Keep the floor dry. Clean up any water that spills on the floor as soon as it happens. Remove soap buildup in the tub or shower regularly. Attach bath mats securely with double-sided non-slip rug tape. Do not have throw rugs and other things on the floor that can make you trip. What can I do in the bedroom? Use night  lights. Make sure that you have a light by your bed that is easy to reach. Do not use any sheets or blankets that are too big for your bed. They should not hang down onto the floor. Have a firm chair that has side arms. You can use this for support while you get dressed. Do not have throw rugs and other things on the floor that can make you trip. What can I do in the kitchen? Clean up any spills right away. Avoid walking on wet floors. Keep items that you use a lot in easy-to-reach places. If you need to reach something above you, use a strong step stool that has a grab bar. Keep electrical cords out of the way. Do not use floor polish or wax that makes floors slippery. If you must use wax, use non-skid floor wax. Do not have throw rugs and other things on the floor that can make you trip. What can I do with my stairs? Do not leave any items on the stairs. Make sure that there are handrails on both sides of the stairs and use them. Fix handrails that are broken or loose. Make sure that handrails are as long as the stairways. Check any carpeting to make sure that it is firmly attached to the stairs. Fix any carpet that is loose or worn. Avoid having throw rugs at the top or bottom of the  stairs. If you do have throw rugs, attach them to the floor with carpet tape. Make sure that you have a light switch at the top of the stairs and the bottom of the stairs. If you do not have them, ask someone to add them for you. What else can I do to help prevent falls? Wear shoes that: Do not have high heels. Have rubber bottoms. Are comfortable and fit you well. Are closed at the toe. Do not wear sandals. If you use a stepladder: Make sure that it is fully opened. Do not climb a closed stepladder. Make sure that both sides of the stepladder are locked into place. Ask someone to hold it for you, if possible. Clearly mark and make sure that you can see: Any grab bars or handrails. First and last  steps. Where the edge of each step is. Use tools that help you move around (mobility aids) if they are needed. These include: Canes. Walkers. Scooters. Crutches. Turn on the lights when you go into a dark area. Replace any light bulbs as soon as they burn out. Set up your furniture so you have a clear path. Avoid moving your furniture around. If any of your floors are uneven, fix them. If there are any pets around you, be aware of where they are. Review your medicines with your doctor. Some medicines can make you feel dizzy. This can increase your chance of falling. Ask your doctor what other things that you can do to help prevent falls. This information is not intended to replace advice given to you by your health care provider. Make sure you discuss any questions you have with your health care provider. Document Released: 10/23/2008 Document Revised: 06/04/2015 Document Reviewed: 01/31/2014 Elsevier Interactive Patient Education  2017 [employer].

## 2021-01-21 ENCOUNTER — Other Ambulatory Visit: Payer: Self-pay | Admitting: Family Medicine

## 2021-01-21 DIAGNOSIS — J309 Allergic rhinitis, unspecified: Secondary | ICD-10-CM

## 2021-01-27 ENCOUNTER — Encounter: Payer: Self-pay | Admitting: Adult Health

## 2021-01-27 ENCOUNTER — Ambulatory Visit (INDEPENDENT_AMBULATORY_CARE_PROVIDER_SITE_OTHER): Payer: Medicare Other | Admitting: Adult Health

## 2021-01-27 VITALS — BP 155/82 | HR 63 | Ht 74.0 in | Wt 220.2 lb

## 2021-01-27 DIAGNOSIS — G4763 Sleep related bruxism: Secondary | ICD-10-CM

## 2021-01-27 DIAGNOSIS — R202 Paresthesia of skin: Secondary | ICD-10-CM | POA: Diagnosis not present

## 2021-01-27 NOTE — Patient Instructions (Signed)
Your Plan:  Continue Carbamazepine 200 mg daily If your symptoms worsen or you develop new symptoms please let us know.    Thank you for coming to see [place] at [facility]. I hope we have been able to provide you high quality care today.  You may receive a patient satisfaction survey over the next few weeks. We would appreciate your feedback and comments so that we may continue to improve ourselves and the health of our patients.
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
## 2021-02-03 ENCOUNTER — Other Ambulatory Visit: Payer: Self-pay | Admitting: Family Medicine

## 2021-02-04 ENCOUNTER — Encounter: Payer: Self-pay | Admitting: Family Medicine

## 2021-02-04 ENCOUNTER — Ambulatory Visit (INDEPENDENT_AMBULATORY_CARE_PROVIDER_SITE_OTHER): Payer: Medicare Other | Admitting: Family Medicine

## 2021-02-04 ENCOUNTER — Other Ambulatory Visit: Payer: Self-pay | Admitting: Endocrinology

## 2021-02-04 VITALS — BP 130/80 | HR 58 | Temp 97.8°F | Resp 18 | Ht 74.0 in

## 2021-02-04 DIAGNOSIS — I10 Essential (primary) hypertension: Secondary | ICD-10-CM | POA: Diagnosis not present

## 2021-02-04 DIAGNOSIS — E785 Hyperlipidemia, unspecified: Secondary | ICD-10-CM

## 2021-02-04 DIAGNOSIS — K219 Gastro-esophageal reflux disease without esophagitis: Secondary | ICD-10-CM

## 2021-02-04 DIAGNOSIS — E119 Type 2 diabetes mellitus without complications: Secondary | ICD-10-CM

## 2021-02-04 LAB — LIPID PANEL
Cholesterol: 172 mg/dL (ref 0–200)
HDL: 50.1 mg/dL (ref >39.00)
LDL Cholesterol: 103 mg/dL — ABNORMAL HIGH (ref 0–99)
NonHDL: 121.53
Total CHOL/HDL Ratio: 3
Triglycerides: 94 mg/dL (ref 0.0–149.0)
VLDL: 18.8 mg/dL (ref 0.0–40.0)

## 2021-02-04 MED ORDER — CLONIDINE 0.3 MG/24HR TD PTWK: 0.3000 mg | MEDICATED_PATCH | TRANSDERMAL | 3 refills | Status: DC

## 2021-02-04 NOTE — Patient Instructions (Addendum)
It was good to see you again today  Continue clonidine patch Try going without pantoprazole- however, if you start having Gerd symptoms again we can try appealing the denial, or try omeprazole OTC- or a different rx.  Please let me know if you need my help   Assuming all is well please see me in about 6 months

## 2021-02-22 ENCOUNTER — Other Ambulatory Visit: Payer: Self-pay | Admitting: Family Medicine

## 2021-02-22 DIAGNOSIS — G459 Transient cerebral ischemic attack, unspecified: Secondary | ICD-10-CM

## 2021-03-05 ENCOUNTER — Ambulatory Visit (INDEPENDENT_AMBULATORY_CARE_PROVIDER_SITE_OTHER): Payer: Medicare Other | Admitting: Endocrinology

## 2021-03-05 ENCOUNTER — Telehealth: Payer: Self-pay | Admitting: Family Medicine

## 2021-03-05 ENCOUNTER — Telehealth: Payer: Self-pay

## 2021-03-05 ENCOUNTER — Other Ambulatory Visit: Payer: Self-pay

## 2021-03-05 VITALS — BP 128/70 | HR 70 | Ht 74.0 in | Wt 224.4 lb

## 2021-03-05 DIAGNOSIS — E1151 Type 2 diabetes mellitus with diabetic peripheral angiopathy without gangrene: Secondary | ICD-10-CM | POA: Diagnosis not present

## 2021-03-05 LAB — POCT GLYCOSYLATED HEMOGLOBIN (HGB A1C): Hemoglobin A1C: 6.1 % — AB (ref 4.0–5.6)

## 2021-03-05 LAB — T4, FREE: Free T4: 0.61 ng/dL (ref 0.60–1.60)

## 2021-03-05 LAB — TSH: TSH: 1.05 u[IU]/mL (ref 0.35–5.50)
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
## 2021-03-05 NOTE — Telephone Encounter (Signed)
Patient would like his lab report from 02/04/21 mailed to his house. Please advise.

## 2021-03-05 NOTE — Patient Instructions (Addendum)
Please continue the same 4 diabetes medications.   check your blood sugar once a day.  vary the time of day when you check, between before the 3 meals, and at bedtime.  also check if you have symptoms of your blood sugar being too high or too low.  please keep a record of the readings and bring it to your next appointment here (or you can bring the meter itself).  You can write it on any piece of paper.  please call us sooner if your blood sugar goes below 70, or if you have a lot of readings over 200.   Please come back for a follow-up appointment in 6 months.

## 2021-03-05 NOTE — Telephone Encounter (Signed)
Pt would like a call about lab results and send a copy to address

## 2021-03-08 NOTE — Telephone Encounter (Signed)
Results mailed 

## 2021-03-15 ENCOUNTER — Other Ambulatory Visit: Payer: Self-pay | Admitting: Family Medicine

## 2021-03-15 DIAGNOSIS — I10 Essential (primary) hypertension: Secondary | ICD-10-CM

## 2021-03-18 ENCOUNTER — Telehealth: Payer: Self-pay | Admitting: *Deleted

## 2021-03-18 NOTE — Telephone Encounter (Signed)
Pt called, need letter stating that he has been sick and not able to help his wife pay the bills. Please address to [employer] program. [phone] ?

## 2021-03-22 NOTE — Telephone Encounter (Signed)
I called pt back and I relayed that he came to [place] being disabled and was done thru PCP, Dr. Everlene Farrier. Jinny Blossom, NP stated to address with pcp.   He will call them and speak to them about this.   ?

## 2021-03-22 NOTE — Telephone Encounter (Signed)
I called pt.  He states that 2 yrs ago he upgraded his payment to [facility] to a higher level.  It is now difficult for his wife to maintain that payment level and they are asking if pt can go back to work.  This is the reason for his request for letter. He stated that he has been on disability (SSD) since Dr. Newman Pies.  (Whom he does not see now) but [place] for maintenance.  I relayed that any SSD issue, we send records only.  He states he cannot go back to work, he will be 71 yrs old next month.  Looking further into his chart, he came to [place] already disabled (Dr. Nena Jordan) (noted in 07-03-2013 Dr. Edwena Felty note).  I relayed that I would ask and get back with him.   ?

## 2021-03-23 ENCOUNTER — Encounter: Payer: Self-pay | Admitting: Family Medicine

## 2021-03-23 ENCOUNTER — Telehealth: Payer: Self-pay | Admitting: Family Medicine

## 2021-03-23 DIAGNOSIS — J0110 Acute frontal sinusitis, unspecified: Secondary | ICD-10-CM

## 2021-03-23 NOTE — Telephone Encounter (Signed)
Pt states that he & his wife are needing a letter stating something along the lines that, Wife takes care of patient due to being handicap.  ? ?Patients wife has gotten behind on time share & they are trying to get it set up with a payment plan. To help them out they would need a letter ? ?Please advise ? ?

## 2021-03-23 NOTE — Telephone Encounter (Signed)
Okay for note

## 2021-04-07 MED ORDER — AMOXICILLIN 500 MG PO CAPS: 1000.0000 mg | ORAL_CAPSULE | Freq: Two times a day (BID) | ORAL | 0 refills | Status: DC

## 2021-04-07 NOTE — Telephone Encounter (Signed)
Called and discussed with patient and his wife ?They got a time share in 2021 ?The monthly cost of the timeshare went up; they would like documentation that Lorenza is disabled and not able to return to work, and that they have significant medical costs in hopes that the cost may be reduced. ?I can certainly provide this information for them ? ? ?Also, Adrienne notes that his nasal congestion has gotten worse recently.  He notes his nasal discharge does not smell very good and he has some pressure in sinuses.  I will call in a course of amoxicillin for him.  He will let me know if this is not helpful ? ?[place]-please print out letter and email to ?They would like letter emailed to [email or web address] ? ?Thank you!!   ?

## 2021-04-07 NOTE — Telephone Encounter (Signed)
Pt states his cell phone is going berserk and will not allow him to access my chart.  ? ?Pt would like to discuss information with [place] regarding payment plan.  ? ?Please advise  ?

## 2021-04-14 ENCOUNTER — Other Ambulatory Visit: Payer: Self-pay

## 2021-04-14 DIAGNOSIS — E119 Type 2 diabetes mellitus without complications: Secondary | ICD-10-CM

## 2021-04-14 MED ORDER — PIOGLITAZONE HCL 15 MG PO TABS: 15.0000 mg | ORAL_TABLET | Freq: Every day | ORAL | 3 refills | Status: DC

## 2021-04-22 ENCOUNTER — Other Ambulatory Visit: Payer: Self-pay | Admitting: Family Medicine

## 2021-04-22 DIAGNOSIS — E785 Hyperlipidemia, unspecified: Secondary | ICD-10-CM

## 2021-04-29 ENCOUNTER — Other Ambulatory Visit: Payer: Self-pay

## 2021-04-29 MED ORDER — SITAGLIPTIN PHOSPHATE 100 MG PO TABS: 50.0000 mg | ORAL_TABLET | Freq: Every day | ORAL | 3 refills | Status: DC

## 2021-05-28 ENCOUNTER — Encounter (HOSPITAL_BASED_OUTPATIENT_CLINIC_OR_DEPARTMENT_OTHER): Payer: Self-pay | Admitting: Emergency Medicine

## 2021-05-28 ENCOUNTER — Emergency Department (HOSPITAL_BASED_OUTPATIENT_CLINIC_OR_DEPARTMENT_OTHER)
Admission: EM | Admit: 2021-05-28 | Discharge: 2021-05-28 | Disposition: A | Discharge status: Home / Self Care | Payer: Medicare Other | Attending: Emergency Medicine | Admitting: Emergency Medicine

## 2021-05-28 ENCOUNTER — Other Ambulatory Visit: Payer: Self-pay

## 2021-05-28 DIAGNOSIS — Z91040 Latex allergy status: Secondary | ICD-10-CM | POA: Insufficient documentation

## 2021-05-28 DIAGNOSIS — R509 Fever, unspecified: Secondary | ICD-10-CM | POA: Diagnosis not present

## 2021-05-28 DIAGNOSIS — R197 Diarrhea, unspecified: Secondary | ICD-10-CM

## 2021-05-28 DIAGNOSIS — R1110 Vomiting, unspecified: Secondary | ICD-10-CM | POA: Diagnosis not present

## 2021-05-28 LAB — CBC WITH DIFFERENTIAL/PLATELET
Abs Immature Granulocytes: 0.03 10*3/uL (ref 0.00–0.07)
Basophils Absolute: 0 10*3/uL (ref 0.0–0.1)
Basophils Relative: 0 %
Eosinophils Absolute: 0 10*3/uL (ref 0.0–0.5)
Eosinophils Relative: 0 %
HCT: 41.7 % (ref 39.0–52.0)
Hemoglobin: 13.8 g/dL (ref 13.0–17.0)
Immature Granulocytes: 0 %
Lymphocytes Relative: 3 %
Lymphs Abs: 0.3 10*3/uL — ABNORMAL LOW (ref 0.7–4.0)
MCH: 28.7 pg (ref 26.0–34.0)
MCHC: 33.1 g/dL (ref 30.0–36.0)
MCV: 86.7 fL (ref 80.0–100.0)
Monocytes Absolute: 0.8 10*3/uL (ref 0.1–1.0)
Monocytes Relative: 9 %
Neutro Abs: 7.7 10*3/uL (ref 1.7–7.7)
Neutrophils Relative %: 88 %
Platelets: 122 10*3/uL — ABNORMAL LOW (ref 150–400)
RBC: 4.81 MIL/uL (ref 4.22–5.81)
RDW: 12.6 % (ref 11.5–15.5)
WBC: 8.8 10*3/uL (ref 4.0–10.5)
nRBC: 0 % (ref 0.0–0.2)

## 2021-05-28 LAB — COMPREHENSIVE METABOLIC PANEL
ALT: 14 U/L (ref 0–44)
AST: 18 U/L (ref 15–41)
Albumin: 4 g/dL (ref 3.5–5.0)
Alkaline Phosphatase: 83 U/L (ref 38–126)
Anion gap: 8 (ref 5–15)
BUN: 16 mg/dL (ref 8–23)
CO2: 21 mmol/L — ABNORMAL LOW (ref 22–32)
Calcium: 8.6 mg/dL — ABNORMAL LOW (ref 8.9–10.3)
Chloride: 110 mmol/L (ref 98–111)
Creatinine, Ser: 0.98 mg/dL (ref 0.61–1.24)
GFR, Estimated: >60 mL/min (ref >60)
Glucose, Bld: 148 mg/dL — ABNORMAL HIGH (ref 70–99)
Potassium: 3.3 mmol/L — ABNORMAL LOW (ref 3.5–5.1)
Sodium: 139 mmol/L (ref 135–145)
Total Bilirubin: 0.8 mg/dL (ref 0.3–1.2)
Total Protein: 7.5 g/dL (ref 6.5–8.1)

## 2021-05-28 LAB — URINALYSIS, ROUTINE W REFLEX MICROSCOPIC
Bilirubin Urine: NEGATIVE
Glucose, UA: >=500 mg/dL — AB
Hgb urine dipstick: NEGATIVE
Ketones, ur: 15 mg/dL — AB
Leukocytes,Ua: NEGATIVE
Nitrite: NEGATIVE
Protein, ur: NEGATIVE mg/dL
Specific Gravity, Urine: 1.02 (ref 1.005–1.030)
pH: 5 (ref 5.0–8.0)

## 2021-05-28 LAB — URINALYSIS, MICROSCOPIC (REFLEX)

## 2021-05-28 LAB — LIPASE, BLOOD: Lipase: 24 U/L (ref 11–51)

## 2021-05-28 LAB — LACTIC ACID, PLASMA: Lactic Acid, Venous: 1.3 mmol/L (ref 0.5–1.9)

## 2021-05-28 MED ORDER — ONDANSETRON 4 MG PO TBDP
4.0000 mg | ORAL_TABLET | Freq: Once | ORAL | Status: AC
  Administered 2021-05-28: 4 mg via ORAL
  Filled 2021-05-28: qty 1

## 2021-05-28 MED ORDER — SODIUM CHLORIDE 0.9 % IV BOLUS: 1000.0000 mL | Freq: Once | INTRAVENOUS | Status: DC

## 2021-05-28 MED ORDER — ACETAMINOPHEN 500 MG PO TABS
1000.0000 mg | ORAL_TABLET | Freq: Once | ORAL | Status: AC
  Administered 2021-05-28: 1000 mg via ORAL
  Filled 2021-05-28: qty 2

## 2021-05-28 MED ORDER — MORPHINE SULFATE (PF) 2 MG/ML IV SOLN
2.0000 mg | Freq: Once | INTRAVENOUS | Status: AC
  Administered 2021-05-28: 2 mg via INTRAVENOUS
  Filled 2021-05-28: qty 1

## 2021-05-28 MED ORDER — ONDANSETRON 4 MG PO TBDP: ORAL_TABLET | ORAL | 0 refills | Status: DC

## 2021-05-28 MED ORDER — AZITHROMYCIN 250 MG PO TABS: 500.0000 mg | ORAL_TABLET | Freq: Every day | ORAL | 0 refills | Status: AC

## 2021-05-28 MED ORDER — AZITHROMYCIN 250 MG PO TABS
500.0000 mg | ORAL_TABLET | Freq: Every day | ORAL | Status: DC
  Administered 2021-05-28: 500 mg via ORAL
  Filled 2021-05-28: qty 2

## 2021-05-28 NOTE — ED Provider Notes (Signed)
[facility] EMERGENCY DEPARTMENT Provider Note   CSN: [id number] Arrival date & time: 05/28/21  1016     History  Chief Complaint  Patient presents with   Diarrhea    Andrew [name] is a 70 y.o. male.  71 yo M with a chief complaints of diarrhea and fever.  The patient ate at a red lobster last night and had scallops.  He thought that they tasted funny and then this morning had developed some diarrhea.  Had about 6 episodes 1 episode of emesis.  He described the stool initially is dark and now it is more yellowish in color.  Denies any abdominal pain.  Was having some chills but feels little bit better.   The history is provided by the patient.  Diarrhea     Home Medications Prior to Admission medications   Medication Sig Start Date End Date Taking? Authorizing Provider  azithromycin (ZITHROMAX) 250 MG tablet Take 2 tablets (500 mg total) by mouth daily for 2 days. Take first 2 tablets together, then 1 every day until finished. 05/28/21 05/30/21 Yes Deno Etienne, DO  ondansetron (ZOFRAN-ODT) 4 MG disintegrating tablet '4mg'$  ODT q4 hours prn nausea/vomit 05/28/21  Yes Tyrone Nine, Kamal Jurgens, DO  amLODipine (NORVASC) 10 MG tablet TAKE 1 TABLET(10 MG) BY MOUTH DAILY 09/15/20   Fay Records, MD  amoxicillin (AMOXIL) 500 MG capsule Take 2 capsules (1,000 mg total) by mouth 2 (two) times daily. 04/07/21   Copland, Gay Filler, MD  atorvastatin (LIPITOR) 20 MG tablet TAKE 1 TABLET(20 MG) BY MOUTH DAILY 04/23/21   Copland, Gay Filler, MD  Blood Pressure Monitoring (BLOOD PRESSURE MONITOR/L CUFF) MISC To monitor blood pressure daily/ has elevated blood pressure readings on medication for hypertension 11/05/13   [name], MD  carbamazepine (TEGRETOL) 200 MG tablet TAKE 1 TABLET(200 MG) BY MOUTH DAILY 08/04/20   Dohmeier, Asencion Partridge, MD  carvedilol (COREG) 25 MG tablet TAKE 1 TABLET(25 MG) BY MOUTH TWICE DAILY WITH A MEAL 01/01/20   Copland, Gay Filler, MD  cetirizine (ZYRTEC) 10 MG tablet Take 10 mg  by mouth daily.    [provider]  cloNIDine (CATAPRES - DOSED IN MG/24 HR) 0.3 mg/24hr patch Place 1 patch (0.3 mg total) onto the skin once a week. 02/04/21   Copland, Gay Filler, MD  clopidogrel (PLAVIX) 75 MG tablet TAKE 1 TABLET(75 MG) BY MOUTH DAILY 02/22/21   Copland, Gay Filler, MD  dorzolamide-timolol (COSOPT) 22.3-6.8 MG/ML ophthalmic solution 1 drop daily. Left eye 04/08/13   [provider]  FARXIGA 5 MG TABS tablet TAKE 1 TABLET BY MOUTH DAILY BEFORE AND BREAKFAST 02/04/21   Renato Shin, MD  fluticasone (FLONASE) 50 MCG/ACT nasal spray SHAKE LIQUID AND USE 2 SPRAYS IN EACH NOSTRIL DAILY 01/21/21   Copland, Gay Filler, MD  glucose blood (BAYER CONTOUR TEST) test strip 1 each by Other route daily. 10/19/15   Renato Shin, MD  latanoprost (XALATAN) 0.005 % ophthalmic solution INT 1 GTT IN OU HS 04/24/17   [provider]  metFORMIN (GLUCOPHAGE-XR) 500 MG 24 hr tablet TAKE 1 TABLET(500 MG) BY MOUTH DAILY 09/14/20   Renato Shin, MD  mupirocin ointment Drue Stager) 2 % Apply thin film once daily 03/31/16   Saguier, Percell Miller, PA-C  naproxen sodium (ANAPROX) 220 MG tablet Take 220 mg by mouth as needed.    [provider]  olmesartan (BENICAR) 40 MG tablet TAKE 1 TABLET(40 MG) BY MOUTH DAILY 03/15/21   Copland, Gay Filler, MD  pantoprazole ([place])  40 MG tablet TAKE 1 TABLET BY MOUTH EVERY DAY FOR GERD 02/03/21   Copland, Gay Filler, MD  pioglitazone (ACTOS) 15 MG tablet Take 1 tablet (15 mg total) by mouth daily. 04/14/21   Renato Shin, MD  Saline (ARY NASAL MIST ALLERGY/SINUS NA) Place into the nose as needed.    [provider]  sitaGLIPtin (JANUVIA) 100 MG tablet Take 0.5 tablets (50 mg total) by mouth daily. 04/29/21   Renato Shin, MD      Allergies    Adhesive [tape], Latex, Aspirin, Ether, Hydrocodone, Lexapro [escitalopram oxalate], and Other    Review of Systems   Review of Systems  Gastrointestinal:  Positive for diarrhea.   Physical  Exam Updated Vital Signs BP 136/66   Pulse 83   Temp 98.8 F (37.1 C) (Oral)   Resp 18   Ht '6\' 2"'$  (1.88 m)   Wt 99.8 kg   SpO2 98%   BMI 28.25 kg/m  Physical Exam Vitals and nursing note reviewed.  Constitutional:      Appearance: He is well-developed.  HENT:     Head: Normocephalic and atraumatic.  Eyes:     Pupils: Pupils are equal, round, and reactive to light.  Neck:     Vascular: No JVD.  Cardiovascular:     Rate and Rhythm: Normal rate and regular rhythm.     Heart sounds: No murmur heard.   No friction rub. No gallop.  Pulmonary:     Effort: No respiratory distress.     Breath sounds: No wheezing.  Abdominal:     General: There is no distension.     Tenderness: There is no abdominal tenderness. There is no guarding or rebound.  Musculoskeletal:        General: Normal range of motion.     Cervical back: Normal range of motion and neck supple.  Skin:    Coloration: Skin is not pale.     Findings: No rash.  Neurological:     Mental Status: He is alert and oriented to person, place, and time.  Psychiatric:        Behavior: Behavior normal.    ED Results / Procedures / Treatments   Labs (all labs ordered are listed, but only abnormal results are displayed) Labs Reviewed  CBC WITH DIFFERENTIAL/PLATELET - Abnormal; Notable for the following components:      Result Value   Platelets 122 (*)    Lymphs Abs 0.3 (*)    All other components within normal limits  COMPREHENSIVE METABOLIC PANEL - Abnormal; Notable for the following components:   Potassium 3.3 (*)    CO2 21 (*)    Glucose, Bld 148 (*)    Calcium 8.6 (*)    All other components within normal limits  URINALYSIS, ROUTINE W REFLEX MICROSCOPIC - Abnormal; Notable for the following components:   Glucose, UA >=500 (*)    Ketones, ur 15 (*)    All other components within normal limits  URINALYSIS, MICROSCOPIC (REFLEX) - Abnormal; Notable for the following components:   Bacteria, UA RARE (*)    All other  components within normal limits  LIPASE, BLOOD  LACTIC ACID, PLASMA  LACTIC ACID, PLASMA    EKG None  Radiology No results found.  Procedures Procedures    Medications Ordered in ED Medications  azithromycin (ZITHROMAX) tablet 500 mg (500 mg Oral Given 05/28/21 1215)  sodium chloride 0.9 % bolus 1,000 mL (1,000 mLs Intravenous New Bag/Given 05/28/21 1114)  morphine (PF) 2 MG/ML  injection 2 mg (2 mg Intravenous Given 05/28/21 1112)  ondansetron (ZOFRAN-ODT) disintegrating tablet 4 mg (4 mg Oral Given 05/28/21 1112)  acetaminophen (TYLENOL) tablet 1,000 mg (1,000 mg Oral Given 05/28/21 1112)    ED Course/ Medical Decision Making/ A&P                           Medical Decision Making Amount and/or Complexity of Data Reviewed Labs: ordered.  Risk OTC drugs. Prescription drug management.   71 yo M with a chief complaints of diarrhea and fever.  The patient ate at a red lobster last night and had scallops.  He thought that they tasted funny and then this morning had developed some diarrhea.  Had about 6 episodes 1 episode of emesis.  He described the stool initially is dark and now it is more yellowish in color.  Denies any abdominal pain.  Was having some chills but feels little bit better.  Was febrile to 102 on arrival here.  We will give a bolus of IV fluids treat nausea.  Laboratory evaluation.  Will reassess.  Patient continues to feel well.  No anemia no significant electrolyte abnormality UA negative for infection is independently interpreted by me.  LFTs and lipase are unremarkable.  I discussed the results with the patient.  There is some concern for a bacterial infectious diarrhea based on his history.  I will start on oral antibiotics.  Have him continue Imodium and given a prescription for Zofran for home.  We will have him follow-up with his family doctor.  12:30 PM:  I have discussed the diagnosis/risks/treatment options with the patient.  Evaluation and diagnostic  testing in the emergency department does not suggest an emergent condition requiring admission or immediate intervention beyond what has been performed at this time.  They will follow up with  PCP. We also discussed returning to the ED immediately if new or worsening sx occur. We discussed the sx which are most concerning (e.g., sudden worsening pain, fever, inability to tolerate by mouth) that necessitate immediate return. Medications administered to the patient during their visit and any new prescriptions provided to the patient are listed below.  Medications given during this visit Medications  azithromycin (ZITHROMAX) tablet 500 mg (500 mg Oral Given 05/28/21 1215)  sodium chloride 0.9 % bolus 1,000 mL (1,000 mLs Intravenous New Bag/Given 05/28/21 1114)  morphine (PF) 2 MG/ML injection 2 mg (2 mg Intravenous Given 05/28/21 1112)  ondansetron (ZOFRAN-ODT) disintegrating tablet 4 mg (4 mg Oral Given 05/28/21 1112)  acetaminophen (TYLENOL) tablet 1,000 mg (1,000 mg Oral Given 05/28/21 1112)     The patient appears reasonably screen and/or stabilized for discharge and I doubt any other medical condition or other [facility] requiring further screening, evaluation, or treatment in the ED at this time prior to discharge.          Final Clinical Impression(s) / ED Diagnoses Final diagnoses:  Diarrhea in adult patient    Rx / DC Orders ED Discharge Orders          Ordered    azithromycin (ZITHROMAX) 250 MG tablet  Daily        05/28/21 1151    ondansetron (ZOFRAN-ODT) 4 MG disintegrating tablet        05/28/21 Ventress, Emilly Lavey, DO 05/28/21 1230

## 2021-05-28 NOTE — ED Notes (Signed)
ED Provider at bedside. 

## 2021-05-28 NOTE — Discharge Instructions (Signed)
Please return for abdominal pain inability to eat or drink.  Please follow-up with your family doctor in the office.

## 2021-05-28 NOTE — ED Triage Notes (Signed)
Ate at red lobster and had scallops and woke up this am and had diarrhea and a chill

## 2021-07-15 ENCOUNTER — Other Ambulatory Visit: Payer: Self-pay | Admitting: Family Medicine

## 2021-07-15 DIAGNOSIS — J309 Allergic rhinitis, unspecified: Secondary | ICD-10-CM

## 2021-07-17 DIAGNOSIS — Z23 Encounter for immunization: Secondary | ICD-10-CM | POA: Diagnosis not present

## 2021-07-19 ENCOUNTER — Other Ambulatory Visit: Payer: Self-pay | Admitting: Neurology

## 2021-07-29 NOTE — Patient Instructions (Addendum)
It was very nice to see you again today, assuming all is well please see me in about 6 months I will watch for your CT coronary as well
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
## 2021-08-04 ENCOUNTER — Ambulatory Visit (INDEPENDENT_AMBULATORY_CARE_PROVIDER_SITE_OTHER): Payer: Medicare Other | Admitting: Family Medicine

## 2021-08-04 ENCOUNTER — Ambulatory Visit (HOSPITAL_BASED_OUTPATIENT_CLINIC_OR_DEPARTMENT_OTHER)
Admission: RE | Admit: 2021-08-04 | Discharge: 2021-08-04 | Disposition: A | Discharge status: Home / Self Care | Payer: Medicare Other | Source: Ambulatory Visit | Attending: Family Medicine | Admitting: Family Medicine

## 2021-08-04 VITALS — BP 122/70 | HR 69 | Temp 97.9°F | Resp 18 | Ht 74.0 in | Wt 223.8 lb

## 2021-08-04 DIAGNOSIS — E785 Hyperlipidemia, unspecified: Secondary | ICD-10-CM | POA: Diagnosis not present

## 2021-08-04 DIAGNOSIS — I10 Essential (primary) hypertension: Secondary | ICD-10-CM | POA: Diagnosis not present

## 2021-08-04 DIAGNOSIS — E119 Type 2 diabetes mellitus without complications: Secondary | ICD-10-CM | POA: Insufficient documentation

## 2021-08-04 DIAGNOSIS — K219 Gastro-esophageal reflux disease without esophagitis: Secondary | ICD-10-CM

## 2021-08-04 LAB — HEMOGLOBIN A1C: Hgb A1c MFr Bld: 6.1 % (ref 4.6–6.5)

## 2021-08-04 LAB — BASIC METABOLIC PANEL
BUN: 19 mg/dL (ref 6–23)
CO2: 27 mEq/L (ref 19–32)
Calcium: 9 mg/dL (ref 8.4–10.5)
Chloride: 107 mEq/L (ref 96–112)
Creatinine, Ser: 1.06 mg/dL (ref 0.40–1.50)
GFR: 71.21 mL/min (ref >60.00)
Glucose, Bld: 99 mg/dL (ref 70–99)
Potassium: 4.1 mEq/L (ref 3.5–5.1)
Sodium: 140 mEq/L (ref 135–145)

## 2021-08-04 LAB — CBC
HCT: 39.7 % (ref 39.0–52.0)
Hemoglobin: 12.9 g/dL — ABNORMAL LOW (ref 13.0–17.0)
MCHC: 32.4 g/dL (ref 30.0–36.0)
MCV: 87.5 fl (ref 78.0–100.0)
Platelets: 156 10*3/uL (ref 150.0–400.0)
RBC: 4.54 Mil/uL (ref 4.22–5.81)
RDW: 13.5 % (ref 11.5–15.5)
WBC: 4.2 10*3/uL (ref 4.0–10.5)

## 2021-08-04 MED ORDER — PANTOPRAZOLE SODIUM 40 MG PO TBEC: DELAYED_RELEASE_TABLET | ORAL | 3 refills | Status: DC

## 2021-08-09 ENCOUNTER — Telehealth: Payer: Self-pay | Admitting: Family Medicine

## 2021-08-09 NOTE — Telephone Encounter (Signed)
Patient called to follow up on his prescription pantoprazole (PROTONIX) 40 MG tablet [[id number]]. Advised patient prescription was sent to [facility] on [facility] [address] [facility]. Patient advised he would go to the pharmacy and follow up.

## 2021-08-16 ENCOUNTER — Telehealth: Payer: Self-pay | Admitting: Family Medicine

## 2021-08-16 NOTE — Telephone Encounter (Signed)
Called pt but had to [facility].  He unfortunately did not do well on his coronary CT.  I will get in touch with Dr Harrington Challenger about his results and ask her to bring him in for a recheck   JC

## 2021-08-17 ENCOUNTER — Telehealth: Payer: Self-pay

## 2021-08-17 NOTE — Telephone Encounter (Signed)
-----   Message from Fay Records, MD sent at 08/16/2021 12:52 PM EDT ----- Please set patient up to see me in clnic  ----- Message ----- From: Darreld Mclean, MD Sent: 08/16/2021   9:25 AM EDT To: Fay Records, MD  Hi Nevin Bloodgood- hope you guys are well!  I got a coronary calcium for Andrew [name]- I'm afraid he did not do great  IMPRESSION: Coronary calcium score of 687. This was 92nd percentile for age-, race-, and sex-matched controls.  Could you ask your team to bring in him for a follow-up visit?  I was not sure if he might benefit from a change in his lipid management or if anything else might be indicated.  Thank you!  Jess

## 2021-08-17 NOTE — Telephone Encounter (Signed)
Appt made with Dr Harrington Challenger 09/01/21.

## 2021-08-17 NOTE — Telephone Encounter (Signed)
Left a message for the pt to call back.... he can see Dr Harrington Challenger 09/15/21 or can add him on to her quarter day 09/01/21.

## 2021-08-30 DIAGNOSIS — N5201 Erectile dysfunction due to arterial insufficiency: Secondary | ICD-10-CM | POA: Diagnosis not present

## 2021-08-30 DIAGNOSIS — R350 Frequency of micturition: Secondary | ICD-10-CM | POA: Diagnosis not present
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
## 2021-09-01 ENCOUNTER — Encounter: Payer: Self-pay | Admitting: Internal Medicine

## 2021-09-01 ENCOUNTER — Ambulatory Visit (INDEPENDENT_AMBULATORY_CARE_PROVIDER_SITE_OTHER): Payer: Medicare Other | Admitting: Internal Medicine

## 2021-09-01 VITALS — BP 138/80 | HR 65 | Ht 74.0 in | Wt 224.0 lb

## 2021-09-01 DIAGNOSIS — E785 Hyperlipidemia, unspecified: Secondary | ICD-10-CM

## 2021-09-01 DIAGNOSIS — G459 Transient cerebral ischemic attack, unspecified: Secondary | ICD-10-CM

## 2021-09-01 DIAGNOSIS — I10 Essential (primary) hypertension: Secondary | ICD-10-CM

## 2021-09-01 MED ORDER — ROSUVASTATIN CALCIUM 40 MG PO TABS: 40.0000 mg | ORAL_TABLET | Freq: Every day | ORAL | 3 refills | Status: DC

## 2021-09-01 NOTE — Patient Instructions (Addendum)
Medication Instructions:   Change Lipitor to Crestor 40 mg daily   *If you need a refill on your cardiac medications before your next appointment, please call your pharmacy*   Lab Work: NMR in 8 weeks (Fasting) 10/27/2021 walk in anytime after 7:30 am   If you have labs (blood work) drawn today and your tests are completely normal, you will receive your results only by: [place] (if you have MyChart) OR A paper copy in the mail If you have any lab test that is abnormal or we need to change your treatment, we will call you to review the results.   Testing/Procedures:  How to Prepare for Your Cardiac PET/CT Stress Test:  1. Please do not take these medications before your test:   Medications that may interfere with the cardiac pharmacological stress agent (ex. nitrates - including erectile dysfunction medications or beta-blockers) the day of the exam. (Erectile dysfunction medication should be held for at least 72 hrs prior to test) Your remaining medications may be taken with water.  2. Nothing to eat or drink, except water, 3 hours prior to arrival time.   NO caffeine/decaffeinated products, or chocolate 12 hours prior to arrival.  3. NO perfume, cologne or lotion  4. Total time is 1 to 2 hours; you may want to bring reading material for the waiting time.  5. Please report to Admitting at the [facility] Main Entrance 60 minutes early for your test.  [place], [place] [address]  Diabetic Preparation:  Hold oral medications. Check blood sugars prior to leaving the house. If able to eat breakfast prior to 3 hour fasting, you may take all medications.   In preparation for your appointment, medication and supplies will be purchased.  Appointment availability is limited, so if you need to cancel or reschedule, please call the Radiology Department at [phone]  24 hours in advance to avoid a cancellation fee of $100.00  What to Expect After  you Arrive:  Once you arrive and check in for your appointment, you will be taken to a preparation room within the Radiology Department.  A technologist or Nurse will obtain your medical history, verify that you are correctly prepped for the exam, and explain the procedure.  Afterwards,  an IV will be started in your arm and electrodes will be placed on your skin for EKG monitoring during the stress portion of the exam. Then you will be escorted to the PET/CT scanner.  There, staff will get you positioned on the scanner and obtain a blood pressure and EKG.  During the exam, you will continue to be connected to the EKG and blood pressure machines.  A small, safe amount of a radioactive tracer will be injected in your IV to obtain a series of pictures of your heart along with an injection of a stress agent.    After your Exam:  It is recommended that you eat a meal and drink a caffeinated beverage to counter act any effects of the stress agent.  Drink plenty of fluids for the remainder of the day and urinate frequently for the first couple of hours after the exam.  Your doctor will inform you of your test results within 7-10 business days.  For questions about your test or how to prepare for your test, please call: Marchia Bond, Cardiac Imaging Nurse Navigator  Gordy Clement, Cardiac Imaging Nurse Navigator Office: [phone]    Follow-Up: At [facility], you and your health needs  are our priority.  As part of our continuing mission to provide you with exceptional heart care, we have created designated Provider Care Teams.  These Care Teams include your primary Cardiologist (physician) and Advanced Practice Providers (APPs -  Physician Assistants and Nurse Practitioners) who all work together to provide you with the care you need, when you need it.  We recommend signing up for the patient portal called "MyChart".  Sign up information is provided on this After Visit Summary.  MyChart is used to  connect with patients for Virtual Visits (Telemedicine).  Patients are able to view lab/test results, encounter notes, upcoming appointments, etc.  Non-urgent messages can be sent to your provider as well.   To learn more about what you can do with MyChart, go to [email or web address].     Other Instructions   Important Information About Sugar

## 2021-09-05 ENCOUNTER — Other Ambulatory Visit: Payer: Self-pay | Admitting: Family Medicine

## 2021-09-05 DIAGNOSIS — I10 Essential (primary) hypertension: Secondary | ICD-10-CM

## 2021-09-06 ENCOUNTER — Other Ambulatory Visit: Payer: Self-pay | Admitting: *Deleted

## 2021-09-06 MED ORDER — AMLODIPINE BESYLATE 10 MG PO TABS: ORAL_TABLET | ORAL | 3 refills | Status: DC

## 2021-09-15 ENCOUNTER — Other Ambulatory Visit: Payer: Self-pay

## 2021-09-15 DIAGNOSIS — E1151 Type 2 diabetes mellitus with diabetic peripheral angiopathy without gangrene: Secondary | ICD-10-CM

## 2021-09-15 MED ORDER — METFORMIN HCL ER 500 MG PO TB24: ORAL_TABLET | ORAL | 3 refills | Status: DC

## 2021-09-30 ENCOUNTER — Other Ambulatory Visit: Payer: Self-pay | Admitting: Endocrinology

## 2021-09-30 ENCOUNTER — Other Ambulatory Visit (INDEPENDENT_AMBULATORY_CARE_PROVIDER_SITE_OTHER): Payer: Medicare Other

## 2021-09-30 DIAGNOSIS — E1151 Type 2 diabetes mellitus with diabetic peripheral angiopathy without gangrene: Secondary | ICD-10-CM

## 2021-09-30 LAB — BASIC METABOLIC PANEL
BUN: 19 mg/dL (ref 6–23)
CO2: 27 mEq/L (ref 19–32)
Calcium: 9.2 mg/dL (ref 8.4–10.5)
Chloride: 110 mEq/L (ref 96–112)
Creatinine, Ser: 1.08 mg/dL (ref 0.40–1.50)
GFR: 69.56 mL/min (ref >60.00)
Glucose, Bld: 109 mg/dL — ABNORMAL HIGH (ref 70–99)
Potassium: 4.4 mEq/L (ref 3.5–5.1)
Sodium: 144 mEq/L (ref 135–145)

## 2021-09-30 LAB — MICROALBUMIN / CREATININE URINE RATIO
Creatinine,U: 99.7 mg/dL
Microalb Creat Ratio: 1.1 mg/g (ref 0.0–30.0)
Microalb, Ur: 1.1 mg/dL (ref 0.0–1.9)

## 2021-10-01 LAB — FRUCTOSAMINE: Fructosamine: 303 umol/L — ABNORMAL HIGH (ref 0–285)

## 2021-10-05 ENCOUNTER — Encounter: Payer: Self-pay | Admitting: Endocrinology

## 2021-10-05 ENCOUNTER — Ambulatory Visit (INDEPENDENT_AMBULATORY_CARE_PROVIDER_SITE_OTHER): Payer: Medicare Other | Admitting: Endocrinology

## 2021-10-05 VITALS — BP 136/76 | HR 59 | Ht 74.0 in | Wt 222.4 lb

## 2021-10-05 DIAGNOSIS — E669 Obesity, unspecified: Secondary | ICD-10-CM | POA: Diagnosis not present

## 2021-10-05 DIAGNOSIS — I10 Essential (primary) hypertension: Secondary | ICD-10-CM

## 2021-10-05 DIAGNOSIS — E1169 Type 2 diabetes mellitus with other specified complication: Secondary | ICD-10-CM
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
## 2021-10-05 NOTE — Patient Instructions (Addendum)
Check blood sugars on waking up 1 days a week  Call for name of meter  Also check blood sugars about 2 hours after meals and do this after different meals by rotation  Recommended blood sugar levels on waking up are 90-130 and about 2 hours after meal is 130-160  Please bring your blood sugar monitor to each visit, thank you  Stop Metformin  Farxiga 2 of '5mg'$  daily and then call for '10mg'$  Rx

## 2021-10-07 ENCOUNTER — Telehealth: Payer: Self-pay | Admitting: Family Medicine

## 2021-10-07 NOTE — Telephone Encounter (Signed)
Please advise 

## 2021-10-07 NOTE — Telephone Encounter (Signed)
Pt stated he had his last covid booster 7/8 and would like to know if pcp recommends getting the new booster that came out. He stated he does not use mychart and will need to be called.

## 2021-10-08 NOTE — Telephone Encounter (Signed)
Left message

## 2021-10-09 ENCOUNTER — Other Ambulatory Visit: Payer: Self-pay | Admitting: Family Medicine

## 2021-10-09 DIAGNOSIS — E119 Type 2 diabetes mellitus without complications: Secondary | ICD-10-CM

## 2021-10-27 ENCOUNTER — Ambulatory Visit: Payer: Medicare Other | Attending: Internal Medicine

## 2021-10-27 DIAGNOSIS — G459 Transient cerebral ischemic attack, unspecified: Secondary | ICD-10-CM | POA: Diagnosis not present

## 2021-10-27 DIAGNOSIS — E785 Hyperlipidemia, unspecified: Secondary | ICD-10-CM | POA: Diagnosis not present

## 2021-10-27 DIAGNOSIS — I10 Essential (primary) hypertension: Secondary | ICD-10-CM | POA: Diagnosis not present

## 2021-10-28 ENCOUNTER — Telehealth: Payer: Self-pay

## 2021-10-28 DIAGNOSIS — R0602 Shortness of breath: Secondary | ICD-10-CM

## 2021-10-28 LAB — NMR, LIPOPROFILE
Cholesterol, Total: 132 mg/dL (ref 100–199)
HDL Particle Number: 30 umol/L — ABNORMAL LOW (ref >=30.5)
HDL-C: 53 mg/dL (ref >39)
LDL Particle Number: 812 nmol/L (ref <1000)
LDL Size: 21.2 nm (ref >20.5)
LDL-C (NIH Calc): 64 mg/dL (ref 0–99)
LP-IR Score: <25 (ref <=45)
Small LDL Particle Number: 305 nmol/L (ref <=527)
Triglycerides: 76 mg/dL (ref 0–149)

## 2021-11-01 ENCOUNTER — Telehealth (HOSPITAL_COMMUNITY): Payer: Self-pay | Admitting: *Deleted

## 2021-11-01 NOTE — Telephone Encounter (Signed)
Attempted to call patient regarding upcoming cardiac PET appointment. Left message on voicemail with name and callback number  Gordy Clement RN Navigator Cardiac Imaging [facility] [phone] Office [phone] Cell  Reminded patient to avoid caffeine 12 hours prior to his cardiac PET scan.

## 2021-11-02 ENCOUNTER — Encounter (HOSPITAL_COMMUNITY)
Admission: RE | Admit: 2021-11-02 | Discharge: 2021-11-02 | Disposition: A | Discharge status: Home / Self Care | Payer: Medicare Other | Source: Ambulatory Visit | Attending: Internal Medicine | Admitting: Internal Medicine

## 2021-11-02 DIAGNOSIS — E785 Hyperlipidemia, unspecified: Secondary | ICD-10-CM

## 2021-11-02 DIAGNOSIS — I10 Essential (primary) hypertension: Secondary | ICD-10-CM | POA: Diagnosis not present

## 2021-11-02 DIAGNOSIS — G459 Transient cerebral ischemic attack, unspecified: Secondary | ICD-10-CM

## 2021-11-02 MED ORDER — RUBIDIUM RB82 GENERATOR (RUBYFILL)
26.1000 | PACK | Freq: Once | INTRAVENOUS | Status: AC
  Administered 2021-11-02: 26.1 via INTRAVENOUS

## 2021-11-02 MED ORDER — REGADENOSON 0.4 MG/5ML IV SOLN: 0.4000 mg | Freq: Once | INTRAVENOUS | Status: AC

## 2021-11-02 MED ORDER — REGADENOSON 0.4 MG/5ML IV SOLN
INTRAVENOUS | Status: AC
  Administered 2021-11-02: 0.4 mg via INTRAVENOUS
  Filled 2021-11-02: qty 5

## 2021-11-02 MED ORDER — RUBIDIUM RB82 GENERATOR (RUBYFILL)
26.2000 | PACK | Freq: Once | INTRAVENOUS | Status: AC
  Administered 2021-11-02: 26.2 via INTRAVENOUS

## 2021-11-03 LAB — NM PET CT CARDIAC PERFUSION MULTI W/ABSOLUTE BLOODFLOW
MBFR: 1.27
Nuc Rest EF: 44 %
Nuc Stress EF: 43 %
Peak HR: 81 {beats}/min
Rest HR: 54 {beats}/min
Rest MBF: 0.67 ml/g/min
Rest Nuclear Isotope Dose: 26.1 mCi
Rest perfusion cavity size (mL): 103 mL
ST Depression (mm): 0 mm
Stress MBF: 0.85 ml/g/min
Stress Nuclear Isotope Dose: 26.2 mCi
Stress perfusion cavity size (mL): 141 mL
TID: 1.36

## 2021-11-04 ENCOUNTER — Telehealth: Payer: Self-pay | Admitting: Internal Medicine

## 2021-11-04 NOTE — Telephone Encounter (Signed)
Patient states he was returning call. Please advise ?

## 2021-11-08 ENCOUNTER — Encounter: Payer: Self-pay | Admitting: Cardiovascular Disease

## 2021-11-08 ENCOUNTER — Ambulatory Visit: Payer: Medicare Other | Attending: Cardiovascular Disease | Admitting: Cardiovascular Disease

## 2021-11-08 ENCOUNTER — Telehealth: Payer: Self-pay

## 2021-11-08 ENCOUNTER — Ambulatory Visit: Payer: Medicare Other | Admitting: Nurse Practitioner

## 2021-11-08 VITALS — BP 112/62 | HR 60 | Ht 74.0 in | Wt 225.2 lb

## 2021-11-08 DIAGNOSIS — R0602 Shortness of breath: Secondary | ICD-10-CM | POA: Diagnosis not present

## 2021-11-08 DIAGNOSIS — Z01812 Encounter for preprocedural laboratory examination: Secondary | ICD-10-CM | POA: Diagnosis not present

## 2021-11-08 DIAGNOSIS — Z01810 Encounter for preprocedural cardiovascular examination: Secondary | ICD-10-CM

## 2021-11-08 DIAGNOSIS — I25119 Atherosclerotic heart disease of native coronary artery with unspecified angina pectoris: Secondary | ICD-10-CM | POA: Diagnosis not present
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
## 2021-11-08 NOTE — Telephone Encounter (Signed)
Pt to see the DOD today for updated H*P, labs and ECG for [facility] on 11/10/21 with Dr Ali Lowe.

## 2021-11-08 NOTE — Patient Instructions (Addendum)
Medication Instructions:  No changes *If you need a refill on your cardiac medications before your next appointment, please call your pharmacy*   Lab Work: Drawn today - pre procedure labs If you have labs (blood work) drawn today and your tests are completely normal, you will receive your results only by: [place] (if you have MyChart) OR A paper copy in the mail If you have any lab test that is abnormal or we need to change your treatment, we will call you to review the results.   Testing/Procedures: Cath as planned - instruction letter given today.    Follow-Up: As planned with Dr. Harrington Challenger   Important Information About [place]

## 2021-11-08 NOTE — Telephone Encounter (Signed)
-----   Message from Fay Records, MD sent at 11/04/2021  8:52 PM EDT ----- Reviewed with patient  Recomm L heart catheterization based on findings   Risk / benefits described    Pt agrees to proceed WIll  need CBC and BMET     Can it be done next week?   Tuesday?Call    Pt knows H Tamala Julian   Unfort he is not cathing next week   Dont want to delay
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
## 2021-11-08 NOTE — Telephone Encounter (Signed)
Pt is not seen in [facility] office.

## 2021-11-09 ENCOUNTER — Telehealth: Payer: Self-pay | Admitting: *Deleted

## 2021-11-09 LAB — CBC
Hematocrit: 39.9 % (ref 37.5–51.0)
Hemoglobin: 12.7 g/dL — ABNORMAL LOW (ref 13.0–17.7)
MCH: 27.8 pg (ref 26.6–33.0)
MCHC: 31.8 g/dL (ref 31.5–35.7)
MCV: 87 fL (ref 79–97)
Platelets: 159 10*3/uL (ref 150–450)
RBC: 4.57 x10E6/uL (ref 4.14–5.80)
RDW: 12.9 % (ref 11.6–15.4)
WBC: 5.6 10*3/uL (ref 3.4–10.8)

## 2021-11-09 LAB — BASIC METABOLIC PANEL
BUN/Creatinine Ratio: 14 (ref 10–24)
BUN: 18 mg/dL (ref 8–27)
CO2: 24 mmol/L (ref 20–29)
Calcium: 9 mg/dL (ref 8.6–10.2)
Chloride: 110 mmol/L — ABNORMAL HIGH (ref 96–106)
Creatinine, Ser: 1.32 mg/dL — ABNORMAL HIGH (ref 0.76–1.27)
Glucose: 98 mg/dL (ref 70–99)
Potassium: 4.2 mmol/L (ref 3.5–5.2)
Sodium: 146 mmol/L — ABNORMAL HIGH (ref 134–144)
eGFR: 58 mL/min/{1.73_m2} — ABNORMAL LOW (ref >59)

## 2021-11-09 NOTE — Telephone Encounter (Signed)
Reviewed procedure instructions with patient.  

## 2021-11-09 NOTE — Addendum Note (Signed)
Addended by: Katrine Coho on: 11/09/2021 11:26 AM   Modules accepted: Orders

## 2021-11-09 NOTE — Telephone Encounter (Addendum)
Cardiac Catheterization scheduled at [facility] for: Wednesday November 10, 2021 8:30 AM Arrival time and place: [place] Entrance A at: 6:30 AM  Nothing to eat after midnight prior to procedure, clear liquids until 5 AM day of procedure.  Medication instructions: -Hold:  Farxiga/Actos/Januvia-AM of procedure -Except hold medications usual morning medications can be taken with sips of water including Plavix 75 mg and aspirin 81 mg.  Confirmed patient has responsible adult to drive home post procedure and be with patient first 24 hours after arriving home.  Patient reports no new symptoms concerning for COVID-19 in the past 10 days.  Reviewed procedure instructions with patient.  Patient reports he started taking aspirin 81 mg yesterday and has tolerated it without itching, rash or other signs of allergic reaction/intolerance.

## 2021-11-09 NOTE — Telephone Encounter (Signed)
Patient returned call, transferred to RN.

## 2021-11-10 ENCOUNTER — Other Ambulatory Visit (HOSPITAL_COMMUNITY): Payer: Medicare Other

## 2021-11-10 ENCOUNTER — Encounter (HOSPITAL_COMMUNITY)
Admission: RE | Disposition: A | Discharge status: Home / Self Care | Payer: Medicare Other | Source: Home / Self Care | Attending: Internal Medicine

## 2021-11-10 ENCOUNTER — Other Ambulatory Visit: Payer: Self-pay | Admitting: Cardiology

## 2021-11-10 ENCOUNTER — Encounter (HOSPITAL_COMMUNITY): Payer: Self-pay | Admitting: Internal Medicine

## 2021-11-10 ENCOUNTER — Other Ambulatory Visit: Payer: Self-pay

## 2021-11-10 ENCOUNTER — Ambulatory Visit (HOSPITAL_COMMUNITY)
Admission: RE | Admit: 2021-11-10 | Discharge: 2021-11-10 | Disposition: A | Discharge status: Home / Self Care | Payer: Medicare Other | Attending: Internal Medicine | Admitting: Internal Medicine

## 2021-11-10 DIAGNOSIS — I10 Essential (primary) hypertension: Secondary | ICD-10-CM | POA: Insufficient documentation

## 2021-11-10 DIAGNOSIS — E119 Type 2 diabetes mellitus without complications: Secondary | ICD-10-CM | POA: Diagnosis not present

## 2021-11-10 DIAGNOSIS — Z8673 Personal history of transient ischemic attack (TIA), and cerebral infarction without residual deficits: Secondary | ICD-10-CM | POA: Diagnosis not present

## 2021-11-10 DIAGNOSIS — Z7902 Long term (current) use of antithrombotics/antiplatelets: Secondary | ICD-10-CM | POA: Insufficient documentation

## 2021-11-10 DIAGNOSIS — R9439 Abnormal result of other cardiovascular function study: Secondary | ICD-10-CM | POA: Diagnosis present

## 2021-11-10 DIAGNOSIS — I25110 Atherosclerotic heart disease of native coronary artery with unstable angina pectoris: Secondary | ICD-10-CM | POA: Insufficient documentation

## 2021-11-10 DIAGNOSIS — Z7984 Long term (current) use of oral hypoglycemic drugs: Secondary | ICD-10-CM | POA: Insufficient documentation

## 2021-11-10 DIAGNOSIS — R0609 Other forms of dyspnea: Secondary | ICD-10-CM | POA: Insufficient documentation

## 2021-11-10 DIAGNOSIS — E785 Hyperlipidemia, unspecified: Secondary | ICD-10-CM | POA: Diagnosis not present

## 2021-11-10 DIAGNOSIS — I2510 Atherosclerotic heart disease of native coronary artery without angina pectoris: Secondary | ICD-10-CM | POA: Diagnosis not present

## 2021-11-10 DIAGNOSIS — I2582 Chronic total occlusion of coronary artery: Secondary | ICD-10-CM | POA: Diagnosis not present

## 2021-11-10 DIAGNOSIS — I25119 Atherosclerotic heart disease of native coronary artery with unspecified angina pectoris: Secondary | ICD-10-CM

## 2021-11-10 HISTORY — PX: LEFT HEART CATH AND CORONARY ANGIOGRAPHY: CATH118249

## 2021-11-10 LAB — GLUCOSE, CAPILLARY: Glucose-Capillary: 120 mg/dL — ABNORMAL HIGH (ref 70–99)

## 2021-11-10 SURGERY — LEFT HEART CATH AND CORONARY ANGIOGRAPHY
Anesthesia: LOCAL

## 2021-11-10 MED ORDER — MIDAZOLAM HCL 2 MG/2ML IJ SOLN
INTRAMUSCULAR | Status: AC
  Filled 2021-11-10: qty 2

## 2021-11-10 MED ORDER — HEPARIN (PORCINE) IN NACL 1000-0.9 UT/500ML-% IV SOLN
INTRAVENOUS | Status: DC | PRN
  Administered 2021-11-10 (×2): 500 mL

## 2021-11-10 MED ORDER — SODIUM CHLORIDE 0.9 % IV SOLN: 250.0000 mL | INTRAVENOUS | Status: DC | PRN

## 2021-11-10 MED ORDER — ACETAMINOPHEN 325 MG PO TABS: 650.0000 mg | ORAL_TABLET | ORAL | Status: DC | PRN

## 2021-11-10 MED ORDER — VERAPAMIL HCL 2.5 MG/ML IV SOLN
INTRAVENOUS | Status: AC
  Filled 2021-11-10: qty 2

## 2021-11-10 MED ORDER — ASPIRIN 81 MG PO CHEW: 81.0000 mg | CHEWABLE_TABLET | ORAL | Status: DC

## 2021-11-10 MED ORDER — FENTANYL CITRATE (PF) 100 MCG/2ML IJ SOLN
INTRAMUSCULAR | Status: DC | PRN
  Administered 2021-11-10 (×2): 25 ug via INTRAVENOUS

## 2021-11-10 MED ORDER — HEPARIN (PORCINE) IN NACL 1000-0.9 UT/500ML-% IV SOLN
INTRAVENOUS | Status: AC
  Filled 2021-11-10: qty 1000

## 2021-11-10 MED ORDER — IOHEXOL 350 MG/ML SOLN
INTRAVENOUS | Status: DC | PRN
  Administered 2021-11-10: 30 mL

## 2021-11-10 MED ORDER — LIDOCAINE HCL (PF) 1 % IJ SOLN
INTRAMUSCULAR | Status: DC | PRN
  Administered 2021-11-10: 2 mL via INTRADERMAL

## 2021-11-10 MED ORDER — FENTANYL CITRATE (PF) 100 MCG/2ML IJ SOLN
INTRAMUSCULAR | Status: AC
  Filled 2021-11-10: qty 2

## 2021-11-10 MED ORDER — LABETALOL HCL 5 MG/ML IV SOLN: 10.0000 mg | INTRAVENOUS | Status: DC | PRN

## 2021-11-10 MED ORDER — SODIUM CHLORIDE 0.9% FLUSH: 3.0000 mL | Freq: Two times a day (BID) | INTRAVENOUS | Status: DC

## 2021-11-10 MED ORDER — VERAPAMIL HCL 2.5 MG/ML IV SOLN
INTRAVENOUS | Status: DC | PRN
  Administered 2021-11-10: 10 mL via INTRA_ARTERIAL

## 2021-11-10 MED ORDER — MIDAZOLAM HCL 2 MG/2ML IJ SOLN
INTRAMUSCULAR | Status: DC | PRN
  Administered 2021-11-10 (×2): 1 mg via INTRAVENOUS

## 2021-11-10 MED ORDER — SODIUM CHLORIDE 0.9% FLUSH: 3.0000 mL | INTRAVENOUS | Status: DC | PRN

## 2021-11-10 MED ORDER — SODIUM CHLORIDE 0.9 % IV SOLN: INTRAVENOUS | Status: DC

## 2021-11-10 MED ORDER — SODIUM CHLORIDE 0.9 % WEIGHT BASED INFUSION
3.0000 mL/kg/h | INTRAVENOUS | Status: AC
  Administered 2021-11-10: 3 mL/kg/h via INTRAVENOUS

## 2021-11-10 MED ORDER — HEPARIN SODIUM (PORCINE) 1000 UNIT/ML IJ SOLN
INTRAMUSCULAR | Status: AC
  Filled 2021-11-10: qty 10

## 2021-11-10 MED ORDER — HYDRALAZINE HCL 20 MG/ML IJ SOLN: 10.0000 mg | INTRAMUSCULAR | Status: DC | PRN

## 2021-11-10 MED ORDER — LIDOCAINE HCL (PF) 1 % IJ SOLN
INTRAMUSCULAR | Status: AC
  Filled 2021-11-10: qty 30

## 2021-11-10 MED ORDER — SODIUM CHLORIDE 0.9 % WEIGHT BASED INFUSION: 1.0000 mL/kg/h | INTRAVENOUS | Status: DC

## 2021-11-10 MED ORDER — ONDANSETRON HCL 4 MG/2ML IJ SOLN: 4.0000 mg | Freq: Four times a day (QID) | INTRAMUSCULAR | Status: DC | PRN

## 2021-11-10 MED ORDER — HEPARIN SODIUM (PORCINE) 1000 UNIT/ML IJ SOLN
INTRAMUSCULAR | Status: DC | PRN
  Administered 2021-11-10: 5000 [IU] via INTRAVENOUS

## 2021-11-10 SURGICAL SUPPLY — 8 items
BAND ZEPHYR COMPRESS 30 LONG (HEMOSTASIS) IMPLANT
CATH OPTITORQUE TIG 4.0 6F (CATHETERS) IMPLANT
GLIDESHEATH SLEND SS 6F .021 (SHEATH) IMPLANT
KIT HEART LEFT (KITS) ×1 IMPLANT
PACK CARDIAC CATHETERIZATION (CUSTOM PROCEDURE TRAY) ×1 IMPLANT
TRANSDUCER W/STOPCOCK (MISCELLANEOUS) ×1 IMPLANT
TUBING CIL FLEX 10 FLL-RA (TUBING) ×1 IMPLANT
WIRE EMERALD 3MM-J .035X260CM (WIRE) IMPLANT

## 2021-11-10 NOTE — Interval H&P Note (Signed)
History and Physical Interval Note:  11/10/2021 6:53 AM  Andrew [name]  has presented today for surgery, with the diagnosis of unstable angna.  The various methods of treatment have been discussed with the patient and family. After consideration of risks, benefits and other options for treatment, the patient has consented to  Procedure(s): LEFT HEART CATH AND CORONARY ANGIOGRAPHY (N/A) as a surgical intervention.  The patient's history has been reviewed, patient examined, no change in status, stable for surgery.  I have reviewed the patient's chart and labs.  Questions were answered to the patient's satisfaction.     Early Osmond

## 2021-11-10 NOTE — Interval H&P Note (Signed)
History and Physical Interval Note:  11/10/2021 7:34 AM  Andrew [name]  has presented today for surgery, with the diagnosis of unstable angna.  The various methods of treatment have been discussed with the patient and family. After consideration of risks, benefits and other options for treatment, the patient has consented to  Procedure(s): LEFT HEART CATH AND CORONARY ANGIOGRAPHY (N/A) as a surgical intervention.  The patient's history has been reviewed, patient examined, no change in status, stable for surgery.  I have reviewed the patient's chart and labs.  Questions were answered to the patient's satisfaction.     Early Osmond

## 2021-11-10 NOTE — Progress Notes (Signed)
TR band removed, No s/s of bleeding or hematoma noted to site. Arm brace in place

## 2021-11-10 NOTE — Progress Notes (Signed)
Patient and wife received d/c information written and verbal. All questions and concerns addressed. All PIVs removed and intact. Pt in stable condition upon d/c

## 2021-11-18 ENCOUNTER — Ambulatory Visit (INDEPENDENT_AMBULATORY_CARE_PROVIDER_SITE_OTHER): Payer: Medicare Other

## 2021-11-18 DIAGNOSIS — I25110 Atherosclerotic heart disease of native coronary artery with unstable angina pectoris: Secondary | ICD-10-CM

## 2021-11-18 LAB — ECHOCARDIOGRAM COMPLETE
Area-P 1/2: 3.31 cm2
MV M vel: 4.66 m/s
MV Peak grad: 86.9 mmHg
S' Lateral: 3.17 cm

## 2021-11-19 ENCOUNTER — Other Ambulatory Visit (HOSPITAL_COMMUNITY): Payer: Medicare Other

## 2021-11-22 ENCOUNTER — Encounter: Payer: Self-pay | Admitting: *Deleted

## 2021-11-22 ENCOUNTER — Other Ambulatory Visit: Payer: Self-pay | Admitting: *Deleted

## 2021-11-22 ENCOUNTER — Institutional Professional Consult (permissible substitution) (INDEPENDENT_AMBULATORY_CARE_PROVIDER_SITE_OTHER): Payer: Medicare Other | Admitting: Cardiothoracic Surgery

## 2021-11-22 ENCOUNTER — Encounter: Payer: Self-pay | Admitting: Cardiothoracic Surgery

## 2021-11-22 VITALS — BP 155/73 | HR 62 | Resp 18 | Ht 74.0 in | Wt 228.0 lb

## 2021-11-22 DIAGNOSIS — Z5181 Encounter for therapeutic drug level monitoring: Secondary | ICD-10-CM

## 2021-11-22 DIAGNOSIS — R9439 Abnormal result of other cardiovascular function study: Secondary | ICD-10-CM

## 2021-11-22 DIAGNOSIS — I25119 Atherosclerotic heart disease of native coronary artery with unspecified angina pectoris: Secondary | ICD-10-CM

## 2021-11-22 DIAGNOSIS — I2510 Atherosclerotic heart disease of native coronary artery without angina pectoris: Secondary | ICD-10-CM

## 2021-11-22 DIAGNOSIS — R7989 Other specified abnormal findings of blood chemistry: Secondary | ICD-10-CM
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
## 2021-11-23 ENCOUNTER — Telehealth: Payer: Self-pay | Admitting: Family Medicine

## 2021-11-23 ENCOUNTER — Ambulatory Visit (INDEPENDENT_AMBULATORY_CARE_PROVIDER_SITE_OTHER): Payer: Medicare Other

## 2021-11-23 DIAGNOSIS — Z23 Encounter for immunization: Secondary | ICD-10-CM | POA: Diagnosis not present

## 2021-11-23 NOTE — Telephone Encounter (Signed)
Patient is scheduled to have bypass surgery on 11/29 but the surgeon's office requested that he call Dr. Lorelei Pont to see if she recommends that he get the flu shot before his surgery. Please call patient to advise and schedule if needed.

## 2021-11-23 NOTE — Telephone Encounter (Signed)
Pt has been scheduled.  °

## 2021-11-26 ENCOUNTER — Other Ambulatory Visit (HOSPITAL_COMMUNITY): Payer: Medicare Other

## 2021-12-03 NOTE — Pre-Procedure Instructions (Signed)
Surgical Instructions    Your procedure is scheduled on Wednesday November 29.  Report to [facility] Main Entrance "A" at 5:30 A.M., then check in with the Admitting office.  Call this number if you have problems the morning of surgery:  [phone]  If you have any questions prior to your surgery date call [phone]: Open Monday-Friday 8am-4pm  If you experience any cold, Covid, or flu symptoms such as cough, fever, chills, shortness of breath, etc. between now and your scheduled surgery, please notify us at the above number   Patient Instructions   The night before surgery:    No food or drink after midnight.          If you have questions, please contact your surgeon's office.    Take these medications the morning of surgery with A SIP OF WATER:  amLODipine (NORVASC)  carbamazepine (TEGRETOL)  carvedilol (COREG)  cetirizine (ZYRTEC)  dorzolamide-timolol (COSOPT)  Esomeprazole Magnesium (NEXIUM 24HR)  fluticasone (FLONASE)  rosuvastatin (CRESTOR)   You may take these medications with A SIP OF WATER IF YOU NEED THEM: Saline (ARY NASAL MIST ALLERGY/SINUS NA)    Follow your surgeon's instructions on when to stop Aspirin.  If no instructions were given by your surgeon then you will need to call the office to get those instructions.    STOP clopidogrel (PLAVIX) 7 days prior to surgery. Last dose Wednesday 11/22.  As of today, STOP taking any Aspirin (unless otherwise instructed by your surgeon) Aleve, Naproxen, Ibuprofen, Motrin, Advil, Goody's, BC's, all herbal medications, fish oil, and all vitamins.   WHAT DO I DO ABOUT MY DIABETES MEDICATION?  Do not take pioglitazone (ACTOS) OR sitaGLIPtin (JANUVIA)  the morning of surgery.  STOP dapagliflozin propanediol (FARXIGA) 72 hours prior to surgery. Last dose Saturday 11/25.  HOW TO MANAGE YOUR DIABETES BEFORE AND AFTER SURGERY  Why is it important to control my blood sugar before and after surgery? Improving blood  sugar levels before and after surgery helps healing and can limit problems. A way of improving blood sugar control is eating a healthy diet by:  Eating less sugar and carbohydrates  Increasing activity/exercise  Talking with your doctor about reaching your blood sugar goals High blood sugars (greater than 180 mg/dL) can raise your risk of infections and slow your recovery, so you will need to focus on controlling your diabetes during the weeks before surgery. Make sure that the doctor who takes care of your diabetes knows about your planned surgery including the date and location.  How do I manage my blood sugar before surgery? Check your blood sugar at least 4 times a day, starting 2 days before surgery, to make sure that the level is not too high or low.  Check your blood sugar the morning of your surgery when you wake up and every 2 hours until you get to the Short Stay unit.  If your blood sugar is less than 70 mg/dL, you will need to treat for low blood sugar: Do not take insulin. Treat a low blood sugar (less than 70 mg/dL) with  cup of clear juice (cranberry or apple), 4 glucose tablets, OR glucose gel. Recheck blood sugar in 15 minutes after treatment (to make sure it is greater than 70 mg/dL). If your blood sugar is not greater than 70 mg/dL on recheck, call [phone] for further instructions. Report your blood sugar to the short stay nurse when you get to Short Stay.  If you are admitted to the hospital  after surgery: Your blood sugar will be checked by the staff and you will probably be given insulin after surgery (instead of oral diabetes medicines) to make sure you have good blood sugar levels. The goal for blood sugar control after surgery is 80-180 mg/dL.          Do NOT Smoke (Tobacco/Vaping)  24 hours prior to your procedure  If you use a CPAP at night, you may bring your mask for your overnight stay.   Contacts, glasses, hearing aids, dentures or partials may not be  worn into surgery, please bring cases for these belongings   For patients admitted to the hospital, discharge time will be determined by your treatment team.   Patients discharged the day of surgery will not be allowed to drive home, and someone needs to stay with them for 24 hours.   SURGICAL WAITING ROOM VISITATION Patients having surgery or a procedure may have no more than 2 support people in the waiting area - these visitors may rotate.   Children under the age of 75 must have an adult with them who is not the patient. If the patient needs to stay at the hospital during part of their recovery, the visitor guidelines for inpatient rooms apply. Pre-op nurse will coordinate an appropriate time for 1 support person to accompany patient in pre-op.  This support person may not rotate.   Please refer to the [facility] website for the visitor guidelines for Inpatients (after your surgery is over and you are in a regular room).    Special instructions:    Oral Hygiene is also important to reduce your risk of infection.  Remember -  BRUSH YOUR TEETH THE MORNING OF SURGERY WITH YOUR REGULAR TOOTHPASTE   - Preparing For Surgery  Before surgery, you can play an important role. Because skin is not sterile, your skin needs to be as free of germs as possible. You can reduce the number of germs on your skin by washing with CHG (chlorahexidine gluconate) Soap before surgery.  CHG is an antiseptic cleaner which kills germs and bonds with the skin to continue killing germs even after washing.     Please do not use if you have an allergy to CHG or antibacterial soaps. If your skin becomes reddened/irritated stop using the CHG.  Do not shave (including legs and underarms) for at least 48 hours prior to first CHG shower. It is OK to shave your face.  Please follow these instructions carefully.    Shower the NIGHT BEFORE SURGERY and the MORNING OF SURGERY with CHG Soap.  If you chose to  wash your hair, wash your hair first as usual with your normal shampoo.  After you shampoo, rinse your hair and body thoroughly to remove the shampoo.   Then [employer] and genitals (private parts) with your normal soap and rinse thoroughly to remove soap.  After that Use CHG Soap as you would any other liquid soap.  You can apply CHG directly to the skin and wash gently with a scrungie or a clean washcloth.   Apply the CHG Soap to your body ONLY FROM THE NECK DOWN.   Do not use on open wounds or open sores.  Avoid contact with your eyes, ears, mouth and genitals (private parts).  Wash thoroughly, paying special attention to the area where your surgery will be performed.  Thoroughly rinse your body with warm water from the neck down.  DO NOT shower/wash with your normal soap  after using and rinsing off the CHG Soap.  Pat yourself dry with a CLEAN TOWEL.  Wear CLEAN PAJAMAS to bed the night before surgery  Place CLEAN SHEETS on your bed the night before your surgery  DO NOT SLEEP WITH PETS.   Day of Surgery:  Take a shower with CHG soap. Wear Clean/Comfortable clothing the morning of surgery Brush your teeth WITH YOUR REGULAR TOOTHPASTE. Do not wear jewelry. Do not wear lotions, powders, cologne or deodorant. Men may shave face and neck. Do not bring valuables to the hospital.  [facility] is not responsible for any belongings or valuables.    If you received a COVID test during your pre-op visit, it is requested that you wear a mask when out in public, stay away from anyone that may not be feeling well, and notify your surgeon if you develop symptoms. If you have been in contact with anyone that has tested positive in the last 10 days, please notify your surgeon.    Please read over the following fact sheets that you were given.

## 2021-12-06 ENCOUNTER — Encounter (HOSPITAL_COMMUNITY)
Admission: RE | Admit: 2021-12-06 | Discharge: 2021-12-06 | Disposition: A | Discharge status: Home / Self Care | Payer: Medicare Other | Source: Ambulatory Visit | Attending: Cardiothoracic Surgery | Admitting: Cardiothoracic Surgery

## 2021-12-06 ENCOUNTER — Ambulatory Visit (HOSPITAL_BASED_OUTPATIENT_CLINIC_OR_DEPARTMENT_OTHER)
Admission: RE | Admit: 2021-12-06 | Discharge: 2021-12-06 | Disposition: A | Discharge status: Home / Self Care | Payer: Medicare Other | Source: Ambulatory Visit | Attending: Cardiothoracic Surgery | Admitting: Cardiothoracic Surgery

## 2021-12-06 ENCOUNTER — Ambulatory Visit (HOSPITAL_COMMUNITY)
Admission: RE | Admit: 2021-12-06 | Discharge: 2021-12-06 | Disposition: A | Discharge status: Home / Self Care | Payer: Medicare Other | Source: Ambulatory Visit | Attending: Cardiothoracic Surgery | Admitting: Cardiothoracic Surgery

## 2021-12-06 ENCOUNTER — Encounter (HOSPITAL_COMMUNITY): Payer: Self-pay

## 2021-12-06 ENCOUNTER — Other Ambulatory Visit: Payer: Self-pay

## 2021-12-06 VITALS — BP 166/77 | HR 56 | Temp 98.2°F | Resp 18 | Ht 74.0 in | Wt 231.6 lb

## 2021-12-06 DIAGNOSIS — E119 Type 2 diabetes mellitus without complications: Secondary | ICD-10-CM | POA: Insufficient documentation

## 2021-12-06 DIAGNOSIS — I083 Combined rheumatic disorders of mitral, aortic and tricuspid valves: Secondary | ICD-10-CM | POA: Diagnosis not present

## 2021-12-06 DIAGNOSIS — D696 Thrombocytopenia, unspecified: Secondary | ICD-10-CM | POA: Diagnosis not present

## 2021-12-06 DIAGNOSIS — R7989 Other specified abnormal findings of blood chemistry: Secondary | ICD-10-CM

## 2021-12-06 DIAGNOSIS — Z86010 Personal history of colonic polyps: Secondary | ICD-10-CM | POA: Diagnosis not present

## 2021-12-06 DIAGNOSIS — Z8673 Personal history of transient ischemic attack (TIA), and cerebral infarction without residual deficits: Secondary | ICD-10-CM | POA: Insufficient documentation

## 2021-12-06 DIAGNOSIS — Z7902 Long term (current) use of antithrombotics/antiplatelets: Secondary | ICD-10-CM | POA: Diagnosis not present

## 2021-12-06 DIAGNOSIS — M4802 Spinal stenosis, cervical region: Secondary | ICD-10-CM | POA: Diagnosis present

## 2021-12-06 DIAGNOSIS — J90 Pleural effusion, not elsewhere classified: Secondary | ICD-10-CM | POA: Diagnosis not present

## 2021-12-06 DIAGNOSIS — Z833 Family history of diabetes mellitus: Secondary | ICD-10-CM | POA: Diagnosis not present

## 2021-12-06 DIAGNOSIS — D62 Acute posthemorrhagic anemia: Secondary | ICD-10-CM | POA: Diagnosis not present

## 2021-12-06 DIAGNOSIS — Z01818 Encounter for other preprocedural examination: Secondary | ICD-10-CM | POA: Insufficient documentation

## 2021-12-06 DIAGNOSIS — Z800 Family history of malignant neoplasm of digestive organs: Secondary | ICD-10-CM | POA: Diagnosis not present

## 2021-12-06 DIAGNOSIS — I2510 Atherosclerotic heart disease of native coronary artery without angina pectoris: Secondary | ICD-10-CM

## 2021-12-06 DIAGNOSIS — I6523 Occlusion and stenosis of bilateral carotid arteries: Secondary | ICD-10-CM | POA: Insufficient documentation

## 2021-12-06 DIAGNOSIS — Z820 Family history of epilepsy and other diseases of the nervous system: Secondary | ICD-10-CM | POA: Diagnosis not present

## 2021-12-06 DIAGNOSIS — E669 Obesity, unspecified: Secondary | ICD-10-CM | POA: Diagnosis not present

## 2021-12-06 DIAGNOSIS — M5030 Other cervical disc degeneration, unspecified cervical region: Secondary | ICD-10-CM | POA: Diagnosis present

## 2021-12-06 DIAGNOSIS — Z8249 Family history of ischemic heart disease and other diseases of the circulatory system: Secondary | ICD-10-CM | POA: Diagnosis not present

## 2021-12-06 DIAGNOSIS — Z1152 Encounter for screening for COVID-19: Secondary | ICD-10-CM | POA: Diagnosis not present

## 2021-12-06 DIAGNOSIS — M1990 Unspecified osteoarthritis, unspecified site: Secondary | ICD-10-CM | POA: Diagnosis not present

## 2021-12-06 DIAGNOSIS — Z6830 Body mass index (BMI) 30.0-30.9, adult: Secondary | ICD-10-CM | POA: Diagnosis not present

## 2021-12-06 DIAGNOSIS — D72829 Elevated white blood cell count, unspecified: Secondary | ICD-10-CM | POA: Diagnosis not present

## 2021-12-06 DIAGNOSIS — I25119 Atherosclerotic heart disease of native coronary artery with unspecified angina pectoris: Secondary | ICD-10-CM | POA: Diagnosis not present

## 2021-12-06 DIAGNOSIS — J939 Pneumothorax, unspecified: Secondary | ICD-10-CM | POA: Diagnosis not present

## 2021-12-06 DIAGNOSIS — I10 Essential (primary) hypertension: Secondary | ICD-10-CM | POA: Insufficient documentation

## 2021-12-06 DIAGNOSIS — Z5181 Encounter for therapeutic drug level monitoring: Secondary | ICD-10-CM | POA: Insufficient documentation

## 2021-12-06 DIAGNOSIS — G40909 Epilepsy, unspecified, not intractable, without status epilepticus: Secondary | ICD-10-CM | POA: Diagnosis not present

## 2021-12-06 DIAGNOSIS — E785 Hyperlipidemia, unspecified: Secondary | ICD-10-CM | POA: Insufficient documentation

## 2021-12-06 DIAGNOSIS — H5461 Unqualified visual loss, right eye, normal vision left eye: Secondary | ICD-10-CM | POA: Diagnosis present

## 2021-12-06 DIAGNOSIS — J9811 Atelectasis: Secondary | ICD-10-CM | POA: Diagnosis not present

## 2021-12-06 DIAGNOSIS — Z823 Family history of stroke: Secondary | ICD-10-CM | POA: Diagnosis not present

## 2021-12-06 DIAGNOSIS — R918 Other nonspecific abnormal finding of lung field: Secondary | ICD-10-CM | POA: Diagnosis not present

## 2021-12-06 DIAGNOSIS — Z886 Allergy status to analgesic agent status: Secondary | ICD-10-CM | POA: Diagnosis not present

## 2021-12-06 HISTORY — DX: Pneumonia, unspecified organism: J18.9

## 2021-12-06 LAB — CBC
HCT: 39.4 % (ref 39.0–52.0)
Hemoglobin: 12.4 g/dL — ABNORMAL LOW (ref 13.0–17.0)
MCH: 28.8 pg (ref 26.0–34.0)
MCHC: 31.5 g/dL (ref 30.0–36.0)
MCV: 91.4 fL (ref 80.0–100.0)
Platelets: 130 10*3/uL — ABNORMAL LOW (ref 150–400)
RBC: 4.31 MIL/uL (ref 4.22–5.81)
RDW: 12.7 % (ref 11.5–15.5)
WBC: 4.4 10*3/uL (ref 4.0–10.5)
nRBC: 0 % (ref 0.0–0.2)

## 2021-12-06 LAB — COMPREHENSIVE METABOLIC PANEL
ALT: 17 U/L (ref 0–44)
AST: 19 U/L (ref 15–41)
Albumin: 4 g/dL (ref 3.5–5.0)
Alkaline Phosphatase: 79 U/L (ref 38–126)
Anion gap: 7 (ref 5–15)
BUN: 14 mg/dL (ref 8–23)
CO2: 24 mmol/L (ref 22–32)
Calcium: 8.8 mg/dL — ABNORMAL LOW (ref 8.9–10.3)
Chloride: 109 mmol/L (ref 98–111)
Creatinine, Ser: 1.13 mg/dL (ref 0.61–1.24)
GFR, Estimated: >60 mL/min (ref >60)
Glucose, Bld: 124 mg/dL — ABNORMAL HIGH (ref 70–99)
Potassium: 3.9 mmol/L (ref 3.5–5.1)
Sodium: 140 mmol/L (ref 135–145)
Total Bilirubin: 0.4 mg/dL (ref 0.3–1.2)
Total Protein: 7.5 g/dL (ref 6.5–8.1)

## 2021-12-06 LAB — URINALYSIS, ROUTINE W REFLEX MICROSCOPIC
Bilirubin Urine: NEGATIVE
Glucose, UA: >=500 mg/dL — AB
Hgb urine dipstick: NEGATIVE
Ketones, ur: NEGATIVE mg/dL
Leukocytes,Ua: NEGATIVE
Nitrite: NEGATIVE
Protein, ur: NEGATIVE mg/dL
Specific Gravity, Urine: 1.02 (ref 1.005–1.030)
pH: 5.5 (ref 5.0–8.0)

## 2021-12-06 LAB — SURGICAL PCR SCREEN
MRSA, PCR: NEGATIVE
Staphylococcus aureus: POSITIVE — AB

## 2021-12-06 LAB — PROTIME-INR
INR: 1.2 (ref 0.8–1.2)
Prothrombin Time: 15.1 seconds (ref 11.4–15.2)

## 2021-12-06 LAB — GLUCOSE, CAPILLARY: Glucose-Capillary: 128 mg/dL — ABNORMAL HIGH (ref 70–99)

## 2021-12-06 LAB — TYPE AND SCREEN
ABO/RH(D): O POS
Antibody Screen: NEGATIVE

## 2021-12-06 LAB — BLOOD GAS, ARTERIAL
Acid-Base Excess: 0.8 mmol/L (ref 0.0–2.0)
Bicarbonate: 27.1 mmol/L (ref 20.0–28.0)
O2 Saturation: 70.6 %
Patient temperature: 37
pCO2 arterial: 49 mmHg — ABNORMAL HIGH (ref 32–48)
pH, Arterial: 7.35 (ref 7.35–7.45)
pO2, Arterial: 43 mmHg — ABNORMAL LOW (ref 83–108)

## 2021-12-06 LAB — URINALYSIS, MICROSCOPIC (REFLEX)

## 2021-12-06 LAB — APTT: aPTT: 33 seconds (ref 24–36)

## 2021-12-06 NOTE — Progress Notes (Signed)
PCP - [place] Cardiologist - Dr.Paula Harrington Challenger  PPM/ICD - pt denies Device Orders - n/a Rep Notified - n/a  Chest x-ray - 12/06/21 EKG - 12/06/21 Stress Test - 11/02/21 ECHO - 11/18/21 Cardiac Cath - 11/10/21  Sleep Study -yes- pt denies OSA CPAP - pt denies  Fasting Blood Sugar - pt states BS 125 at home usually after eating. He does not check this fasting per pt Checks Blood Sugar 1-2 times per month per pt  Last dose of GLP1 agonist-  pt stopped taking [place] and Januvia on 12/04/21 GLP1 instructions: pt aware not to take between now and surgery   Blood Thinner Instructions: Plavix pt stopped 10 days ago per pt. Pt was told to stop this per surgeon's office Aspirin Instructions: pt currently taking 81 mg Aspirin per surgeon's office  ERAS Protcol -NPO after Midnight  PRE-SURGERY Ensure or G2- none ordered   COVID TEST- yes today    Anesthesia review: yes. Cardiac history   Patient will call surgeon's office TODAY to ask about Clonidine patch that he currently has on.   Patient denies shortness of breath, fever, cough and chest pain at PAT appointment   All instructions explained to the patient, with a verbal understanding of the material. Patient agrees to go over the instructions while at home for a better understanding.The opportunity to ask questions was provided.

## 2021-12-06 NOTE — Progress Notes (Signed)
Pre CABG has been completed.   Preliminary results in CV Proc.   Andrew [name] 12/06/2021 3:56 PM

## 2021-12-07 ENCOUNTER — Telehealth: Payer: Self-pay

## 2021-12-07 ENCOUNTER — Other Ambulatory Visit: Payer: Self-pay | Admitting: Cardiothoracic Surgery

## 2021-12-07 LAB — HEMOGLOBIN A1C
Hgb A1c MFr Bld: 6.2 % — ABNORMAL HIGH (ref 4.8–5.6)
Mean Plasma Glucose: 131 mg/dL

## 2021-12-07 LAB — SARS CORONAVIRUS 2 (TAT 6-24 HRS): SARS Coronavirus 2: NEGATIVE

## 2021-12-07 MED ORDER — PHENYLEPHRINE HCL-NACL 20-0.9 MG/250ML-% IV SOLN
30.0000 ug/min | INTRAVENOUS | Status: AC
  Administered 2021-12-08: 40 ug/min via INTRAVENOUS
  Filled 2021-12-07: qty 250

## 2021-12-07 MED ORDER — NOREPINEPHRINE 4 MG/250ML-% IV SOLN
0.0000 ug/min | INTRAVENOUS | Status: DC
  Filled 2021-12-07: qty 250

## 2021-12-07 MED ORDER — TEMAZEPAM 7.5 MG PO CAPS: 7.5000 mg | ORAL_CAPSULE | Freq: Every evening | ORAL | 0 refills | Status: DC | PRN

## 2021-12-07 MED ORDER — INSULIN REGULAR(HUMAN) IN NACL 100-0.9 UT/100ML-% IV SOLN
INTRAVENOUS | Status: AC
  Administered 2021-12-08: 2.2 [IU]/h via INTRAVENOUS
  Filled 2021-12-07: qty 100

## 2021-12-07 MED ORDER — MAGNESIUM SULFATE 50 % IJ SOLN
40.0000 meq | INTRAMUSCULAR | Status: DC
  Filled 2021-12-07: qty 9.85

## 2021-12-07 MED ORDER — HEPARIN 30,000 UNITS/1000 ML (OHS) CELLSAVER SOLUTION
Status: DC
  Filled 2021-12-07: qty 1000

## 2021-12-07 MED ORDER — MILRINONE LACTATE IN DEXTROSE 20-5 MG/100ML-% IV SOLN
0.3000 ug/kg/min | INTRAVENOUS | Status: AC
  Administered 2021-12-08: .25 ug/kg/min via INTRAVENOUS
  Filled 2021-12-07: qty 100

## 2021-12-07 MED ORDER — TRANEXAMIC ACID (OHS) BOLUS VIA INFUSION
15.0000 mg/kg | INTRAVENOUS | Status: AC
  Administered 2021-12-08: 1576.5 mg via INTRAVENOUS
  Filled 2021-12-07: qty 1577

## 2021-12-07 MED ORDER — CEFAZOLIN SODIUM-DEXTROSE 2-4 GM/100ML-% IV SOLN
2.0000 g | INTRAVENOUS | Status: AC
  Administered 2021-12-08: 2 g via INTRAVENOUS
  Filled 2021-12-07: qty 100

## 2021-12-07 MED ORDER — TRANEXAMIC ACID (OHS) PUMP PRIME SOLUTION
2.0000 mg/kg | INTRAVENOUS | Status: DC
  Filled 2021-12-07: qty 2.1

## 2021-12-07 MED ORDER — VANCOMYCIN HCL 1500 MG/300ML IV SOLN
1500.0000 mg | INTRAVENOUS | Status: AC
  Administered 2021-12-08: 1500 mg via INTRAVENOUS
  Filled 2021-12-07: qty 300

## 2021-12-07 MED ORDER — DEXMEDETOMIDINE HCL IN NACL 400 MCG/100ML IV SOLN
0.1000 ug/kg/h | INTRAVENOUS | Status: AC
  Administered 2021-12-08: .5 ug/kg/h via INTRAVENOUS
  Filled 2021-12-07: qty 100

## 2021-12-07 MED ORDER — TRANEXAMIC ACID 1000 MG/10ML IV SOLN
1.5000 mg/kg/h | INTRAVENOUS | Status: AC
  Administered 2021-12-08: 1.5 mg/kg/h via INTRAVENOUS
  Filled 2021-12-07: qty 25

## 2021-12-07 MED ORDER — POTASSIUM CHLORIDE 2 MEQ/ML IV SOLN
80.0000 meq | INTRAVENOUS | Status: DC
  Filled 2021-12-07: qty 40

## 2021-12-07 MED ORDER — EPINEPHRINE HCL 5 MG/250ML IV SOLN IN NS
0.0000 ug/min | INTRAVENOUS | Status: DC
  Filled 2021-12-07: qty 250

## 2021-12-07 MED ORDER — PLASMA-LYTE A IV SOLN
INTRAVENOUS | Status: DC
  Filled 2021-12-07: qty 2.5

## 2021-12-07 NOTE — Telephone Encounter (Signed)
-----   Message from [name], MD sent at 12/07/2021  3:36 PM EST ----- Regarding: RE: Requesting medication to help relax/sleep tonight Ok   I will order some restoril ----- Message ----- From: Donnella Sham, RN Sent: 12/07/2021  10:22 AM EST To: [name], MD Subject: Requesting medication to help relax/sleep to#  Hey Dr. Prescott Gum,  He is requesting something to help him sleep/ relax tonight before surgery tomorrow. Please advise. His pharmacy is:  [id number] [address], [place] AT [address]  Phone: [phone] Fax: [phone]  Thanks, Caryl Pina

## 2021-12-07 NOTE — Telephone Encounter (Signed)
Patient called and aware of medication pick-up.

## 2021-12-08 ENCOUNTER — Inpatient Hospital Stay (HOSPITAL_COMMUNITY): Payer: Medicare Other | Admitting: Physician Assistant

## 2021-12-08 ENCOUNTER — Other Ambulatory Visit: Payer: Self-pay

## 2021-12-08 ENCOUNTER — Inpatient Hospital Stay (HOSPITAL_COMMUNITY): Payer: Medicare Other

## 2021-12-08 ENCOUNTER — Encounter (HOSPITAL_COMMUNITY): Payer: Self-pay | Admitting: Cardiothoracic Surgery

## 2021-12-08 ENCOUNTER — Inpatient Hospital Stay (HOSPITAL_COMMUNITY)
Admission: RE | Admit: 2021-12-08 | Discharge: 2021-12-14 | DRG: 236 | Disposition: A | Discharge status: Home Health | Payer: Medicare Other | Attending: Cardiothoracic Surgery | Admitting: Cardiothoracic Surgery

## 2021-12-08 ENCOUNTER — Ambulatory Visit: Payer: Medicare Other | Admitting: Internal Medicine

## 2021-12-08 ENCOUNTER — Inpatient Hospital Stay (HOSPITAL_COMMUNITY)
Admission: RE | Disposition: A | Discharge status: Home Health | Payer: Self-pay | Source: Home / Self Care | Attending: Cardiothoracic Surgery

## 2021-12-08 DIAGNOSIS — D72829 Elevated white blood cell count, unspecified: Secondary | ICD-10-CM | POA: Diagnosis not present

## 2021-12-08 DIAGNOSIS — Z91040 Latex allergy status: Secondary | ICD-10-CM

## 2021-12-08 DIAGNOSIS — Z886 Allergy status to analgesic agent status: Secondary | ICD-10-CM

## 2021-12-08 DIAGNOSIS — Z823 Family history of stroke: Secondary | ICD-10-CM | POA: Diagnosis not present

## 2021-12-08 DIAGNOSIS — Z8249 Family history of ischemic heart disease and other diseases of the circulatory system: Secondary | ICD-10-CM

## 2021-12-08 DIAGNOSIS — H5461 Unqualified visual loss, right eye, normal vision left eye: Secondary | ICD-10-CM | POA: Diagnosis present

## 2021-12-08 DIAGNOSIS — R2689 Other abnormalities of gait and mobility: Secondary | ICD-10-CM | POA: Diagnosis present

## 2021-12-08 DIAGNOSIS — I6523 Occlusion and stenosis of bilateral carotid arteries: Secondary | ICD-10-CM | POA: Diagnosis present

## 2021-12-08 DIAGNOSIS — Z6830 Body mass index (BMI) 30.0-30.9, adult: Secondary | ICD-10-CM | POA: Diagnosis not present

## 2021-12-08 DIAGNOSIS — R9439 Abnormal result of other cardiovascular function study: Secondary | ICD-10-CM

## 2021-12-08 DIAGNOSIS — M109 Gout, unspecified: Secondary | ICD-10-CM | POA: Diagnosis present

## 2021-12-08 DIAGNOSIS — H409 Unspecified glaucoma: Secondary | ICD-10-CM | POA: Diagnosis present

## 2021-12-08 DIAGNOSIS — I10 Essential (primary) hypertension: Secondary | ICD-10-CM | POA: Diagnosis present

## 2021-12-08 DIAGNOSIS — E785 Hyperlipidemia, unspecified: Secondary | ICD-10-CM | POA: Diagnosis present

## 2021-12-08 DIAGNOSIS — Z79899 Other long term (current) drug therapy: Secondary | ICD-10-CM

## 2021-12-08 DIAGNOSIS — M5030 Other cervical disc degeneration, unspecified cervical region: Secondary | ICD-10-CM | POA: Diagnosis present

## 2021-12-08 DIAGNOSIS — Z820 Family history of epilepsy and other diseases of the nervous system: Secondary | ICD-10-CM

## 2021-12-08 DIAGNOSIS — K219 Gastro-esophageal reflux disease without esophagitis: Secondary | ICD-10-CM | POA: Diagnosis present

## 2021-12-08 DIAGNOSIS — Z888 Allergy status to other drugs, medicaments and biological substances status: Secondary | ICD-10-CM

## 2021-12-08 DIAGNOSIS — Z951 Presence of aortocoronary bypass graft: Principal | ICD-10-CM

## 2021-12-08 DIAGNOSIS — D696 Thrombocytopenia, unspecified: Secondary | ICD-10-CM | POA: Diagnosis present

## 2021-12-08 DIAGNOSIS — Z8719 Personal history of other diseases of the digestive system: Secondary | ICD-10-CM

## 2021-12-08 DIAGNOSIS — Z8673 Personal history of transient ischemic attack (TIA), and cerebral infarction without residual deficits: Secondary | ICD-10-CM

## 2021-12-08 DIAGNOSIS — E119 Type 2 diabetes mellitus without complications: Secondary | ICD-10-CM

## 2021-12-08 DIAGNOSIS — I25119 Atherosclerotic heart disease of native coronary artery with unspecified angina pectoris: Principal | ICD-10-CM | POA: Diagnosis present

## 2021-12-08 DIAGNOSIS — J9811 Atelectasis: Secondary | ICD-10-CM | POA: Diagnosis not present

## 2021-12-08 DIAGNOSIS — J939 Pneumothorax, unspecified: Secondary | ICD-10-CM | POA: Diagnosis not present

## 2021-12-08 DIAGNOSIS — Z5181 Encounter for therapeutic drug level monitoring: Secondary | ICD-10-CM

## 2021-12-08 DIAGNOSIS — Z885 Allergy status to narcotic agent status: Secondary | ICD-10-CM

## 2021-12-08 DIAGNOSIS — I2510 Atherosclerotic heart disease of native coronary artery without angina pectoris: Secondary | ICD-10-CM | POA: Diagnosis present

## 2021-12-08 DIAGNOSIS — Z7984 Long term (current) use of oral hypoglycemic drugs: Secondary | ICD-10-CM

## 2021-12-08 DIAGNOSIS — Z7982 Long term (current) use of aspirin: Secondary | ICD-10-CM

## 2021-12-08 DIAGNOSIS — Z833 Family history of diabetes mellitus: Secondary | ICD-10-CM

## 2021-12-08 DIAGNOSIS — M1990 Unspecified osteoarthritis, unspecified site: Secondary | ICD-10-CM | POA: Diagnosis present

## 2021-12-08 DIAGNOSIS — I083 Combined rheumatic disorders of mitral, aortic and tricuspid valves: Secondary | ICD-10-CM | POA: Diagnosis not present

## 2021-12-08 DIAGNOSIS — E669 Obesity, unspecified: Secondary | ICD-10-CM | POA: Diagnosis present

## 2021-12-08 DIAGNOSIS — Z8701 Personal history of pneumonia (recurrent): Secondary | ICD-10-CM

## 2021-12-08 DIAGNOSIS — M4802 Spinal stenosis, cervical region: Secondary | ICD-10-CM | POA: Diagnosis present

## 2021-12-08 DIAGNOSIS — Z86010 Personal history of colonic polyps: Secondary | ICD-10-CM | POA: Diagnosis not present

## 2021-12-08 DIAGNOSIS — R7989 Other specified abnormal findings of blood chemistry: Secondary | ICD-10-CM | POA: Diagnosis present

## 2021-12-08 DIAGNOSIS — Z800 Family history of malignant neoplasm of digestive organs: Secondary | ICD-10-CM | POA: Diagnosis not present

## 2021-12-08 DIAGNOSIS — R509 Fever, unspecified: Secondary | ICD-10-CM | POA: Diagnosis not present

## 2021-12-08 DIAGNOSIS — Z1152 Encounter for screening for COVID-19: Secondary | ICD-10-CM

## 2021-12-08 DIAGNOSIS — D62 Acute posthemorrhagic anemia: Secondary | ICD-10-CM | POA: Diagnosis present

## 2021-12-08 DIAGNOSIS — J90 Pleural effusion, not elsewhere classified: Secondary | ICD-10-CM | POA: Diagnosis not present

## 2021-12-08 DIAGNOSIS — Z7902 Long term (current) use of antithrombotics/antiplatelets: Secondary | ICD-10-CM | POA: Diagnosis not present

## 2021-12-08 DIAGNOSIS — G8929 Other chronic pain: Secondary | ICD-10-CM | POA: Diagnosis present

## 2021-12-08 DIAGNOSIS — I69398 Other sequelae of cerebral infarction: Secondary | ICD-10-CM

## 2021-12-08 DIAGNOSIS — Z91048 Other nonmedicinal substance allergy status: Secondary | ICD-10-CM

## 2021-12-08 DIAGNOSIS — R918 Other nonspecific abnormal finding of lung field: Secondary | ICD-10-CM | POA: Diagnosis not present

## 2021-12-08 DIAGNOSIS — Z01818 Encounter for other preprocedural examination: Secondary | ICD-10-CM

## 2021-12-08 DIAGNOSIS — G40909 Epilepsy, unspecified, not intractable, without status epilepticus: Secondary | ICD-10-CM | POA: Diagnosis present

## 2021-12-08 HISTORY — PX: TEE WITHOUT CARDIOVERSION: SHX5443

## 2021-12-08 HISTORY — PX: CORONARY ARTERY BYPASS GRAFT: SHX141

## 2021-12-08 LAB — POCT I-STAT 7, (LYTES, BLD GAS, ICA,H+H)
Acid-Base Excess: 1 mmol/L (ref 0.0–2.0)
Acid-base deficit: 5 mmol/L — ABNORMAL HIGH (ref 0.0–2.0)
Acid-base deficit: 2 mmol/L (ref 0.0–2.0)
Acid-base deficit: 3 mmol/L — ABNORMAL HIGH (ref 0.0–2.0)
Acid-base deficit: 5 mmol/L — ABNORMAL HIGH (ref 0.0–2.0)
Acid-base deficit: 6 mmol/L — ABNORMAL HIGH (ref 0.0–2.0)
Acid-base deficit: 6 mmol/L — ABNORMAL HIGH (ref 0.0–2.0)
Bicarbonate: 21.3 mmol/L (ref 20.0–28.0)
Bicarbonate: 25.3 mmol/L (ref 20.0–28.0)
Bicarbonate: 22.6 mmol/L (ref 20.0–28.0)
Bicarbonate: 22.2 mmol/L (ref 20.0–28.0)
Bicarbonate: 19 mmol/L — ABNORMAL LOW (ref 20.0–28.0)
Bicarbonate: 19.5 mmol/L — ABNORMAL LOW (ref 20.0–28.0)
Bicarbonate: 19.8 mmol/L — ABNORMAL LOW (ref 20.0–28.0)
Calcium, Ion: 1.16 mmol/L (ref 1.15–1.40)
Calcium, Ion: 1 mmol/L — ABNORMAL LOW (ref 1.15–1.40)
Calcium, Ion: 1.08 mmol/L — ABNORMAL LOW (ref 1.15–1.40)
Calcium, Ion: 1.08 mmol/L — ABNORMAL LOW (ref 1.15–1.40)
Calcium, Ion: 1.08 mmol/L — ABNORMAL LOW (ref 1.15–1.40)
Calcium, Ion: 1.17 mmol/L (ref 1.15–1.40)
Calcium, Ion: 1.17 mmol/L (ref 1.15–1.40)
HCT: 34 % — ABNORMAL LOW (ref 39.0–52.0)
HCT: 25 % — ABNORMAL LOW (ref 39.0–52.0)
HCT: 26 % — ABNORMAL LOW (ref 39.0–52.0)
HCT: 24 % — ABNORMAL LOW (ref 39.0–52.0)
HCT: 24 % — ABNORMAL LOW (ref 39.0–52.0)
HCT: 27 % — ABNORMAL LOW (ref 39.0–52.0)
HCT: 26 % — ABNORMAL LOW (ref 39.0–52.0)
Hemoglobin: 11.6 g/dL — ABNORMAL LOW (ref 13.0–17.0)
Hemoglobin: 8.5 g/dL — ABNORMAL LOW (ref 13.0–17.0)
Hemoglobin: 8.8 g/dL — ABNORMAL LOW (ref 13.0–17.0)
Hemoglobin: 8.2 g/dL — ABNORMAL LOW (ref 13.0–17.0)
Hemoglobin: 8.2 g/dL — ABNORMAL LOW (ref 13.0–17.0)
Hemoglobin: 9.2 g/dL — ABNORMAL LOW (ref 13.0–17.0)
Hemoglobin: 8.8 g/dL — ABNORMAL LOW (ref 13.0–17.0)
O2 Saturation: 100 %
O2 Saturation: 100 %
O2 Saturation: 100 %
O2 Saturation: 100 %
O2 Saturation: 100 %
O2 Saturation: 99 %
O2 Saturation: 96 %
Patient temperature: 34.5
Patient temperature: 37
Patient temperature: 37.4
Potassium: 3.2 mmol/L — ABNORMAL LOW (ref 3.5–5.1)
Potassium: 3.5 mmol/L (ref 3.5–5.1)
Potassium: 3.7 mmol/L (ref 3.5–5.1)
Potassium: 3.4 mmol/L — ABNORMAL LOW (ref 3.5–5.1)
Potassium: 3.6 mmol/L (ref 3.5–5.1)
Potassium: 4.1 mmol/L (ref 3.5–5.1)
Potassium: 4 mmol/L (ref 3.5–5.1)
Sodium: 146 mmol/L — ABNORMAL HIGH (ref 135–145)
Sodium: 144 mmol/L (ref 135–145)
Sodium: 142 mmol/L (ref 135–145)
Sodium: 141 mmol/L (ref 135–145)
Sodium: 142 mmol/L (ref 135–145)
Sodium: 143 mmol/L (ref 135–145)
Sodium: 142 mmol/L (ref 135–145)
TCO2: 23 mmol/L (ref 22–32)
TCO2: 26 mmol/L (ref 22–32)
TCO2: 24 mmol/L (ref 22–32)
TCO2: 23 mmol/L (ref 22–32)
TCO2: 20 mmol/L — ABNORMAL LOW (ref 22–32)
TCO2: 21 mmol/L — ABNORMAL LOW (ref 22–32)
TCO2: 21 mmol/L — ABNORMAL LOW (ref 22–32)
pCO2 arterial: 43.3 mmHg (ref 32–48)
pCO2 arterial: 36.5 mmHg (ref 32–48)
pCO2 arterial: 34.7 mmHg (ref 32–48)
pCO2 arterial: 37.1 mmHg (ref 32–48)
pCO2 arterial: 27.2 mmHg — ABNORMAL LOW (ref 32–48)
pCO2 arterial: 35.7 mmHg (ref 32–48)
pCO2 arterial: 39.7 mmHg (ref 32–48)
pH, Arterial: 7.299 — ABNORMAL LOW (ref 7.35–7.45)
pH, Arterial: 7.448 (ref 7.35–7.45)
pH, Arterial: 7.421 (ref 7.35–7.45)
pH, Arterial: 7.385 (ref 7.35–7.45)
pH, Arterial: 7.441 (ref 7.35–7.45)
pH, Arterial: 7.345 — ABNORMAL LOW (ref 7.35–7.45)
pH, Arterial: 7.309 — ABNORMAL LOW (ref 7.35–7.45)
pO2, Arterial: 297 mmHg — ABNORMAL HIGH (ref 83–108)
pO2, Arterial: 429 mmHg — ABNORMAL HIGH (ref 83–108)
pO2, Arterial: 365 mmHg — ABNORMAL HIGH (ref 83–108)
pO2, Arterial: 250 mmHg — ABNORMAL HIGH (ref 83–108)
pO2, Arterial: 149 mmHg — ABNORMAL HIGH (ref 83–108)
pO2, Arterial: 134 mmHg — ABNORMAL HIGH (ref 83–108)
pO2, Arterial: 93 mmHg (ref 83–108)

## 2021-12-08 LAB — POCT I-STAT EG7
Acid-base deficit: 2 mmol/L (ref 0.0–2.0)
Bicarbonate: 22.2 mmol/L (ref 20.0–28.0)
Calcium, Ion: 1.06 mmol/L — ABNORMAL LOW (ref 1.15–1.40)
HCT: 26 % — ABNORMAL LOW (ref 39.0–52.0)
Hemoglobin: 8.8 g/dL — ABNORMAL LOW (ref 13.0–17.0)
O2 Saturation: 78 %
Potassium: 3.4 mmol/L — ABNORMAL LOW (ref 3.5–5.1)
Sodium: 145 mmol/L (ref 135–145)
TCO2: 23 mmol/L (ref 22–32)
pCO2, Ven: 35.5 mmHg — ABNORMAL LOW (ref 44–60)
pH, Ven: 7.405 (ref 7.25–7.43)
pO2, Ven: 42 mmHg (ref 32–45)

## 2021-12-08 LAB — POCT I-STAT, CHEM 8
BUN: 14 mg/dL (ref 8–23)
BUN: 14 mg/dL (ref 8–23)
BUN: 13 mg/dL (ref 8–23)
BUN: 12 mg/dL (ref 8–23)
Calcium, Ion: 1.14 mmol/L — ABNORMAL LOW (ref 1.15–1.40)
Calcium, Ion: 1.12 mmol/L — ABNORMAL LOW (ref 1.15–1.40)
Calcium, Ion: 1.04 mmol/L — ABNORMAL LOW (ref 1.15–1.40)
Calcium, Ion: 1.08 mmol/L — ABNORMAL LOW (ref 1.15–1.40)
Chloride: 111 mmol/L (ref 98–111)
Chloride: 110 mmol/L (ref 98–111)
Chloride: 108 mmol/L (ref 98–111)
Chloride: 106 mmol/L (ref 98–111)
Creatinine, Ser: 0.6 mg/dL — ABNORMAL LOW (ref 0.61–1.24)
Creatinine, Ser: 0.7 mg/dL (ref 0.61–1.24)
Creatinine, Ser: 0.7 mg/dL (ref 0.61–1.24)
Creatinine, Ser: 0.6 mg/dL — ABNORMAL LOW (ref 0.61–1.24)
Glucose, Bld: 111 mg/dL — ABNORMAL HIGH (ref 70–99)
Glucose, Bld: 112 mg/dL — ABNORMAL HIGH (ref 70–99)
Glucose, Bld: 111 mg/dL — ABNORMAL HIGH (ref 70–99)
Glucose, Bld: 86 mg/dL (ref 70–99)
HCT: 36 % — ABNORMAL LOW (ref 39.0–52.0)
HCT: 32 % — ABNORMAL LOW (ref 39.0–52.0)
HCT: 26 % — ABNORMAL LOW (ref 39.0–52.0)
HCT: 23 % — ABNORMAL LOW (ref 39.0–52.0)
Hemoglobin: 12.2 g/dL — ABNORMAL LOW (ref 13.0–17.0)
Hemoglobin: 10.9 g/dL — ABNORMAL LOW (ref 13.0–17.0)
Hemoglobin: 8.8 g/dL — ABNORMAL LOW (ref 13.0–17.0)
Hemoglobin: 7.8 g/dL — ABNORMAL LOW (ref 13.0–17.0)
Potassium: 3.3 mmol/L — ABNORMAL LOW (ref 3.5–5.1)
Potassium: 3.2 mmol/L — ABNORMAL LOW (ref 3.5–5.1)
Potassium: 3.7 mmol/L (ref 3.5–5.1)
Potassium: 3.4 mmol/L — ABNORMAL LOW (ref 3.5–5.1)
Sodium: 147 mmol/L — ABNORMAL HIGH (ref 135–145)
Sodium: 145 mmol/L (ref 135–145)
Sodium: 144 mmol/L (ref 135–145)
Sodium: 141 mmol/L (ref 135–145)
TCO2: 22 mmol/L (ref 22–32)
TCO2: 22 mmol/L (ref 22–32)
TCO2: 26 mmol/L (ref 22–32)
TCO2: 23 mmol/L (ref 22–32)

## 2021-12-08 LAB — GLUCOSE, CAPILLARY
Glucose-Capillary: 111 mg/dL — ABNORMAL HIGH (ref 70–99)
Glucose-Capillary: 136 mg/dL — ABNORMAL HIGH (ref 70–99)
Glucose-Capillary: 140 mg/dL — ABNORMAL HIGH (ref 70–99)
Glucose-Capillary: 127 mg/dL — ABNORMAL HIGH (ref 70–99)
Glucose-Capillary: 104 mg/dL — ABNORMAL HIGH (ref 70–99)
Glucose-Capillary: 111 mg/dL — ABNORMAL HIGH (ref 70–99)
Glucose-Capillary: 137 mg/dL — ABNORMAL HIGH (ref 70–99)
Glucose-Capillary: 115 mg/dL — ABNORMAL HIGH (ref 70–99)

## 2021-12-08 LAB — BASIC METABOLIC PANEL
Anion gap: 7 (ref 5–15)
BUN: 13 mg/dL (ref 8–23)
CO2: 22 mmol/L (ref 22–32)
Calcium: 7.7 mg/dL — ABNORMAL LOW (ref 8.9–10.3)
Chloride: 112 mmol/L — ABNORMAL HIGH (ref 98–111)
Creatinine, Ser: 0.94 mg/dL (ref 0.61–1.24)
GFR, Estimated: >60 mL/min (ref >60)
Glucose, Bld: 118 mg/dL — ABNORMAL HIGH (ref 70–99)
Potassium: 3.8 mmol/L (ref 3.5–5.1)
Sodium: 141 mmol/L (ref 135–145)

## 2021-12-08 LAB — MAGNESIUM: Magnesium: 2.8 mg/dL — ABNORMAL HIGH (ref 1.7–2.4)

## 2021-12-08 LAB — CBC
HCT: 25.7 % — ABNORMAL LOW (ref 39.0–52.0)
HCT: 26.4 % — ABNORMAL LOW (ref 39.0–52.0)
Hemoglobin: 8.3 g/dL — ABNORMAL LOW (ref 13.0–17.0)
Hemoglobin: 8.7 g/dL — ABNORMAL LOW (ref 13.0–17.0)
MCH: 28.7 pg (ref 26.0–34.0)
MCH: 29.3 pg (ref 26.0–34.0)
MCHC: 32.3 g/dL (ref 30.0–36.0)
MCHC: 33 g/dL (ref 30.0–36.0)
MCV: 88.9 fL (ref 80.0–100.0)
MCV: 88.9 fL (ref 80.0–100.0)
Platelets: 90 10*3/uL — ABNORMAL LOW (ref 150–400)
Platelets: 100 10*3/uL — ABNORMAL LOW (ref 150–400)
RBC: 2.89 MIL/uL — ABNORMAL LOW (ref 4.22–5.81)
RBC: 2.97 MIL/uL — ABNORMAL LOW (ref 4.22–5.81)
RDW: 12.7 % (ref 11.5–15.5)
RDW: 12.7 % (ref 11.5–15.5)
WBC: 8.8 10*3/uL (ref 4.0–10.5)
WBC: 9.7 10*3/uL (ref 4.0–10.5)
nRBC: 0 % (ref 0.0–0.2)
nRBC: 0 % (ref 0.0–0.2)

## 2021-12-08 LAB — PLATELET COUNT: Platelets: 80 10*3/uL — ABNORMAL LOW (ref 150–400)

## 2021-12-08 LAB — ABO/RH: ABO/RH(D): O POS

## 2021-12-08 LAB — PROTIME-INR
INR: 1.7 — ABNORMAL HIGH (ref 0.8–1.2)
Prothrombin Time: 19.6 seconds — ABNORMAL HIGH (ref 11.4–15.2)

## 2021-12-08 LAB — APTT: aPTT: 38 seconds — ABNORMAL HIGH (ref 24–36)

## 2021-12-08 LAB — HEMOGLOBIN AND HEMATOCRIT, BLOOD
HCT: 26.6 % — ABNORMAL LOW (ref 39.0–52.0)
Hemoglobin: 8.8 g/dL — ABNORMAL LOW (ref 13.0–17.0)

## 2021-12-08 SURGERY — CORONARY ARTERY BYPASS GRAFTING (CABG)
Anesthesia: General | Site: Esophagus

## 2021-12-08 MED ORDER — MILRINONE LACTATE IN DEXTROSE 20-5 MG/100ML-% IV SOLN
0.1250 ug/kg/min | INTRAVENOUS | Status: DC
  Administered 2021-12-08 (×2): 0.25 ug/kg/min via INTRAVENOUS
  Administered 2021-12-09: 0.125 ug/kg/min via INTRAVENOUS
  Filled 2021-12-08 (×3): qty 100

## 2021-12-08 MED ORDER — TRAMADOL HCL 50 MG PO TABS
50.0000 mg | ORAL_TABLET | ORAL | Status: DC | PRN
  Administered 2021-12-09 – 2021-12-10 (×2): 100 mg via ORAL
  Filled 2021-12-08 (×2): qty 2

## 2021-12-08 MED ORDER — MIDAZOLAM HCL (PF) 5 MG/ML IJ SOLN
INTRAMUSCULAR | Status: DC | PRN
  Administered 2021-12-08: 1 mg via INTRAVENOUS
  Administered 2021-12-08: 2 mg via INTRAVENOUS
  Administered 2021-12-08: 1 mg via INTRAVENOUS
  Administered 2021-12-08: 2 mg via INTRAVENOUS
  Administered 2021-12-08 (×2): 1 mg via INTRAVENOUS
  Administered 2021-12-08: 2 mg via INTRAVENOUS

## 2021-12-08 MED ORDER — TEMAZEPAM 7.5 MG PO CAPS
7.5000 mg | ORAL_CAPSULE | Freq: Every evening | ORAL | Status: DC | PRN
  Administered 2021-12-09 – 2021-12-13 (×3): 7.5 mg via ORAL
  Filled 2021-12-08 (×4): qty 1

## 2021-12-08 MED ORDER — CLONIDINE HCL 0.3 MG/24HR TD PTWK
0.3000 mg | MEDICATED_PATCH | TRANSDERMAL | Status: DC
  Administered 2021-12-08: 0.3 mg via TRANSDERMAL
  Filled 2021-12-08: qty 1

## 2021-12-08 MED ORDER — DOCUSATE SODIUM 100 MG PO CAPS
200.0000 mg | ORAL_CAPSULE | Freq: Every day | ORAL | Status: DC
  Administered 2021-12-09 – 2021-12-10 (×2): 200 mg via ORAL
  Filled 2021-12-08 (×2): qty 2

## 2021-12-08 MED ORDER — MAGNESIUM SULFATE 4 GM/100ML IV SOLN
4.0000 g | Freq: Once | INTRAVENOUS | Status: AC
  Administered 2021-12-08: 4 g via INTRAVENOUS
  Filled 2021-12-08: qty 100

## 2021-12-08 MED ORDER — PROPOFOL 10 MG/ML IV BOLUS
INTRAVENOUS | Status: AC
  Filled 2021-12-08: qty 20

## 2021-12-08 MED ORDER — CHLORHEXIDINE GLUCONATE CLOTH 2 % EX PADS
6.0000 | MEDICATED_PAD | Freq: Every day | CUTANEOUS | Status: DC
  Administered 2021-12-08 – 2021-12-14 (×6): 6 via TOPICAL

## 2021-12-08 MED ORDER — ORAL CARE MOUTH RINSE: 15.0000 mL | Freq: Once | OROMUCOSAL | Status: AC

## 2021-12-08 MED ORDER — ~~LOC~~ CARDIAC SURGERY, PATIENT & FAMILY EDUCATION
Freq: Once | Status: DC
  Filled 2021-12-08: qty 1

## 2021-12-08 MED ORDER — NITROGLYCERIN 0.2 MG/ML ON CALL CATH LAB
INTRAVENOUS | Status: DC | PRN
  Administered 2021-12-08 (×18): 40 ug via INTRAVENOUS

## 2021-12-08 MED ORDER — DORZOLAMIDE HCL-TIMOLOL MAL 2-0.5 % OP SOLN
1.0000 [drp] | Freq: Every day | OPHTHALMIC | Status: DC
  Administered 2021-12-09 – 2021-12-14 (×6): 1 [drp] via OPHTHALMIC
  Filled 2021-12-08: qty 10

## 2021-12-08 MED ORDER — VANCOMYCIN HCL IN DEXTROSE 1-5 GM/200ML-% IV SOLN: 1000.0000 mg | Freq: Once | INTRAVENOUS | Status: DC

## 2021-12-08 MED ORDER — FAMOTIDINE IN NACL 20-0.9 MG/50ML-% IV SOLN
20.0000 mg | Freq: Two times a day (BID) | INTRAVENOUS | Status: DC
  Administered 2021-12-09: 20 mg via INTRAVENOUS
  Filled 2021-12-08: qty 50

## 2021-12-08 MED ORDER — ROCURONIUM BROMIDE 10 MG/ML (PF) SYRINGE
PREFILLED_SYRINGE | INTRAVENOUS | Status: DC | PRN
  Administered 2021-12-08 (×2): 50 mg via INTRAVENOUS
  Administered 2021-12-08: 100 mg via INTRAVENOUS
  Administered 2021-12-08: 30 mg via INTRAVENOUS

## 2021-12-08 MED ORDER — HEPARIN SODIUM (PORCINE) 1000 UNIT/ML IJ SOLN
INTRAMUSCULAR | Status: AC
  Filled 2021-12-08: qty 1

## 2021-12-08 MED ORDER — PHENYLEPHRINE HCL-NACL 20-0.9 MG/250ML-% IV SOLN: 0.0000 ug/min | INTRAVENOUS | Status: DC

## 2021-12-08 MED ORDER — CHLORHEXIDINE GLUCONATE 0.12 % MT SOLN
15.0000 mL | Freq: Once | OROMUCOSAL | Status: AC
  Administered 2021-12-08: 15 mL via OROMUCOSAL
  Filled 2021-12-08: qty 15

## 2021-12-08 MED ORDER — FENTANYL CITRATE (PF) 250 MCG/5ML IJ SOLN
INTRAMUSCULAR | Status: AC
  Filled 2021-12-08: qty 5

## 2021-12-08 MED ORDER — POTASSIUM CHLORIDE 10 MEQ/50ML IV SOLN
10.0000 meq | INTRAVENOUS | Status: AC
  Administered 2021-12-08 (×3): 10 meq via INTRAVENOUS

## 2021-12-08 MED ORDER — FENTANYL CITRATE (PF) 250 MCG/5ML IJ SOLN
INTRAMUSCULAR | Status: DC | PRN
  Administered 2021-12-08 (×2): 100 ug via INTRAVENOUS
  Administered 2021-12-08: 50 ug via INTRAVENOUS
  Administered 2021-12-08: 150 ug via INTRAVENOUS
  Administered 2021-12-08: 100 ug via INTRAVENOUS
  Administered 2021-12-08 (×2): 150 ug via INTRAVENOUS
  Administered 2021-12-08: 100 ug via INTRAVENOUS
  Administered 2021-12-08: 150 ug via INTRAVENOUS

## 2021-12-08 MED ORDER — PHENYLEPHRINE HCL (PRESSORS) 10 MG/ML IV SOLN
INTRAVENOUS | Status: DC | PRN
  Administered 2021-12-08: 60 ug via INTRAVENOUS

## 2021-12-08 MED ORDER — ALBUMIN HUMAN 5 % IV SOLN
250.0000 mL | INTRAVENOUS | Status: AC | PRN
  Administered 2021-12-08 – 2021-12-09 (×3): 12.5 g via INTRAVENOUS
  Filled 2021-12-08: qty 250

## 2021-12-08 MED ORDER — SODIUM CHLORIDE (PF) 0.9 % IJ SOLN
OROMUCOSAL | Status: DC | PRN
  Administered 2021-12-08: 4 mL via TOPICAL
  Administered 2021-12-08: 12 mL via TOPICAL

## 2021-12-08 MED ORDER — HEMOSTATIC AGENTS (NO CHARGE) OPTIME
TOPICAL | Status: DC | PRN
  Administered 2021-12-08 (×2): 1 via TOPICAL

## 2021-12-08 MED ORDER — METOPROLOL TARTRATE 12.5 MG HALF TABLET
12.5000 mg | ORAL_TABLET | Freq: Two times a day (BID) | ORAL | Status: DC
  Administered 2021-12-08 – 2021-12-11 (×4): 12.5 mg via ORAL
  Filled 2021-12-08 (×5): qty 1

## 2021-12-08 MED ORDER — 0.9 % SODIUM CHLORIDE (POUR BTL) OPTIME
TOPICAL | Status: DC | PRN
  Administered 2021-12-08: 5000 mL
  Administered 2021-12-08: 1000 mL

## 2021-12-08 MED ORDER — MIDAZOLAM HCL (PF) 10 MG/2ML IJ SOLN
INTRAMUSCULAR | Status: AC
  Filled 2021-12-08: qty 2

## 2021-12-08 MED ORDER — INSULIN REGULAR(HUMAN) IN NACL 100-0.9 UT/100ML-% IV SOLN: INTRAVENOUS | Status: DC

## 2021-12-08 MED ORDER — HEPARIN SODIUM (PORCINE) 1000 UNIT/ML IJ SOLN
INTRAMUSCULAR | Status: DC | PRN
  Administered 2021-12-08: 31000 [IU] via INTRAVENOUS
  Administered 2021-12-08: 3000 [IU] via INTRAVENOUS
  Administered 2021-12-08: 2000 [IU] via INTRAVENOUS

## 2021-12-08 MED ORDER — MORPHINE SULFATE (PF) 2 MG/ML IV SOLN: 1.0000 mg | INTRAVENOUS | Status: DC | PRN

## 2021-12-08 MED ORDER — PROTAMINE SULFATE 10 MG/ML IV SOLN
INTRAVENOUS | Status: DC | PRN
  Administered 2021-12-08 (×12): 30 mg via INTRAVENOUS

## 2021-12-08 MED ORDER — LACTATED RINGERS IV SOLN: 500.0000 mL | Freq: Once | INTRAVENOUS | Status: DC | PRN

## 2021-12-08 MED ORDER — CARBAMAZEPINE 200 MG PO TABS
200.0000 mg | ORAL_TABLET | Freq: Every day | ORAL | Status: DC
  Administered 2021-12-09 – 2021-12-14 (×6): 200 mg via ORAL
  Filled 2021-12-08 (×6): qty 1

## 2021-12-08 MED ORDER — LACTATED RINGERS IV SOLN: INTRAVENOUS | Status: DC

## 2021-12-08 MED ORDER — DEXMEDETOMIDINE HCL IN NACL 400 MCG/100ML IV SOLN
0.0000 ug/kg/h | INTRAVENOUS | Status: DC
  Administered 2021-12-08: 0.7 ug/kg/h via INTRAVENOUS
  Filled 2021-12-08: qty 100

## 2021-12-08 MED ORDER — ROCURONIUM BROMIDE 10 MG/ML (PF) SYRINGE
PREFILLED_SYRINGE | INTRAVENOUS | Status: AC
  Filled 2021-12-08: qty 10

## 2021-12-08 MED ORDER — NITROGLYCERIN IN D5W 200-5 MCG/ML-% IV SOLN: 0.0000 ug/min | INTRAVENOUS | Status: DC

## 2021-12-08 MED ORDER — BISACODYL 10 MG RE SUPP: 10.0000 mg | Freq: Every day | RECTAL | Status: DC

## 2021-12-08 MED ORDER — LORATADINE 10 MG PO TABS
10.0000 mg | ORAL_TABLET | Freq: Every day | ORAL | Status: DC
  Administered 2021-12-09 – 2021-12-14 (×6): 10 mg via ORAL
  Filled 2021-12-08 (×6): qty 1

## 2021-12-08 MED ORDER — OXYCODONE HCL 5 MG PO TABS
5.0000 mg | ORAL_TABLET | ORAL | Status: DC | PRN
  Administered 2021-12-08 (×2): 10 mg via ORAL
  Administered 2021-12-09 – 2021-12-10 (×2): 5 mg via ORAL
  Filled 2021-12-08 (×2): qty 2
  Filled 2021-12-08 (×2): qty 1

## 2021-12-08 MED ORDER — METOCLOPRAMIDE HCL 5 MG/ML IJ SOLN
10.0000 mg | Freq: Four times a day (QID) | INTRAMUSCULAR | Status: DC
  Administered 2021-12-08 – 2021-12-10 (×7): 10 mg via INTRAVENOUS
  Filled 2021-12-08 (×7): qty 2

## 2021-12-08 MED ORDER — ACETAMINOPHEN 650 MG RE SUPP
650.0000 mg | Freq: Once | RECTAL | Status: AC
  Administered 2021-12-08: 650 mg via RECTAL

## 2021-12-08 MED ORDER — SODIUM CHLORIDE 0.9 % IV SOLN: INTRAVENOUS | Status: DC | PRN

## 2021-12-08 MED ORDER — SODIUM CHLORIDE 0.45 % IV SOLN: INTRAVENOUS | Status: DC | PRN

## 2021-12-08 MED ORDER — ORAL CARE MOUTH RINSE: 15.0000 mL | OROMUCOSAL | Status: DC | PRN

## 2021-12-08 MED ORDER — ONDANSETRON HCL 4 MG/2ML IJ SOLN
INTRAMUSCULAR | Status: DC | PRN
  Administered 2021-12-08: 4 mg via INTRAVENOUS

## 2021-12-08 MED ORDER — SODIUM CHLORIDE 0.9 % IV SOLN: INTRAVENOUS | Status: DC

## 2021-12-08 MED ORDER — PANTOPRAZOLE SODIUM 40 MG PO TBEC
40.0000 mg | DELAYED_RELEASE_TABLET | Freq: Every day | ORAL | Status: DC
  Administered 2021-12-10 – 2021-12-14 (×5): 40 mg via ORAL
  Filled 2021-12-08 (×5): qty 1

## 2021-12-08 MED ORDER — METOPROLOL TARTRATE 25 MG/10 ML ORAL SUSPENSION: 12.5000 mg | Freq: Two times a day (BID) | ORAL | Status: DC

## 2021-12-08 MED ORDER — FLUTICASONE PROPIONATE 50 MCG/ACT NA SUSP
2.0000 | Freq: Every day | NASAL | Status: DC
  Administered 2021-12-09 – 2021-12-14 (×4): 2 via NASAL
  Filled 2021-12-08: qty 16

## 2021-12-08 MED ORDER — VANCOMYCIN HCL IN DEXTROSE 1-5 GM/200ML-% IV SOLN
1000.0000 mg | Freq: Two times a day (BID) | INTRAVENOUS | Status: AC
  Administered 2021-12-08 – 2021-12-09 (×2): 1000 mg via INTRAVENOUS
  Filled 2021-12-08 (×2): qty 200

## 2021-12-08 MED ORDER — ONDANSETRON HCL 4 MG/2ML IJ SOLN
4.0000 mg | Freq: Four times a day (QID) | INTRAMUSCULAR | Status: DC | PRN
  Administered 2021-12-08 – 2021-12-10 (×2): 4 mg via INTRAVENOUS
  Filled 2021-12-08 (×2): qty 2

## 2021-12-08 MED ORDER — LACTATED RINGERS IV SOLN: INTRAVENOUS | Status: DC | PRN

## 2021-12-08 MED ORDER — ASPIRIN 81 MG PO CHEW: 81.0000 mg | CHEWABLE_TABLET | Freq: Every day | ORAL | Status: DC

## 2021-12-08 MED ORDER — CEFAZOLIN SODIUM-DEXTROSE 2-4 GM/100ML-% IV SOLN
2.0000 g | Freq: Three times a day (TID) | INTRAVENOUS | Status: AC
  Administered 2021-12-08 – 2021-12-10 (×6): 2 g via INTRAVENOUS
  Filled 2021-12-08 (×6): qty 100

## 2021-12-08 MED ORDER — CHLORHEXIDINE GLUCONATE 0.12 % MT SOLN
15.0000 mL | OROMUCOSAL | Status: AC
  Administered 2021-12-08: 15 mL via OROMUCOSAL
  Filled 2021-12-08: qty 15

## 2021-12-08 MED ORDER — ACETAMINOPHEN 500 MG PO TABS
1000.0000 mg | ORAL_TABLET | Freq: Four times a day (QID) | ORAL | Status: AC
  Administered 2021-12-08 – 2021-12-13 (×18): 1000 mg via ORAL
  Filled 2021-12-08 (×20): qty 2

## 2021-12-08 MED ORDER — DEXTROSE 50 % IV SOLN: 0.0000 mL | INTRAVENOUS | Status: DC | PRN

## 2021-12-08 MED ORDER — SURGIFLO WITH THROMBIN (HEMOSTATIC MATRIX KIT) OPTIME
TOPICAL | Status: DC | PRN
  Administered 2021-12-08 (×2): 1 via TOPICAL

## 2021-12-08 MED ORDER — SODIUM CHLORIDE 0.9% IV SOLUTION: Freq: Once | INTRAVENOUS | Status: DC

## 2021-12-08 MED ORDER — ACETAMINOPHEN 160 MG/5ML PO SOLN: 650.0000 mg | Freq: Once | ORAL | Status: AC

## 2021-12-08 MED ORDER — ASPIRIN 81 MG PO TBEC
81.0000 mg | DELAYED_RELEASE_TABLET | Freq: Every day | ORAL | Status: DC
  Administered 2021-12-09 – 2021-12-14 (×6): 81 mg via ORAL
  Filled 2021-12-08 (×6): qty 1

## 2021-12-08 MED ORDER — ACETAMINOPHEN 160 MG/5ML PO SOLN: 1000.0000 mg | Freq: Four times a day (QID) | ORAL | Status: AC

## 2021-12-08 MED ORDER — SODIUM CHLORIDE 0.9% FLUSH: 3.0000 mL | INTRAVENOUS | Status: DC | PRN

## 2021-12-08 MED ORDER — ORAL CARE MOUTH RINSE
15.0000 mL | OROMUCOSAL | Status: DC
  Administered 2021-12-08: 15 mL via OROMUCOSAL

## 2021-12-08 MED ORDER — SODIUM BICARBONATE 8.4 % IV SOLN
50.0000 meq | Freq: Once | INTRAVENOUS | Status: AC
  Administered 2021-12-08: 50 meq via INTRAVENOUS

## 2021-12-08 MED ORDER — ALBUMIN HUMAN 5 % IV SOLN: INTRAVENOUS | Status: DC | PRN

## 2021-12-08 MED ORDER — CHLORHEXIDINE GLUCONATE 0.12 % MT SOLN: 15.0000 mL | Freq: Once | OROMUCOSAL | Status: DC

## 2021-12-08 MED ORDER — HEPARIN SODIUM (PORCINE) 1000 UNIT/ML IJ SOLN
INTRAMUSCULAR | Status: AC
  Filled 2021-12-08: qty 10

## 2021-12-08 MED ORDER — PROTAMINE SULFATE 10 MG/ML IV SOLN
INTRAVENOUS | Status: AC
  Filled 2021-12-08: qty 50

## 2021-12-08 MED ORDER — SODIUM CHLORIDE 0.9 % IV SOLN: 250.0000 mL | INTRAVENOUS | Status: DC

## 2021-12-08 MED ORDER — BISACODYL 5 MG PO TBEC
10.0000 mg | DELAYED_RELEASE_TABLET | Freq: Every day | ORAL | Status: DC
  Administered 2021-12-09 – 2021-12-10 (×2): 10 mg via ORAL
  Filled 2021-12-08 (×4): qty 2

## 2021-12-08 MED ORDER — CHLORHEXIDINE GLUCONATE 4 % EX LIQD: 30.0000 mL | CUTANEOUS | Status: DC

## 2021-12-08 MED ORDER — SODIUM CHLORIDE 0.9% FLUSH
3.0000 mL | Freq: Two times a day (BID) | INTRAVENOUS | Status: DC
  Administered 2021-12-09 – 2021-12-14 (×10): 3 mL via INTRAVENOUS

## 2021-12-08 MED ORDER — ONDANSETRON HCL 4 MG/2ML IJ SOLN
INTRAMUSCULAR | Status: AC
  Filled 2021-12-08: qty 2

## 2021-12-08 MED ORDER — PLASMA-LYTE A IV SOLN: INTRAVENOUS | Status: DC | PRN

## 2021-12-08 MED ORDER — PROPOFOL 10 MG/ML IV BOLUS
INTRAVENOUS | Status: DC | PRN
  Administered 2021-12-08: 60 mg via INTRAVENOUS
  Administered 2021-12-08: 40 mg via INTRAVENOUS
  Administered 2021-12-08: 50 mg via INTRAVENOUS

## 2021-12-08 MED ORDER — ROSUVASTATIN CALCIUM 20 MG PO TABS
40.0000 mg | ORAL_TABLET | Freq: Every day | ORAL | Status: DC
  Administered 2021-12-09 – 2021-12-14 (×6): 40 mg via ORAL
  Filled 2021-12-08 (×6): qty 2

## 2021-12-08 MED ORDER — NITROGLYCERIN IN D5W 200-5 MCG/ML-% IV SOLN
INTRAVENOUS | Status: DC | PRN
  Administered 2021-12-08: 16.6 ug/min via INTRAVENOUS

## 2021-12-08 MED ORDER — MIDAZOLAM HCL 2 MG/2ML IJ SOLN: 2.0000 mg | INTRAMUSCULAR | Status: DC | PRN

## 2021-12-08 MED ORDER — METOPROLOL TARTRATE 5 MG/5ML IV SOLN
2.5000 mg | INTRAVENOUS | Status: DC | PRN
  Administered 2021-12-08: 5 mg via INTRAVENOUS
  Filled 2021-12-08: qty 5

## 2021-12-08 MED ORDER — METOPROLOL TARTRATE 12.5 MG HALF TABLET: 12.5000 mg | ORAL_TABLET | Freq: Once | ORAL | Status: DC

## 2021-12-08 MED ORDER — LATANOPROST 0.005 % OP SOLN
1.0000 [drp] | Freq: Every day | OPHTHALMIC | Status: DC
  Administered 2021-12-08 – 2021-12-13 (×6): 1 [drp] via OPHTHALMIC
  Filled 2021-12-08: qty 2.5

## 2021-12-08 MED ORDER — HEMOSTATIC AGENTS (NO CHARGE) OPTIME
TOPICAL | Status: DC | PRN
  Administered 2021-12-08: 1 via TOPICAL

## 2021-12-08 MED ORDER — LABETALOL HCL 5 MG/ML IV SOLN: 10.0000 mg | INTRAVENOUS | Status: DC | PRN

## 2021-12-08 SURGICAL SUPPLY — 104 items
ADAPTER CARDIO PERF ANTE/RETRO (ADAPTER) ×2 IMPLANT
BAG DECANTER FOR FLEXI CONT (MISCELLANEOUS) ×2 IMPLANT
BLADE CLIPPER SURG (BLADE) ×2 IMPLANT
BLADE STERNUM SYSTEM 6 (BLADE) ×2 IMPLANT
BLADE SURG 12 STRL SS (BLADE) ×2 IMPLANT
BNDG ELASTIC 4X5.8 VLCR STR LF (GAUZE/BANDAGES/DRESSINGS) ×2 IMPLANT
BNDG ELASTIC 6X5.8 VLCR STR LF (GAUZE/BANDAGES/DRESSINGS) ×2 IMPLANT
BNDG GAUZE DERMACEA FLUFF 4 (GAUZE/BANDAGES/DRESSINGS) ×2 IMPLANT
CANISTER SUCT 3000ML PPV (MISCELLANEOUS) ×2 IMPLANT
CANNULA AORTIC ROOT 9FR (CANNULA) IMPLANT
CANNULA ARTERIAL NVNT 3/8 20FR (MISCELLANEOUS) IMPLANT
CANNULA ARTERIAL NVNT 3/8 22FR (MISCELLANEOUS) IMPLANT
CANNULA CARDIOPLEGIA 15FR (CANNULA) IMPLANT
CANNULA GUNDRY RCSP 15FR (MISCELLANEOUS) ×2 IMPLANT
CANNULA MC2 2 STG 36/46 NON-V (CANNULA) IMPLANT
CANNULA VENOUS 2 STG 34/46 (CANNULA) ×2
CATH CPB KIT VANTRIGT (MISCELLANEOUS) ×2 IMPLANT
CATH ROBINSON RED A/P 18FR (CATHETERS) ×6 IMPLANT
CATH THORACIC 28FR RT ANG (CATHETERS) ×2 IMPLANT
CLIP TI WIDE RED SMALL 24 (CLIP) IMPLANT
COVER MAYO STAND STRL (DRAPES) IMPLANT
DERMABOND ADVANCED .7 DNX12 (GAUZE/BANDAGES/DRESSINGS) IMPLANT
DRAIN CHANNEL 32F RND 10.7 FF (WOUND CARE) ×2 IMPLANT
DRAPE CARDIOVASCULAR INCISE (DRAPES) ×2
DRAPE SLUSH/WARMER DISC (DRAPES) ×2 IMPLANT
DRAPE SRG 135X102X78XABS (DRAPES) ×2 IMPLANT
DRSG AQUACEL AG ADV 3.5X14 (GAUZE/BANDAGES/DRESSINGS) ×2 IMPLANT
ELECT BLADE 4.0 EZ CLEAN MEGAD (MISCELLANEOUS) ×2
ELECT CAUTERY BLADE 6.4 (BLADE) ×2 IMPLANT
ELECT REM PT RETURN 9FT ADLT (ELECTROSURGICAL) ×4
ELECTRODE BLDE 4.0 EZ CLN MEGD (MISCELLANEOUS) ×2 IMPLANT
ELECTRODE REM PT RTRN 9FT ADLT (ELECTROSURGICAL) ×4 IMPLANT
FELT TEFLON 1X6 (MISCELLANEOUS) ×4 IMPLANT
GLOVE BIO SURGEON STRL SZ 6.5 (GLOVE) IMPLANT
GLOVE BIO SURGEON STRL SZ7 (GLOVE) IMPLANT
GLOVE BIO SURGEON STRL SZ7.5 (GLOVE) ×6 IMPLANT
GLOVE BIOGEL PI IND STRL 6.5 (GLOVE) IMPLANT
GLOVE SS BIOGEL STRL SZ 6 (GLOVE) IMPLANT
GOWN STRL REUS W/ TWL LRG LVL3 (GOWN DISPOSABLE) ×8 IMPLANT
GOWN STRL REUS W/TWL LRG LVL3 (GOWN DISPOSABLE) ×18
HEMOSTAT POWDER SURGIFOAM 1G (HEMOSTASIS) ×6 IMPLANT
HEMOSTAT SURGICEL 2X14 (HEMOSTASIS) ×2 IMPLANT
INSERT FOGARTY XLG (MISCELLANEOUS) IMPLANT
KIT BASIN OR (CUSTOM PROCEDURE TRAY) ×2 IMPLANT
KIT SUCTION CATH 14FR (SUCTIONS) ×2 IMPLANT
KIT TURNOVER KIT B (KITS) ×2 IMPLANT
KIT VASOVIEW HEMOPRO 2 VH 4000 (KITS) ×2 IMPLANT
LEAD PACING MYOCARDI (MISCELLANEOUS) ×2 IMPLANT
MARKER GRAFT CORONARY BYPASS (MISCELLANEOUS) ×6 IMPLANT
NS IRRIG 1000ML POUR BTL (IV SOLUTION) ×10 IMPLANT
PACK E OPEN HEART (SUTURE) ×2 IMPLANT
PACK OPEN HEART (CUSTOM PROCEDURE TRAY) ×2 IMPLANT
PAD ARMBOARD 7.5X6 YLW CONV (MISCELLANEOUS) ×4 IMPLANT
PAD ELECT DEFIB RADIOL ZOLL (MISCELLANEOUS) ×2 IMPLANT
PENCIL BUTTON HOLSTER BLD 10FT (ELECTRODE) ×2 IMPLANT
POSITIONER HEAD DONUT 9IN (MISCELLANEOUS) ×2 IMPLANT
POWDER SURGICEL 3.0 GRAM (HEMOSTASIS) ×2 IMPLANT
PUNCH AORTIC ROTATE 4.0MM (MISCELLANEOUS) IMPLANT
PUNCH AORTIC ROTATE 4.5MM 8IN (MISCELLANEOUS) IMPLANT
PUNCH AORTIC ROTATE 5MM 8IN (MISCELLANEOUS) IMPLANT
SET CARDIO DLP MULTI-PER 4-LEG (CARDIAC RHYTHM DISPOSABLE) IMPLANT
SET CARDIOPLEGIA MPS 5001102 (MISCELLANEOUS) IMPLANT
SPONGE T-LAP 18X18 ~~LOC~~+RFID (SPONGE) ×8 IMPLANT
SPONGE T-LAP 4X18 ~~LOC~~+RFID (SPONGE) ×2 IMPLANT
SUPPORT HEART JANKE-BARRON (MISCELLANEOUS) ×2 IMPLANT
SURGIFLO W/THROMBIN 8M KIT (HEMOSTASIS) ×2 IMPLANT
SUT BONE WAX W31G (SUTURE) ×2 IMPLANT
SUT MNCRL AB 4-0 PS2 18 (SUTURE) IMPLANT
SUT PROLENE 3 0 SH DA (SUTURE) IMPLANT
SUT PROLENE 3 0 SH1 36 (SUTURE) IMPLANT
SUT PROLENE 4 0 RB 1 (SUTURE) ×6
SUT PROLENE 4 0 SH DA (SUTURE) ×2 IMPLANT
SUT PROLENE 4-0 RB1 .5 CRCL 36 (SUTURE) ×2 IMPLANT
SUT PROLENE 5 0 C 1 36 (SUTURE) IMPLANT
SUT PROLENE 6 0 C 1 30 (SUTURE) IMPLANT
SUT PROLENE 6 0 CC (SUTURE) ×6 IMPLANT
SUT PROLENE 8 0 BV175 6 (SUTURE) IMPLANT
SUT PROLENE BLUE 7 0 (SUTURE) ×2 IMPLANT
SUT PROLENE POLY MONO (SUTURE) IMPLANT
SUT SILK  1 MH (SUTURE)
SUT SILK 1 MH (SUTURE) IMPLANT
SUT SILK 2 0 SH CR/8 (SUTURE) IMPLANT
SUT SILK 3 0 SH CR/8 (SUTURE) IMPLANT
SUT STEEL 6MS V (SUTURE) ×4 IMPLANT
SUT STEEL STERNAL CCS#1 18IN (SUTURE) IMPLANT
SUT STEEL SZ 6 DBL 3X14 BALL (SUTURE) ×2 IMPLANT
SUT VIC AB 1 CTX 36 (SUTURE) ×8
SUT VIC AB 1 CTX36XBRD ANBCTR (SUTURE) ×4 IMPLANT
SUT VIC AB 2-0 CT1 27 (SUTURE) ×2
SUT VIC AB 2-0 CT1 TAPERPNT 27 (SUTURE) IMPLANT
SUT VIC AB 2-0 CTX 27 (SUTURE) IMPLANT
SUT VIC AB 3-0 X1 27 (SUTURE) IMPLANT
SUT VIC AB 4-0 PS2 18 (SUTURE) IMPLANT
SYSTEM SAHARA CHEST DRAIN ATS (WOUND CARE) ×2 IMPLANT
TAPE CLOTH SURG 4X10 WHT LF (GAUZE/BANDAGES/DRESSINGS) IMPLANT
TAPE PAPER 2X10 WHT MICROPORE (GAUZE/BANDAGES/DRESSINGS) IMPLANT
TOWEL GREEN STERILE (TOWEL DISPOSABLE) ×2 IMPLANT
TOWEL GREEN STERILE FF (TOWEL DISPOSABLE) ×2 IMPLANT
TRAY FOLEY SLVR 16FR TEMP STAT (SET/KITS/TRAYS/PACK) ×2 IMPLANT
TUBE SUCT INTRACARD DLP 20F (MISCELLANEOUS) IMPLANT
TUBE SUCTION CARDIAC 10FR (CANNULA) IMPLANT
TUBING LAP HI FLOW INSUFFLATIO (TUBING) ×2 IMPLANT
UNDERPAD 30X36 HEAVY ABSORB (UNDERPADS AND DIAPERS) ×2 IMPLANT
WATER STERILE IRR 1000ML POUR (IV SOLUTION) ×4 IMPLANT

## 2021-12-08 NOTE — Anesthesia Procedure Notes (Addendum)
Arterial Line Insertion Start/End11/29/2023 7:15 AM, 12/08/2021 7:18 AM Performed by: Heide Scales, CRNA, CRNA  Patient location: Pre-op. Preanesthetic checklist: patient identified, IV checked, site marked, risks and benefits discussed, surgical consent, monitors and equipment checked, pre-op evaluation, timeout performed and anesthesia consent Lidocaine 1% used for infiltration Left, radial was placed Catheter size: 20 G Hand hygiene performed , maximum sterile barriers used  and Seldinger technique used Allen's test indicative of satisfactory collateral circulation Attempts: 1 Procedure performed without using ultrasound guided technique. Following insertion, dressing applied and Biopatch. Post procedure assessment: normal and unchanged  Patient tolerated the procedure well with no immediate complications.
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
## 2021-12-08 NOTE — Progress Notes (Signed)
EVENING ROUNDS NOTE :     [address]       [place] [address]             [phone]                 Day of Surgery Procedure(s) (LRB): CORONARY ARTERY BYPASS GRAFTING (CABG) X TWO BYPASSES USING OPEN LEFT INTERNAL MAMMARY ARTERY AND ENDOSCOPIC RIGHT GREATER SAPHENOUS VEIN HARVEST. (N/A) TRANSESOPHAGEAL ECHOCARDIOGRAM (TEE) (N/A)   Total Length of Stay:  LOS: 0 days  Events:   No events On milr Good HD     BP (!) 113/47   Pulse 72   Temp 98.2 F (36.8 C)   Resp 14   Ht '6\' 2"'$  (1.88 m)   Wt 100.7 kg   SpO2 100%   BMI 28.50 kg/m      Vent Mode: SIMV;PRVC;PSV FiO2 (%):  [50 %] 50 % Set Rate:  [12 bmp] 12 bmp Vt Set:  [730 mL-820 mL] 730 mL PEEP:  [5 cmH20] 5 cmH20 Pressure Support:  [10 cmH20] 10 cmH20 Plateau Pressure:  [21 cmH20] 21 cmH20   sodium chloride     [START ON 12/09/2021] sodium chloride     sodium chloride     albumin human 12.5 g (12/08/21 1545)    ceFAZolin (ANCEF) IV 2 g (12/08/21 1541)   dexmedetomidine (PRECEDEX) IV infusion 0.7 mcg/kg/hr (12/08/21 1537)   famotidine (PEPCID) IV     insulin     lactated ringers     lactated ringers     lactated ringers 10 mL/hr at 12/08/21 1537   magnesium sulfate 4 g (12/08/21 1545)   milrinone 0.25 mcg/kg/min (12/08/21 1539)   nitroGLYCERIN     phenylephrine (NEO-SYNEPHRINE) Adult infusion     potassium chloride     vancomycin      No intake/output data recorded.      Latest Ref Rng & Units 12/08/2021    1:00 PM 12/08/2021   12:55 PM 12/08/2021   12:08 PM  CBC  Hemoglobin 13.0 - 17.0 g/dL 7.8  8.2  8.8   Hematocrit 39.0 - 52.0 % 23.0  24.0  26.0        Latest Ref Rng & Units 12/08/2021    1:00 PM 12/08/2021   12:55 PM 12/08/2021   12:08 PM  BMP  Glucose 70 - 99 mg/dL 86     BUN 8 - 23 mg/dL 12     Creatinine 0.61 - 1.24 mg/dL 0.60     Sodium 135 - 145 mmol/L 141  141  142   Potassium 3.5 - 5.1 mmol/L 3.4  3.4  3.7   Chloride 98 - 111 mmol/L 106       ABG     Component Value Date/Time   PHART 7.385 12/08/2021 1255   PCO2ART 37.1 12/08/2021 1255   PO2ART 250 (H) 12/08/2021 1255   HCO3 22.2 12/08/2021 1255   TCO2 23 12/08/2021 1300   ACIDBASEDEF 3.0 (H) 12/08/2021 1255   O2SAT 100 12/08/2021 1255       Melodie Bouillon, MD 12/08/2021 3:48 PM

## 2021-12-08 NOTE — Op Note (Unsigned)
NAME: Andrew [name], Andrew [name] MEDICAL RECORD NO: [id number] ACCOUNT NO: [id number] DATE OF BIRTH: 23-Dec-1950 FACILITY: [facility] LOCATION: [facility] PHYSICIAN: Ivin Poot III, MD  Operative Report   DATE OF PROCEDURE: 12/08/2021  OPERATIONS PERFORMED:  1.  Coronary artery bypass grafting x 2 (left internal mammary artery to LAD, saphenous vein graft to ramus intermediate). 2.  Endoscopic harvest of right leg greater saphenous vein.  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  SURGEON:  Ivin Poot, M.D.  ASSISTANT:  Lars Pinks, PA-C.  A surgical first assistant was needed for the surgery due to its complexity and to provide the standard of surgical care for heart surgery at this institution.  The first assistant was needed to endoscopically harvest the vein conduit and close the leg  incision.  The first assistant was needed to assist with the distal coronary anastomoses with suture management, suctioning, exposure and general assistance.  ANESTHESIA:  General by Dr. Lendon Ka.  DESCRIPTION OF PROCEDURE:  The patient was examined in preoperative holding where informed consent was documented and final issues were addressed and questions responded to since his initial consultation in the office.  The patient was then brought  directly to the operating room by anesthesia and placed supine on the operating table.  Under invasive hemodynamic monitoring, he was carefully induced for general anesthesia and intubated and a transesophageal echo probe was placed.  The patient was  then positioned and a Foley catheter was placed.  The patient was prepped and draped as a sterile field.  A proper timeout was performed.  A sternal incision was made and the saphenous vein was harvested endoscopically from the right leg.  The left  internal  mammary artery was harvested as a pedicle graft from its origin at the subclavian vessels.  It was a 1.5 mm vessel with excellent flow.  The vein was of adequate quality.  The sternal retractor was placed using the deep blades because of the  patient's obese body habitus.  The pericardium was opened and suspended.  Pursestrings were placed in the ascending aorta and right atrium and after heparin was administered, the patient was cannulated and placed on cardiopulmonary bypass.  The  coronaries were identified for grafting.  The LAD was heavily diseased and thickened with a proximal 95% stenosis.  This was grafted distally.  The ramus intermediate had a proximal 60% stenosis.  It was intramyocardial and was an adequate target.  The  distal circumflex was occluded and distally, the vessels were too small to graft.  The right coronary was a small nondominant vessel with an atretic distal vessel, too small to graft.  Cardioplegia cannulas were placed for both antegrade and retrograde  cold blood cardioplegia and the patient was cooled to 32 degrees.  The aortic crossclamp was applied and 1 liter of cold blood cardioplegia was delivered in split doses between the antegrade aortic and retrograde coronary sinus catheters.  There was good  cardioplegic arrest and supple temperature dropped less than 14 degrees.  Cardioplegia was delivered every 20 minutes.  The distal coronary anastomoses were performed.  The first distal anastomosis was to the ramus intermediate branch of the left coronary.  A reverse saphenous vein was sewn end-to-side with running 7-0 Prolene.  There was good flow through the graft.  Cardioplegia was redosed.  The second distal anastomosis was the distal third of the LAD.  Proximally,  the vessel was 90% stenotic.  The left IMA pedicle was brought through an opening in the left lateral pericardium and it was brought down onto the LAD.  The LAD was very  thickened, but was a 1.5 mm vessel and  a 1.5 mm probe was passed proximally to the high-grade stenosis.  The anastomosis was performed with a running 8-0 Prolene.  There was good flow through the anastomosis after briefly releasing the pedicle bulldog  and the mammary artery.  The bulldog was reapplied and the pedicle was secured to epicardium with 6-0 Prolenes.  Cardioplegia was redosed.  While the cross clamp was still in place, the proximal vein anastomosis was performed using a 4.5 mm punch and running 6-0 Prolene.  Prior to tying down the final proximal anastomosis, air was vented from the coronaries using a dose of retrograde warm  blood cardioplegia.  The crossclamp was then removed.  The vein graft was de-aired and opened and had good flow and hemostasis was documented at the proximal and distal sites.  The mammary artery anastomosis was hemostatic.  The patient was rewarmed and reperfused.  The cardioplegia cannulas were removed.   Temporary pacing wires were applied.  The lungs were expanded and ventilator was resumed.  The patient was weaned from cardiopulmonary bypass without difficulty with only a low dose milrinone.  Echo showed preserved LV function.  Protamine was  administered without adverse reaction.  The platelet count returned less than 80,000 and the patient was given 1 unit of platelets to help with hemostasis.  The superior pericardial fat was closed over the aorta and vein graft.  An anterior mediastinal  and left pleural tube were placed and brought out through separate incisions.  The sternum was closed with interrupted steel wire.  The pectoralis fascia was closed with a running #1 Vicryl.  The subcutaneous and skin layers were closed with running  Vicryl and sterile dressings were applied.  The patient returned to the ICU in a stable condition.  Total cardiopulmonary bypass time was 100 minutes.   MUK D: 12/08/2021 6:01:29 pm T: 12/08/2021 10:20:00 pm  JOB: [id number]/ [id number]
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
## 2021-12-08 NOTE — Anesthesia Procedure Notes (Addendum)
Procedure Name: Intubation Date/Time: 12/08/2021 8:24 AM  Performed by: Heide Scales, CRNAPre-anesthesia Checklist: Patient identified, Emergency Drugs available, Suction available and Patient being monitored Patient Re-evaluated:Patient Re-evaluated prior to induction Oxygen Delivery Method: Circle system utilized Preoxygenation: Pre-oxygenation with 100% oxygen Induction Type: IV induction Ventilation: Mask ventilation without difficulty and Oral airway inserted - appropriate to patient size Laryngoscope Size: Mac and 4 Grade View: Grade II Tube type: Oral Tube size: 8.0 mm Number of attempts: 1 Airway Equipment and Method: Stylet and Oral airway Placement Confirmation: ETT inserted through vocal cords under direct vision, positive ETCO2 and breath sounds checked- equal and bilateral Secured at: 23 cm Tube secured with: Tape Dental Injury: Teeth and Oropharynx as per pre-operative assessment
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
## 2021-12-08 NOTE — Discharge Summary (Addendum)
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 (L) 4.22 - 5.81 MIL/uL   Hemoglobin 7.9 (L) 13.0 - 17.0 g/dL   HCT 24.1 (L) 39.0 - 52.0 %   MCV 88.3 80.0 - 100.0 fL   MCH 28.9 26.0 - 34.0 pg   MCHC 32.8 30.0 - 36.0 g/dL   RDW 13.3 11.5 - 15.5 %   Platelets 128 (L) 150 - 400 K/uL   nRBC 0.0 0.0 - 0.2 %    Comment: Performed at [facility], [place] [address], [place], [place] [address]  Glucose, capillary     Status: None   Collection Time: 12/13/21  6:41 AM  Result Value Ref Range   Glucose-Capillary 88 70 - 99 mg/dL    Comment: Glucose reference range applies only to samples taken after fasting for at least 8 hours.  Glucose, capillary     Status: None   Collection Time: 12/13/21 10:35 AM  Result Value Ref Range   Glucose-Capillary 95 70 - 99 mg/dL    Comment: Glucose reference range applies only to samples taken after fasting for at least 8 hours.   Glucose, capillary     Status: None   Collection Time: 12/13/21  1:44 PM  Result Value Ref Range   Glucose-Capillary 83 70 - 99 mg/dL    Comment: Glucose reference range applies only to samples taken after fasting for at least 8 hours.  Glucose, capillary     Status: Abnormal   Collection Time: 12/13/21  4:45 PM  Result Value Ref Range   Glucose-Capillary 50 (L) 70 - 99 mg/dL    Comment: Glucose reference range applies only to samples taken after fasting for at least 8 hours.  Glucose, capillary     Status: Abnormal   Collection Time: 12/13/21  9:22 PM  Result Value Ref Range   Glucose-Capillary 109 (H) 70 - 99 mg/dL    Comment: Glucose reference range applies only to samples taken after fasting for at least 8 hours.  Basic metabolic panel     Status: Abnormal   Collection Time: 12/14/21  2:14 AM  Result Value Ref Range   Sodium 142 135 - 145 mmol/L   Potassium  4.0 3.5 - 5.1 mmol/L   Chloride 107 98 - 111 mmol/L   CO2 25 22 - 32 mmol/L   Glucose, Bld 98 70 - 99 mg/dL    Comment: Glucose reference range applies only to samples taken after fasting for at least 8 hours.   BUN 14 8 - 23 mg/dL   Creatinine, Ser 1.08 0.61 - 1.24 mg/dL   Calcium 8.6 (L) 8.9 - 10.3 mg/dL   GFR, Estimated >60 >60 mL/min    Comment: (NOTE) Calculated using the CKD-EPI Creatinine Equation (2021)    Anion gap 10 5 - 15    Comment: Performed at [facility] [address] [place], [place] [address]  CBC     Status: Abnormal   Collection Time: 12/14/21  2:14 AM  Result Value Ref Range   WBC 7.3 4.0 - 10.5 K/uL   RBC 3.13 (L) 4.22 - 5.81 MIL/uL   Hemoglobin 9.2 (L) 13.0 - 17.0 g/dL   HCT 28.1 (L) 39.0 - 52.0 %   MCV 89.8 80.0 - 100.0 fL   MCH 29.4 26.0 - 34.0 pg   MCHC 32.7 30.0 - 36.0 g/dL   RDW 13.2 11.5 - 15.5 %   Platelets 169 150 - 400 K/uL   nRBC 0.0 0.0 - 0.2 %    Comment: Performed at [facility], [place] [address] [place], [place] [address]  Glucose, capillary     Status: None    Collection Time: 12/14/21  6:44 AM  Result Value Ref Range   Glucose-Capillary 91 70 - 99 mg/dL    Comment: Glucose reference range applies only to samples taken after fasting for at least 8 hours.    Treatments: surgery:    Operative Report    DATE OF PROCEDURE: 12/08/2021   OPERATIONS PERFORMED:  1.  Coronary artery bypass grafting x 2 (left internal mammary artery to LAD, saphenous vein graft to ramus intermediate). 2.  Endoscopic harvest of right leg greater saphenous vein.   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   SURGEON:  Ivin Poot, M.D.   ASSISTANT:  Lars Pinks, PA-C.  Discharge Exam: Blood pressure (!) 147/75, pulse 83, temperature 99.1 F (37.3 C), temperature source Oral, resp. rate 20, height _0  (1.88 m), weight 101.2 kg, SpO2 99 %.    General appearance: alert, cooperative, and no distress Heart: regular rate and rhythm Lungs: mildly dim in bases Abdomen: benign Extremities: minor ankle edema Wound: incis healing well Discharge Medications:  The patient has been discharged on:   1.Beta Blocker:  Yes [  y ]                              No   [   ]                              If No, reason:  2.Ace Inhibitor/ARB: Yes [ y  ]                                     No  [    ]  If No, reason:  3.Statin:   Yes [  y ]                  No  [   ]                  If No, reason:  4.Ecasa:  Yes  [   y]                  No   [   ]                  If No, reason:  Patient had ACS upon admission:n  Plavix/P2Y12 inhibitor: Yes [ y  ]                                      No  [   ]     Discharge Instructions     Amb Referral to Cardiac Rehabilitation   Complete by: As directed    Diagnosis: CABG   CABG X ___: 2   After initial evaluation and  assessments completed: Virtual Based Care may be provided alone or in conjunction with Phase 2 Cardiac Rehab based on patient barriers.: Yes   Intensive Cardiac Rehabilitation (ICR) [place] location only OR Traditional Cardiac Rehabilitation (TCR) *If criteria for ICR are not met will enroll in TCR [facility] only): Yes   Discharge patient   Complete by: As directed    Discharge disposition: 01-Home or Self Care   Discharge patient date: 12/14/2021      Allergies as of 12/14/2021       Reactions   Adhesive [tape] Rash   Latex Rash   Aspirin Other (See Comments)   unknown reaction   Ether Other (See Comments)   unknown reaction   Hydrocodone Other (See Comments)   unknown reaction   Lexapro [escitalopram Oxalate] Other (See Comments)   Pt does not recall why this is listed as an allergy, cannot recall an interaction he has experienced from taking this medication.    Other Other (See Comments)   SSRI'S - unknown reaction        Medication List     STOP taking these medications    naproxen sodium 220 MG tablet Commonly known as: ALEVE   NexIUM 24HR 20 MG Tbec Generic drug: Esomeprazole Magnesium   pioglitazone 15 MG tablet Commonly known as: ACTOS   temazepam 7.5 MG capsule Commonly known as: Restoril       TAKE these medications    amLODipine 10 MG tablet Commonly known as: NORVASC TAKE 1 TABLET(10 MG) BY MOUTH DAILY   ARY NASAL MIST ALLERGY/SINUS NA Place 1 spray into the nose daily as needed (congestion).   aspirin EC 81 MG tablet Take 1 tablet (81 mg total) by mouth daily. Swallow whole.   Blood Pressure Monitor/L Cuff Misc To monitor blood pressure daily/ has elevated blood pressure readings on medication for hypertension   carbamazepine 200 MG tablet Commonly known as: TEGRETOL TAKE 1 TABLET(200 MG) BY MOUTH DAILY   carvedilol 6.25 MG tablet Commonly known as: COREG Take 1 tablet (6.25 mg total) by mouth 2 (two) times daily with a meal. What changed:   medication strength See the new instructions.   cetirizine 10 MG tablet Commonly known as: ZYRTEC Take 10 mg by mouth daily.   cloNIDine 0.3 mg/24hr patch Commonly known as: CATAPRES -  Dosed in mg/24 hr Place 1 patch (0.3 mg total) onto the skin once a week.   clopidogrel 75 MG tablet Commonly known as: PLAVIX Take 1 tablet (75 mg total) by mouth daily. What changed: See the new instructions.   dorzolamide-timolol 2-0.5 % ophthalmic solution Commonly known as: COSOPT Place 1 drop into the left eye daily.   Farxiga 10 MG Tabs tablet Generic drug: dapagliflozin propanediol Take 10 mg by mouth daily before breakfast.   Fe Fum-Vit C-Vit B12-FA Caps capsule Commonly known as: TRIGELS-F FORTE Take 1 capsule by mouth daily after breakfast.   fluticasone 50 MCG/ACT nasal spray Commonly known as: FLONASE SHAKE LIQUID AND USE 2 SPRAYS IN EACH NOSTRIL DAILY What changed: See the new instructions.   glucose blood test strip Commonly known as: Estate manager/land agent 1 each by Other route daily.   latanoprost 0.005 % ophthalmic solution Commonly known as: XALATAN Place 1 drop into both eyes at bedtime.   olmesartan 40 MG tablet Commonly known as: BENICAR TAKE 1 TABLET(40 MG) BY MOUTH DAILY   oxyCODONE 5 MG immediate release tablet Commonly known as: Oxy IR/ROXICODONE Take 1 tablet (5 mg total) by mouth every 6 (six) hours as needed for up to 7 days for severe pain.   pantoprazole 40 MG tablet Commonly known as: PROTONIX Take 1 tablet (40 mg total) by mouth daily.   rosuvastatin 40 MG tablet Commonly known as: CRESTOR Take 1 tablet (40 mg total) by mouth daily.   sitaGLIPtin 100 MG tablet Commonly known as: Januvia Take 0.5 tablets (50 mg total) by mouth daily.        Follow-up Information     [name], MD. Go on 01/11/2022.   Specialty: Cardiothoracic Surgery Why: PA/LAT CXR to be taken (at [facility] which is in the same building as Dr. Lucianne Lei Trigt's  office on the ground floor) on 01/02 at 12:15 pm. Appointment time is at 1:00 pm Contact information: [place] [address] [phone]         Early Osmond, MD Follow up.   Specialty: Cardiology Contact information: [address] [place] [address] [place]. Follow up.   Why: Latricia Heft)- preop referral from [place] office for [facility] needs- they will contact you to schedule Contact information: [place] [address] [phone]                 Signed:  John Giovanni, PA-C  12/14/2021, 10:15 AM   Discharge instructions reviewed with patient  patient examined and medical record reviewed,agree with above note. [name] 12/14/2021

## 2021-12-08 NOTE — Anesthesia Procedure Notes (Signed)
Central Venous Catheter Insertion Performed by: Oleta Mouse, MD, anesthesiologist Start/End11/29/2023 7:22 AM, 12/08/2021 7:35 AM Patient location: Pre-op. Preanesthetic checklist: patient identified, IV checked, risks and benefits discussed, surgical consent, monitors and equipment checked, pre-op evaluation, timeout performed and anesthesia consent Position: supine Lidocaine 1% used for infiltration and patient sedated Hand hygiene performed , maximum sterile barriers used  and Seldinger technique used Catheter size: 9 Fr Total catheter length 10. Central line was placed.MAC introducer Swan type:thermodilution Procedure performed using ultrasound guided technique. Ultrasound Notes:anatomy identified, needle tip was noted to be adjacent to the nerve/plexus identified, no ultrasound evidence of intravascular and/or intraneural injection and image(s) printed for medical record Attempts: 1 Following insertion, line sutured, dressing applied and Biopatch. Post procedure assessment: blood return through all ports, free fluid flow and no air  Patient tolerated the procedure well with no immediate complications.

## 2021-12-08 NOTE — Anesthesia Procedure Notes (Signed)
Central Venous Catheter Insertion Performed by: Oleta Mouse, MD, anesthesiologist Start/End11/29/2023 7:22 AM, 12/08/2021 7:35 AM Patient location: Pre-op. Preanesthetic checklist: patient identified, IV checked, risks and benefits discussed, surgical consent, monitors and equipment checked, pre-op evaluation, timeout performed and anesthesia consent Hand hygiene performed  and maximum sterile barriers used  PA cath was placed.Swan type:thermodilution Procedure performed without using ultrasound guided technique. Attempts: 1 Patient tolerated the procedure well with no immediate complications.

## 2021-12-08 NOTE — Transfer of Care (Signed)
Immediate Anesthesia Transfer of Care Note  Patient: Andrew [name]  Procedure(s) Performed: CORONARY ARTERY BYPASS GRAFTING (CABG) X TWO BYPASSES USING OPEN LEFT INTERNAL MAMMARY ARTERY AND ENDOSCOPIC RIGHT GREATER SAPHENOUS VEIN HARVEST. (Chest) TRANSESOPHAGEAL ECHOCARDIOGRAM (TEE) (Esophagus)  Patient Location: ICU  Anesthesia Type:General  Level of Consciousness: sedated, unresponsive, and Patient remains intubated per anesthesia plan  Airway & Oxygen Therapy: Patient remains intubated per anesthesia plan and Patient placed on Ventilator (see vital sign flow sheet for setting)  Post-op Assessment: Report given to RN and Post -op Vital signs reviewed and stable  Post vital signs: Reviewed and stable  Last Vitals:  Vitals Value Taken Time  BP 113/47 12/08/21 1500  Temp 34.5 C 12/08/21 1504  Pulse 72 12/08/21 1503  Resp 19 12/08/21 1504  SpO2 100 % 12/08/21 1503  Vitals shown include unvalidated device data.  Last Pain:  Vitals:   12/08/21 0617  PainSc: 0-No pain      Patients Stated Pain Goal: 0 (74/12/87 8676)  Complications: No notable events documented.

## 2021-12-08 NOTE — Procedures (Signed)
Extubation Procedure Note  Patient Details:   Name: Andrew [name] DOB: 04-06-1950 MRN: [id number]   Airway Documentation:    Vent end date: 12/08/21 Vent end time: 1904   Evaluation  O2 sats: stable throughout Complications: No apparent complications Patient did tolerate procedure well. Bilateral Breath Sounds: Clear, Diminished   Yes  Positive cuff leak noted. NIF - 30, VC 940 mL. Patient placed on [place] 2L with humidity, no stridor noted. Patient able to reach 625m using the incentive spirometer.  JMingo AmberPalmer 12/08/2021, 7:28 PM

## 2021-12-08 NOTE — Brief Op Note (Addendum)
12/08/2021  12:02 PM  PATIENT:  Andrew [name]  71 y.o. male  PRE-OPERATIVE DIAGNOSIS:  Coronary artery disease  POST-OPERATIVE DIAGNOSIS:  Coronary artery disease  PROCEDURE:  TRANSESOPHAGEAL ECHOCARDIOGRAM (TEE), CORONARY ARTERY BYPASS GRAFTING (CABG) X 2 (LIMA to LAD and SVG to RAMUS INTERMEDIATE) USING OPEN LEFT INTERNAL MAMMARY ARTERY AND ENDOSCOPIC RIGHT GREATER SAPHENOUS VEIN HARVEST.   LIMA to LAD SVG to Ramus intermediate RCA non dominant and atretic- not adequate  target Distal circumflex atretic and too small to graft c/w  diabetic disease Vein harvest time: 20 min Vein prep time: 10 min  SURGEON:  Surgeon(s) and Role:    Ivin Poot, MD - Primary  PHYSICIAN ASSISTANT: Lars Pinks PA-C  ASSISTANTS: Ara Kussmaul RNFA   ANESTHESIA:   general  EBL:  300 cc  DRAINS:  Chest tubes placed in the mediastinal and pleural spaces    COUNTS CORRECT:  YES  DICTATION: .Dragon Dictation  PLAN OF CARE: Admit to inpatient   PATIENT DISPOSITION:  ICU - intubated and hemodynamically stable.   Delay start of Pharmacological VTE agent (>24hrs) due to surgical blood loss or risk of bleeding: no   BASELINE WEIGHT: 100.7 kg

## 2021-12-09 ENCOUNTER — Inpatient Hospital Stay (HOSPITAL_COMMUNITY): Payer: Medicare Other

## 2021-12-09 ENCOUNTER — Encounter (HOSPITAL_COMMUNITY): Payer: Self-pay | Admitting: Cardiothoracic Surgery

## 2021-12-09 LAB — BASIC METABOLIC PANEL
Anion gap: 8 (ref 5–15)
Anion gap: 6 (ref 5–15)
BUN: 13 mg/dL (ref 8–23)
BUN: 18 mg/dL (ref 8–23)
CO2: 21 mmol/L — ABNORMAL LOW (ref 22–32)
CO2: 23 mmol/L (ref 22–32)
Calcium: 8.1 mg/dL — ABNORMAL LOW (ref 8.9–10.3)
Calcium: 8.1 mg/dL — ABNORMAL LOW (ref 8.9–10.3)
Chloride: 112 mmol/L — ABNORMAL HIGH (ref 98–111)
Chloride: 112 mmol/L — ABNORMAL HIGH (ref 98–111)
Creatinine, Ser: 1.13 mg/dL (ref 0.61–1.24)
Creatinine, Ser: 1.27 mg/dL — ABNORMAL HIGH (ref 0.61–1.24)
GFR, Estimated: >60 mL/min (ref >60)
GFR, Estimated: >60 mL/min (ref >60)
Glucose, Bld: 109 mg/dL — ABNORMAL HIGH (ref 70–99)
Glucose, Bld: 149 mg/dL — ABNORMAL HIGH (ref 70–99)
Potassium: 3.8 mmol/L (ref 3.5–5.1)
Potassium: 4 mmol/L (ref 3.5–5.1)
Sodium: 141 mmol/L (ref 135–145)
Sodium: 141 mmol/L (ref 135–145)

## 2021-12-09 LAB — GLUCOSE, CAPILLARY
Glucose-Capillary: 101 mg/dL — ABNORMAL HIGH (ref 70–99)
Glucose-Capillary: 105 mg/dL — ABNORMAL HIGH (ref 70–99)
Glucose-Capillary: 109 mg/dL — ABNORMAL HIGH (ref 70–99)
Glucose-Capillary: 111 mg/dL — ABNORMAL HIGH (ref 70–99)
Glucose-Capillary: 111 mg/dL — ABNORMAL HIGH (ref 70–99)
Glucose-Capillary: 116 mg/dL — ABNORMAL HIGH (ref 70–99)
Glucose-Capillary: 116 mg/dL — ABNORMAL HIGH (ref 70–99)
Glucose-Capillary: 124 mg/dL — ABNORMAL HIGH (ref 70–99)
Glucose-Capillary: 125 mg/dL — ABNORMAL HIGH (ref 70–99)
Glucose-Capillary: 126 mg/dL — ABNORMAL HIGH (ref 70–99)
Glucose-Capillary: 127 mg/dL — ABNORMAL HIGH (ref 70–99)
Glucose-Capillary: 129 mg/dL — ABNORMAL HIGH (ref 70–99)
Glucose-Capillary: 132 mg/dL — ABNORMAL HIGH (ref 70–99)

## 2021-12-09 LAB — BPAM PLATELET PHERESIS
Blood Product Expiration Date: 202311302359
ISSUE DATE / TIME: 202311291222
Unit Type and Rh: 7300

## 2021-12-09 LAB — COOXEMETRY PANEL
Carboxyhemoglobin: 2.5 % — ABNORMAL HIGH (ref 0.5–1.5)
Methemoglobin: 1 % (ref 0.0–1.5)
O2 Saturation: 67.8 %
Total hemoglobin: 8.7 g/dL — ABNORMAL LOW (ref 12.0–16.0)

## 2021-12-09 LAB — CBC
HCT: 25.5 % — ABNORMAL LOW (ref 39.0–52.0)
HCT: 27 % — ABNORMAL LOW (ref 39.0–52.0)
Hemoglobin: 8.3 g/dL — ABNORMAL LOW (ref 13.0–17.0)
Hemoglobin: 8.5 g/dL — ABNORMAL LOW (ref 13.0–17.0)
MCH: 29.1 pg (ref 26.0–34.0)
MCH: 28.4 pg (ref 26.0–34.0)
MCHC: 32.5 g/dL (ref 30.0–36.0)
MCHC: 31.5 g/dL (ref 30.0–36.0)
MCV: 89.5 fL (ref 80.0–100.0)
MCV: 90.3 fL (ref 80.0–100.0)
Platelets: 107 10*3/uL — ABNORMAL LOW (ref 150–400)
Platelets: 102 10*3/uL — ABNORMAL LOW (ref 150–400)
RBC: 2.85 MIL/uL — ABNORMAL LOW (ref 4.22–5.81)
RBC: 2.99 MIL/uL — ABNORMAL LOW (ref 4.22–5.81)
RDW: 12.8 % (ref 11.5–15.5)
RDW: 13.2 % (ref 11.5–15.5)
WBC: 13 10*3/uL — ABNORMAL HIGH (ref 4.0–10.5)
WBC: 15.6 10*3/uL — ABNORMAL HIGH (ref 4.0–10.5)
nRBC: 0 % (ref 0.0–0.2)
nRBC: 0 % (ref 0.0–0.2)

## 2021-12-09 LAB — PREPARE PLATELET PHERESIS: Unit division: 0

## 2021-12-09 LAB — MAGNESIUM
Magnesium: 2.5 mg/dL — ABNORMAL HIGH (ref 1.7–2.4)
Magnesium: 2.6 mg/dL — ABNORMAL HIGH (ref 1.7–2.4)

## 2021-12-09 MED ORDER — ALBUMIN HUMAN 5 % IV SOLN: 250.0000 mL | Freq: Every day | INTRAVENOUS | Status: DC | PRN

## 2021-12-09 MED ORDER — ENOXAPARIN SODIUM 40 MG/0.4ML IJ SOSY
40.0000 mg | PREFILLED_SYRINGE | Freq: Every day | INTRAMUSCULAR | Status: DC
  Administered 2021-12-09 – 2021-12-12 (×4): 40 mg via SUBCUTANEOUS
  Filled 2021-12-09 (×4): qty 0.4

## 2021-12-09 MED ORDER — INSULIN DETEMIR 100 UNIT/ML ~~LOC~~ SOLN
10.0000 [IU] | Freq: Two times a day (BID) | SUBCUTANEOUS | Status: DC
  Administered 2021-12-09 – 2021-12-12 (×8): 10 [IU] via SUBCUTANEOUS
  Filled 2021-12-09 (×10): qty 0.1

## 2021-12-09 MED ORDER — MIDAZOLAM HCL 2 MG/2ML IJ SOLN: 1.0000 mg | INTRAMUSCULAR | Status: DC | PRN

## 2021-12-09 MED ORDER — MUPIROCIN CALCIUM 2 % EX CREA
TOPICAL_CREAM | Freq: Two times a day (BID) | CUTANEOUS | Status: DC
  Administered 2021-12-12 – 2021-12-13 (×2): 1 via TOPICAL
  Filled 2021-12-09 (×2): qty 15

## 2021-12-09 MED ORDER — FUROSEMIDE 10 MG/ML IJ SOLN
40.0000 mg | INTRAMUSCULAR | Status: AC
  Administered 2021-12-09: 40 mg via INTRAVENOUS
  Filled 2021-12-09: qty 4

## 2021-12-09 MED ORDER — MORPHINE SULFATE (PF) 2 MG/ML IV SOLN: 1.0000 mg | INTRAVENOUS | Status: DC | PRN

## 2021-12-09 MED ORDER — INSULIN ASPART 100 UNIT/ML IJ SOLN
0.0000 [IU] | INTRAMUSCULAR | Status: DC
  Administered 2021-12-09 – 2021-12-10 (×5): 2 [IU] via SUBCUTANEOUS

## 2021-12-09 MED ORDER — SODIUM BICARBONATE 8.4 % IV SOLN
50.0000 meq | Freq: Once | INTRAVENOUS | Status: AC
  Administered 2021-12-09: 50 meq via INTRAVENOUS
  Filled 2021-12-09: qty 50

## 2021-12-09 NOTE — Progress Notes (Addendum)
T[facility] DAILY ICU PROGRESS NOTE                   [address]            [place] [address]          [phone]   1 Day Post-Op Procedure(s) (LRB): CORONARY ARTERY BYPASS GRAFTING (CABG) X TWO BYPASSES USING OPEN LEFT INTERNAL MAMMARY ARTERY AND ENDOSCOPIC RIGHT GREATER SAPHENOUS VEIN HARVEST. (N/A) TRANSESOPHAGEAL ECHOCARDIOGRAM (TEE) (N/A)  Total Length of Stay:  LOS: 1 day   Subjective: Minor pain, feels overall well  Objective: Vital signs in last 24 hours: Temp:  [94.1 F (34.5 C)-100.6 F (38.1 C)] 99.7 F (37.6 C) (11/30 0700) Pulse Rate:  [70-90] 73 (11/30 0700) Cardiac Rhythm: Normal sinus rhythm (11/29 1930) Resp:  [7-24] 19 (11/30 0700) BP: (90-151)/(47-78) 99/55 (11/30 0700) SpO2:  [94 %-100 %] 96 % (11/30 0700) Arterial Line BP: (117-194)/(41-67) 136/44 (11/30 0700) FiO2 (%):  [40 %-50 %] 40 % (11/29 1802) Weight:  [107.3 kg] 107.3 kg (11/30 0500)  Filed Weights   12/08/21 0635 12/09/21 0500  Weight: 100.7 kg 107.3 kg    Weight change: 6.602 kg   Hemodynamic parameters for last 24 hours: PAP: (17-40)/(6-23) 31/12 CO:  [4.2 L/min-8.6 L/min] 8.6 L/min CI:  [1.9 L/min/m2-3.8 L/min/m2] 3.8 L/min/m2  Intake/Output from previous day: 11/29 0701 - 11/30 0700 In: 6975.2 [P.O.:200; I.V.:3717.9; Blood:516; IV Piggyback:2541.3] Out: 3160 [Urine:2250; Emesis/NG output:150; Blood:500; Chest Tube:260]  Intake/Output this shift: No intake/output data recorded.  Current Meds: Scheduled Meds:  sodium chloride   Intravenous Once   acetaminophen  1,000 mg Oral Q6H   Or   acetaminophen (TYLENOL) oral liquid 160 mg/5 mL  1,000 mg Per Tube Q6H   aspirin EC  81 mg Oral Daily   Or   aspirin  81 mg Per Tube Daily   bisacodyl  10 mg Oral Daily   Or   bisacodyl  10 mg Rectal Daily   carbamazepine  200 mg Oral Daily   Chlorhexidine Gluconate Cloth  6 each Topical Daily   cloNIDine  0.3 mg Transdermal Weekly   docusate sodium  200 mg Oral Daily    dorzolamide-timolol  1 drop Left Eye Daily   fluticasone  2 spray Each Nare Daily   latanoprost  1 drop Both Eyes QHS   loratadine  10 mg Oral Daily   metoCLOPramide (REGLAN) injection  10 mg Intravenous Q6H   metoprolol tartrate  12.5 mg Oral BID   Or   metoprolol tartrate  12.5 mg Per Tube BID   [START ON 12/10/2021] pantoprazole  40 mg Oral Daily   rosuvastatin  40 mg Oral Daily   sodium chloride flush  3 mL Intravenous Q12H   Continuous Infusions:  sodium chloride 20 mL/hr at 12/09/21 0700   sodium chloride     sodium chloride     albumin human Stopped (12/09/21 0408)    ceFAZolin (ANCEF) IV Stopped (12/09/21 0536)   dexmedetomidine (PRECEDEX) IV infusion 0.7 mcg/kg/hr (12/09/21 0100)   famotidine (PEPCID) IV     insulin 0.4 Units/hr (12/09/21 0700)   lactated ringers     lactated ringers     lactated ringers 10 mL/hr at 12/09/21 0700   milrinone 0.25 mcg/kg/min (12/09/21 0700)   nitroGLYCERIN Stopped (12/09/21 0219)   phenylephrine (NEO-SYNEPHRINE) Adult infusion 35 mcg/min (12/09/21 0700)   vancomycin Stopped (12/08/21 2025)   PRN Meds:.sodium chloride, albumin human, dextrose, labetalol, lactated ringers, metoprolol tartrate,  midazolam, morphine injection, ondansetron (ZOFRAN) IV, mouth rinse, oxyCODONE, sodium chloride flush, temazepam, traMADol  General appearance: alert, cooperative, and no distress Heart: regular rate and rhythm Lungs: clear to auscultation bilaterally Abdomen: benign Extremities: no signif edema Wound: dressings CDI  Lab Results: CBC: Recent Labs    12/08/21 2131 12/09/21 0315  WBC 9.7 13.0*  HGB 8.7* 8.3*  HCT 26.4* 25.5*  PLT 100* 107*   BMET:  Recent Labs    12/08/21 2131 12/09/21 0315  NA 141 141  K 3.8 3.8  CL 112* 112*  CO2 22 21*  GLUCOSE 118* 109*  BUN 13 13  CREATININE 0.94 1.13  CALCIUM 7.7* 8.1*    CMET: Lab Results  Component Value Date   WBC 13.0 (H) 12/09/2021   HGB 8.3 (L) 12/09/2021   HCT 25.5 (L)  12/09/2021   PLT 107 (L) 12/09/2021   GLUCOSE 109 (H) 12/09/2021   CHOL 172 02/04/2021   TRIG 94.0 02/04/2021   HDL 50.10 02/04/2021   LDLCALC 103 (H) 02/04/2021   ALT 17 12/06/2021   AST 19 12/06/2021   NA 141 12/09/2021   K 3.8 12/09/2021   CL 112 (H) 12/09/2021   CREATININE 1.13 12/09/2021   BUN 13 12/09/2021   CO2 21 (L) 12/09/2021   TSH 1.05 03/05/2021   PSA 3.49 01/14/2019   INR 1.7 (H) 12/08/2021   HGBA1C 6.2 (H) 12/06/2021   MICROALBUR 1.1 09/30/2021      PT/INR:  Recent Labs    12/08/21 1532  LABPROT 19.6*  INR 1.7*   Radiology: [facility] Chest Port 1 View  Result Date: 12/08/2021 CLINICAL DATA:  Incorrect needle count.  Pneumothorax. EXAM: PORTABLE CHEST 1 VIEW COMPARISON:  12/06/2021 FINDINGS: Endotracheal tube tip 6 cm above the carina. Endoscope device projects at the level of the diaphragm. Swan-Ganz catheter tip in the right main pulmonary artery. Left chest tubes in place. Previous median sternotomy and CABG. No unexpected radiodensity. Moderate volume loss left lower lobe. IMPRESSION: Lines and tubes well positioned. No unexpected radiodensity. Moderate volume loss left lower lobe. Report called to operating room. Electronically Signed   By: Nelson Chimes M.D.   On: 12/08/2021 14:36     Assessment/Plan: S/P Procedure(s) (LRB): CORONARY ARTERY BYPASS GRAFTING (CABG) X TWO BYPASSES USING OPEN LEFT INTERNAL MAMMARY ARTERY AND ENDOSCOPIC RIGHT GREATER SAPHENOUS VEIN HARVEST. (N/A) TRANSESOPHAGEAL ECHOCARDIOGRAM (TEE) (N/A) POD#1  1  Tmax 100.6, s BP 117-194 on a line, good hemodynamics on neo and .25 milrinone, sinus rhythm 2 O2 sats good on RA 3 excellent UOP, weight about 7 kg >preop 4 CT 260/24h, prob remove today after mobilized some 5 excellent BS control 6 normal renal fxn 7 mild reactive leukocytosis, monitor clinically 8 expected ABLA , moderate, monitor clinically, not at transfusion threshold 9 reactive thrombocytopenia, improving trend 10 CXR minor  atx 11 routine POD#1 progression    Gaspar Bidding Pager [phone] 12/09/2021 7:15 AM  Wean neo and mobilize Patient was using asa 81 mg preop w/o untoward effects  patient examined and medical record reviewed,agree with above note. [name] 12/09/2021

## 2021-12-09 NOTE — Progress Notes (Signed)
     [address]       [place] [address]             [phone]       EVENING ROUNDS  No issues for the day Still on Milrinone Urine output 300cc so far for the day Order lasix

## 2021-12-10 ENCOUNTER — Inpatient Hospital Stay (HOSPITAL_COMMUNITY): Payer: Medicare Other

## 2021-12-10 LAB — CBC
HCT: 27 % — ABNORMAL LOW (ref 39.0–52.0)
Hemoglobin: 8.6 g/dL — ABNORMAL LOW (ref 13.0–17.0)
MCH: 28.9 pg (ref 26.0–34.0)
MCHC: 31.9 g/dL (ref 30.0–36.0)
MCV: 90.6 fL (ref 80.0–100.0)
Platelets: 98 10*3/uL — ABNORMAL LOW (ref 150–400)
RBC: 2.98 MIL/uL — ABNORMAL LOW (ref 4.22–5.81)
RDW: 13.2 % (ref 11.5–15.5)
WBC: 15.2 10*3/uL — ABNORMAL HIGH (ref 4.0–10.5)
nRBC: 0 % (ref 0.0–0.2)

## 2021-12-10 LAB — GLUCOSE, CAPILLARY
Glucose-Capillary: 95 mg/dL (ref 70–99)
Glucose-Capillary: 125 mg/dL — ABNORMAL HIGH (ref 70–99)
Glucose-Capillary: 147 mg/dL — ABNORMAL HIGH (ref 70–99)
Glucose-Capillary: 109 mg/dL — ABNORMAL HIGH (ref 70–99)
Glucose-Capillary: 153 mg/dL — ABNORMAL HIGH (ref 70–99)
Glucose-Capillary: 156 mg/dL — ABNORMAL HIGH (ref 70–99)

## 2021-12-10 LAB — BASIC METABOLIC PANEL
Anion gap: 9 (ref 5–15)
BUN: 19 mg/dL (ref 8–23)
CO2: 26 mmol/L (ref 22–32)
Calcium: 8.4 mg/dL — ABNORMAL LOW (ref 8.9–10.3)
Chloride: 106 mmol/L (ref 98–111)
Creatinine, Ser: 1.3 mg/dL — ABNORMAL HIGH (ref 0.61–1.24)
GFR, Estimated: 59 mL/min — ABNORMAL LOW (ref >60)
Glucose, Bld: 112 mg/dL — ABNORMAL HIGH (ref 70–99)
Potassium: 3.8 mmol/L (ref 3.5–5.1)
Sodium: 141 mmol/L (ref 135–145)

## 2021-12-10 LAB — URINALYSIS, COMPLETE (UACMP) WITH MICROSCOPIC
Bacteria, UA: NONE SEEN
Bilirubin Urine: NEGATIVE
Glucose, UA: NEGATIVE mg/dL
Ketones, ur: NEGATIVE mg/dL
Leukocytes,Ua: NEGATIVE
Nitrite: NEGATIVE
Protein, ur: NEGATIVE mg/dL
Specific Gravity, Urine: 1.008 (ref 1.005–1.030)
pH: 5 (ref 5.0–8.0)

## 2021-12-10 LAB — COOXEMETRY PANEL
Carboxyhemoglobin: 2.3 % — ABNORMAL HIGH (ref 0.5–1.5)
Methemoglobin: <0.7 % (ref 0.0–1.5)
O2 Saturation: 60.7 %
Total hemoglobin: 9.3 g/dL — ABNORMAL LOW (ref 12.0–16.0)

## 2021-12-10 MED ORDER — CEFUROXIME SODIUM 1.5 G IV SOLR: 1.5000 g | Freq: Two times a day (BID) | INTRAVENOUS | Status: DC

## 2021-12-10 MED ORDER — INSULIN ASPART 100 UNIT/ML IJ SOLN
0.0000 [IU] | Freq: Three times a day (TID) | INTRAMUSCULAR | Status: DC
  Administered 2021-12-11 – 2021-12-12 (×3): 2 [IU] via SUBCUTANEOUS

## 2021-12-10 MED ORDER — FUROSEMIDE 10 MG/ML IJ SOLN
40.0000 mg | Freq: Every day | INTRAMUSCULAR | Status: DC
  Administered 2021-12-10 – 2021-12-11 (×2): 40 mg via INTRAVENOUS
  Filled 2021-12-10 (×2): qty 4

## 2021-12-10 MED ORDER — INSULIN ASPART 100 UNIT/ML IJ SOLN: 0.0000 [IU] | Freq: Every day | INTRAMUSCULAR | Status: DC

## 2021-12-10 MED ORDER — METOCLOPRAMIDE HCL 5 MG/ML IJ SOLN
10.0000 mg | Freq: Three times a day (TID) | INTRAMUSCULAR | Status: DC
  Administered 2021-12-10 – 2021-12-11 (×3): 10 mg via INTRAVENOUS
  Filled 2021-12-10 (×3): qty 2

## 2021-12-10 MED ORDER — SODIUM CHLORIDE 0.9 % IV SOLN
1.5000 g | Freq: Two times a day (BID) | INTRAVENOUS | Status: DC
  Administered 2021-12-10 – 2021-12-11 (×3): 1.5 g via INTRAVENOUS
  Filled 2021-12-10 (×4): qty 1.5

## 2021-12-10 MED ORDER — FE FUM-VIT C-VIT B12-FA 460-60-0.01-1 MG PO CAPS
1.0000 | ORAL_CAPSULE | Freq: Every day | ORAL | Status: DC
  Administered 2021-12-10 – 2021-12-14 (×5): 1 via ORAL
  Filled 2021-12-10 (×5): qty 1

## 2021-12-10 NOTE — Progress Notes (Signed)
CT surgery PM rounds  Patient did well today, drains removed and patient ambulated.  Some balance issues with ambulation so physical therapy has been requested Blood sugars remain in range No elevated temperature this evening Foley catheter out but has not yet voided  Blood pressure (!) 115/104, pulse 77, temperature 98.8 F (37.1 C), temperature source Oral, resp. rate 19, height '6\' 2"'$  (1.88 m), weight 106.9 kg, SpO2 96 %.

## 2021-12-10 NOTE — Progress Notes (Addendum)
[facility] DAILY ICU PROGRESS NOTE                   [address]            [place] [address]          [phone]   2 Days Post-Op Procedure(s) (LRB): CORONARY ARTERY BYPASS GRAFTING (CABG) X TWO BYPASSES USING OPEN LEFT INTERNAL MAMMARY ARTERY AND ENDOSCOPIC RIGHT GREATER SAPHENOUS VEIN HARVEST. (N/A) TRANSESOPHAGEAL ECHOCARDIOGRAM (TEE) (N/A)  Total Length of Stay:  LOS: 2 days   Subjective: Fees ok  Objective: Vital signs in last 24 hours: Temp:  [98.5 F (36.9 C)-100.9 F (38.3 C)] 98.5 F (36.9 C) (12/01 0400) Pulse Rate:  [79-83] 83 (12/01 0600) Cardiac Rhythm: Atrial paced (11/30 2029) Resp:  [12-27] 24 (12/01 0600) BP: (104-141)/(57-86) 132/67 (12/01 0600) SpO2:  [92 %-99 %] 92 % (12/01 0600) Arterial Line BP: (143-175)/(42-60) 165/47 (11/30 2000) Weight:  [106.9 kg] 106.9 kg (12/01 0500)  Filed Weights   12/08/21 0635 12/09/21 0500 12/10/21 0500  Weight: 100.7 kg 107.3 kg 106.9 kg    Weight change: -0.4 kg   Hemodynamic parameters for last 24 hours: PAP: (30-33)/(13-15) 33/15 CO:  [5.5 L/min] 5.5 L/min CI:  [2.4 L/min/m2] 2.4 L/min/m2  Intake/Output from previous day: 11/30 0701 - 12/01 0700 In: 814.3 [I.V.:264.3; IV Piggyback:550] Out: 7209 [Urine:850; Chest Tube:220]  Intake/Output this shift: No intake/output data recorded.  Current Meds: Scheduled Meds:  sodium chloride   Intravenous Once   acetaminophen  1,000 mg Oral Q6H   Or   acetaminophen (TYLENOL) oral liquid 160 mg/5 mL  1,000 mg Per Tube Q6H   aspirin EC  81 mg Oral Daily   Or   aspirin  81 mg Per Tube Daily   bisacodyl  10 mg Oral Daily   Or   bisacodyl  10 mg Rectal Daily   carbamazepine  200 mg Oral Daily   Chlorhexidine Gluconate Cloth  6 each Topical Daily   cloNIDine  0.3 mg Transdermal Weekly   docusate sodium  200 mg Oral Daily   dorzolamide-timolol  1 drop Left Eye Daily   enoxaparin (LOVENOX) injection  40 mg Subcutaneous QHS   Fe Fum-Vit C-Vit B12-FA  1  capsule Oral QPC breakfast   fluticasone  2 spray Each Nare Daily   furosemide  40 mg Intravenous Daily   insulin detemir  10 Units Subcutaneous BID   latanoprost  1 drop Both Eyes QHS   loratadine  10 mg Oral Daily   metoCLOPramide (REGLAN) injection  10 mg Intravenous Q8H   metoprolol tartrate  12.5 mg Oral BID   Or   metoprolol tartrate  12.5 mg Per Tube BID   mupirocin cream   Topical BID   pantoprazole  40 mg Oral Daily   rosuvastatin  40 mg Oral Daily   sodium chloride flush  3 mL Intravenous Q12H   Continuous Infusions:  sodium chloride Stopped (12/09/21 0816)   sodium chloride     sodium chloride     albumin human     lactated ringers     lactated ringers Stopped (12/09/21 1337)   nitroGLYCERIN Stopped (12/09/21 0219)   phenylephrine (NEO-SYNEPHRINE) Adult infusion Stopped (12/09/21 1034)   PRN Meds:.sodium chloride, albumin human, dextrose, labetalol, metoprolol tartrate, midazolam, morphine injection, ondansetron (ZOFRAN) IV, mouth rinse, oxyCODONE, sodium chloride flush, temazepam, traMADol  General appearance: alert, cooperative, and no distress Heart: regular rate and rhythm Lungs: clear to auscultation bilaterally Abdomen:  benign Extremities: trace edema Wound: dressings intact  Lab Results: CBC: Recent Labs    12/09/21 1720 12/10/21 0512  WBC 15.6* 15.2*  HGB 8.5* 8.6*  HCT 27.0* 27.0*  PLT 102* 98*   BMET:  Recent Labs    12/09/21 1720 12/10/21 0512  NA 141 141  K 4.0 3.8  CL 112* 106  CO2 23 26  GLUCOSE 149* 112*  BUN 18 19  CREATININE 1.27* 1.30*  CALCIUM 8.1* 8.4*    CMET: Lab Results  Component Value Date   WBC 15.2 (H) 12/10/2021   HGB 8.6 (L) 12/10/2021   HCT 27.0 (L) 12/10/2021   PLT 98 (L) 12/10/2021   GLUCOSE 112 (H) 12/10/2021   CHOL 172 02/04/2021   TRIG 94.0 02/04/2021   HDL 50.10 02/04/2021   LDLCALC 103 (H) 02/04/2021   ALT 17 12/06/2021   AST 19 12/06/2021   NA 141 12/10/2021   K 3.8 12/10/2021   CL 106  12/10/2021   CREATININE 1.30 (H) 12/10/2021   BUN 19 12/10/2021   CO2 26 12/10/2021   TSH 1.05 03/05/2021   PSA 3.49 01/14/2019   INR 1.7 (H) 12/08/2021   HGBA1C 6.2 (H) 12/06/2021   MICROALBUR 1.1 09/30/2021      PT/INR:  Recent Labs    12/08/21 1532  LABPROT 19.6*  INR 1.7*   Radiology: DG Chest Port 1 View  Result Date: 12/10/2021 CLINICAL DATA:  [id number] with CABG and sore chest. EXAM: PORTABLE CHEST 1 VIEW COMPARISON:  Portable chest yesterday at 5:17 a.m. FINDINGS: 5:18 a.m. Swan-Ganz catheter interval removal with right IJ Cordis catheter remaining, stable positioning left chest tube, mediastinal drain with trace left apical pneumothorax visible on today's exam, estimated 3% or less volume. There is no mediastinal shift. CABG changes are unaltered with stable cardiac size and mild central vascular fullness. No pulmonary edema is seen. Pleural effusions appear similar as well as basilar atelectatic bands and streaky infiltrate or atelectasis in the retrocardiac left base. The remaining hypoaerated lungs are clear. The mediastinum is stable. In all other respects no further changes. IMPRESSION: 1. Trace left apical pneumothorax visible on today's exam, estimated 3% or less volume. No mediastinal shift. 2. Stable pleural effusions and basilar atelectasis. 3. Swan-Ganz catheter interval removal. 4. These results will be called to the ordering clinician or representative by the radiologist assistant, and communication documented in the PACS or [facility] dashboard. Electronically Signed   By: Telford Nab M.D.   On: 12/10/2021 07:31     Assessment/Plan: S/P Procedure(s) (LRB): CORONARY ARTERY BYPASS GRAFTING (CABG) X TWO BYPASSES USING OPEN LEFT INTERNAL MAMMARY ARTERY AND ENDOSCOPIC RIGHT GREATER SAPHENOUS VEIN HARVEST. (N/A) TRANSESOPHAGEAL ECHOCARDIOGRAM (TEE) (N/A) POD#2  1 Tmax 100.9, VSS some HTN readings, milrinone is off  2 O2 sats good on RA 3 fair UOP, weight stable, about 6  kg >preop 4 BS adeq control 6 creat slow trend higher, 1.30 - BUN normal; IV lasix ordered for today 7 slow trend higher on WBC, 15.2 today with low grade fevers will need to monitor closely for potential infectious source, will check UA today 8 CXR some L>R lower lobe atx, minimal effus 8 expected ABLA some improvement in trend- starting iron 9 routine pulm hygiene and rehab 10 CT- 80 cc- remove today 11- poss tx to floor later today  Andrew [name] 12/10/2021 7:58 AM  Walking well in hall, nsr, off O2 Will DC chest tubes after followup PCXR for small L apical pntx. With temp  elevation and rising WBC, DM with + staph nasal swab will cover with antibiotics pending repeat CXR, cultures  patient examined and medical record reviewed,agree with above note. [name] 12/10/2021

## 2021-12-10 NOTE — Plan of Care (Signed)
  Problem: Education: Goal: Understanding of CV disease, CV risk reduction, and recovery process will improve Outcome: Progressing   Problem: Activity: Goal: Ability to return to baseline activity level will improve Outcome: Progressing   Problem: Cardiovascular: Goal: Ability to achieve and maintain adequate cardiovascular perfusion will improve Outcome: Progressing Goal: Vascular access site(s) Level 0-1 will be maintained Outcome: Progressing   Problem: Health Behavior/Discharge Planning: Goal: Ability to safely manage health-related needs after discharge will improve Outcome: Progressing   Problem: Education: Goal: Knowledge of General Education information will improve Description: Including pain rating scale, medication(s)/side effects and non-pharmacologic comfort measures Outcome: Progressing   Problem: Health Behavior/Discharge Planning: Goal: Ability to manage health-related needs will improve Outcome: Progressing   Problem: Clinical Measurements: Goal: Ability to maintain clinical measurements within normal limits will improve Outcome: Progressing Goal: Will remain free from infection Outcome: Progressing Goal: Diagnostic test results will improve Outcome: Progressing Goal: Respiratory complications will improve Outcome: Progressing Goal: Cardiovascular complication will be avoided Outcome: Progressing   Problem: Activity: Goal: Risk for activity intolerance will decrease Outcome: Progressing   Problem: Nutrition: Goal: Adequate nutrition will be maintained Outcome: Progressing   Problem: Coping: Goal: Level of anxiety will decrease Outcome: Progressing   Problem: Elimination: Goal: Will not experience complications related to bowel motility Outcome: Progressing Goal: Will not experience complications related to urinary retention Outcome: Progressing   Problem: Pain Managment: Goal: General experience of comfort will improve Outcome: Progressing    Problem: Safety: Goal: Ability to remain free from injury will improve Outcome: Progressing   Problem: Skin Integrity: Goal: Risk for impaired skin integrity will decrease Outcome: Progressing   Problem: Education: Goal: Will demonstrate proper wound care and an understanding of methods to prevent future damage Outcome: Progressing Goal: Knowledge of disease or condition will improve Outcome: Progressing Goal: Knowledge of the prescribed therapeutic regimen will improve Outcome: Progressing   Problem: Activity: Goal: Risk for activity intolerance will decrease Outcome: Progressing   Problem: Cardiac: Goal: Will achieve and/or maintain hemodynamic stability Outcome: Progressing   Problem: Clinical Measurements: Goal: Postoperative complications will be avoided or minimized Outcome: Progressing   Problem: Respiratory: Goal: Respiratory status will improve Outcome: Progressing   Problem: Skin Integrity: Goal: Wound healing without signs and symptoms of infection Outcome: Progressing Goal: Risk for impaired skin integrity will decrease Outcome: Progressing

## 2021-12-11 ENCOUNTER — Other Ambulatory Visit (HOSPITAL_COMMUNITY): Payer: Medicare Other

## 2021-12-11 ENCOUNTER — Inpatient Hospital Stay (HOSPITAL_COMMUNITY): Payer: Medicare Other

## 2021-12-11 LAB — CBC
HCT: 26.9 % — ABNORMAL LOW (ref 39.0–52.0)
Hemoglobin: 8.7 g/dL — ABNORMAL LOW (ref 13.0–17.0)
MCH: 29.2 pg (ref 26.0–34.0)
MCHC: 32.3 g/dL (ref 30.0–36.0)
MCV: 90.3 fL (ref 80.0–100.0)
Platelets: 92 10*3/uL — ABNORMAL LOW (ref 150–400)
RBC: 2.98 MIL/uL — ABNORMAL LOW (ref 4.22–5.81)
RDW: 13.1 % (ref 11.5–15.5)
WBC: 13.6 10*3/uL — ABNORMAL HIGH (ref 4.0–10.5)
nRBC: 0 % (ref 0.0–0.2)

## 2021-12-11 LAB — BASIC METABOLIC PANEL
Anion gap: 7 (ref 5–15)
BUN: 24 mg/dL — ABNORMAL HIGH (ref 8–23)
CO2: 26 mmol/L (ref 22–32)
Calcium: 8.3 mg/dL — ABNORMAL LOW (ref 8.9–10.3)
Chloride: 109 mmol/L (ref 98–111)
Creatinine, Ser: 1.33 mg/dL — ABNORMAL HIGH (ref 0.61–1.24)
GFR, Estimated: 58 mL/min — ABNORMAL LOW (ref >60)
Glucose, Bld: 117 mg/dL — ABNORMAL HIGH (ref 70–99)
Potassium: 3.5 mmol/L (ref 3.5–5.1)
Sodium: 142 mmol/L (ref 135–145)

## 2021-12-11 LAB — GLUCOSE, CAPILLARY
Glucose-Capillary: 116 mg/dL — ABNORMAL HIGH (ref 70–99)
Glucose-Capillary: 137 mg/dL — ABNORMAL HIGH (ref 70–99)
Glucose-Capillary: 131 mg/dL — ABNORMAL HIGH (ref 70–99)
Glucose-Capillary: 127 mg/dL — ABNORMAL HIGH (ref 70–99)

## 2021-12-11 LAB — URINE CULTURE
Culture: NO GROWTH
Special Requests: NORMAL

## 2021-12-11 MED ORDER — POTASSIUM CHLORIDE 20 MEQ PO PACK
20.0000 meq | PACK | Freq: Two times a day (BID) | ORAL | Status: DC
  Administered 2021-12-12 – 2021-12-13 (×3): 20 meq via ORAL
  Filled 2021-12-11 (×3): qty 1

## 2021-12-11 MED ORDER — POTASSIUM CHLORIDE 10 MEQ/100ML IV SOLN: 10.0000 meq | INTRAVENOUS | Status: DC

## 2021-12-11 MED ORDER — SODIUM CHLORIDE 0.9 % IV SOLN
1.5000 g | Freq: Three times a day (TID) | INTRAVENOUS | Status: DC
  Administered 2021-12-11 – 2021-12-12 (×3): 1.5 g via INTRAVENOUS
  Filled 2021-12-11 (×4): qty 1.5

## 2021-12-11 MED ORDER — CARVEDILOL 6.25 MG PO TABS
6.2500 mg | ORAL_TABLET | Freq: Two times a day (BID) | ORAL | Status: DC
  Administered 2021-12-11 – 2021-12-14 (×7): 6.25 mg via ORAL
  Filled 2021-12-11 (×7): qty 1

## 2021-12-11 MED ORDER — POTASSIUM CHLORIDE CRYS ER 20 MEQ PO TBCR
20.0000 meq | EXTENDED_RELEASE_TABLET | ORAL | Status: DC
  Administered 2021-12-11: 20 meq via ORAL
  Filled 2021-12-11: qty 1

## 2021-12-11 MED ORDER — POTASSIUM CHLORIDE CRYS ER 20 MEQ PO TBCR
40.0000 meq | EXTENDED_RELEASE_TABLET | ORAL | Status: AC
  Administered 2021-12-11: 40 meq via ORAL
  Filled 2021-12-11: qty 2

## 2021-12-11 NOTE — Progress Notes (Signed)
PHARMACY NOTE:  ANTIMICROBIAL RENAL DOSAGE ADJUSTMENT  Current antimicrobial regimen includes a mismatch between antimicrobial dosage and estimated renal function.  As per policy approved by the Pharmacy & Therapeutics and Medical Executive Committees, the antimicrobial dosage will be adjusted accordingly.  Current antimicrobial dosage:  Zinacef 1.5gm IV q12h  Indication: PNA  Renal Function:  Estimated Creatinine Clearance: 67.4 mL/min (A) (by C-G formula based on SCr of 1.33 mg/dL (H)). '[]'$      On intermittent HD, scheduled: '[]'$      On CRRT    Antimicrobial dosage has been changed to:   Zinacef 1.5gm IV q8h  Additional comments:   Thank you for allowing pharmacy to be a part of this patient's care.  Hildred Laser, PharmD Clinical Pharmacist **Pharmacist phone directory can now be found on [email or web address] ([email or web address]).  Listed under [facility].  e
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
## 2021-12-11 NOTE — Progress Notes (Signed)
CT surgery PM rounds  Patient had an excellent day Positive for bowel movement Walked in the hallway with physical therapy recommends home care at discharge Waiting for bed on stepdown unit  Blood pressure (!) 149/70, pulse 78, temperature (!) 97.3 F (36.3 C), temperature source Axillary, resp. rate 17, height '6\' 2"'$  (1.88 m), weight 107.3 kg, SpO2 95 %.

## 2021-12-11 NOTE — Progress Notes (Signed)
3 Days Post-Op Procedure(s) (LRB): CORONARY ARTERY BYPASS GRAFTING (CABG) X TWO BYPASSES USING OPEN LEFT INTERNAL MAMMARY ARTERY AND ENDOSCOPIC RIGHT GREATER SAPHENOUS VEIN HARVEST. (N/A) TRANSESOPHAGEAL ECHOCARDIOGRAM (TEE) (N/A) Subjective: Multiple BM last night Walking in hall Objective: Vital signs in last 24 hours: Temp:  [97.9 F (36.6 C)-98.8 F (37.1 C)] 98.8 F (37.1 C) (12/01 1605) Pulse Rate:  [70-79] 78 (12/02 0900) Cardiac Rhythm: Normal sinus rhythm (12/02 0800) Resp:  [15-27] 17 (12/02 0900) BP: (111-174)/(53-104) 149/70 (12/02 0900) SpO2:  [92 %-98 %] 95 % (12/02 0900) Weight:  [107.3 kg] 107.3 kg (12/02 0500)  Hemodynamic parameters for last 24 hours:  nsr  Intake/Output from previous day: 12/01 0701 - 12/02 0700 In: 449.7 [P.O.:236; I.V.:13.7; IV Piggyback:200] Out: 860 [Urine:810; Chest Tube:50] Intake/Output this shift: No intake/output data recorded.       Exam    General- alert and comfortable    Neck- no JVD, no cervical adenopathy palpable, no carotid bruit   Lungs- clear without rales, wheezes   Cor- regular rate and rhythm, no murmur , gallop   Abdomen- soft, non-tender   Extremities - warm, non-tender, minimal edema   Neuro- oriented, appropriate, no focal weakness   Lab Results: Recent Labs    12/10/21 0512 12/11/21 0446  WBC 15.2* 13.6*  HGB 8.6* 8.7*  HCT 27.0* 26.9*  PLT 98* 92*   BMET:  Recent Labs    12/10/21 0512 12/11/21 0446  NA 141 142  K 3.8 3.5  CL 106 109  CO2 26 26  GLUCOSE 112* 117*  BUN 19 24*  CREATININE 1.30* 1.33*  CALCIUM 8.4* 8.3*    PT/INR:  Recent Labs    12/08/21 1532  LABPROT 19.6*  INR 1.7*   ABG    Component Value Date/Time   PHART 7.309 (L) 12/08/2021 1959   HCO3 19.8 (L) 12/08/2021 1959   TCO2 21 (L) 12/08/2021 1959   ACIDBASEDEF 6.0 (H) 12/08/2021 1959   O2SAT 60.7 12/10/2021 0512   CBG (last 3)  Recent Labs    12/10/21 1603 12/10/21 2124 12/11/21 0658  GLUCAP 153* 156*  116*    Assessment/Plan: S/P Procedure(s) (LRB): CORONARY ARTERY BYPASS GRAFTING (CABG) X TWO BYPASSES USING OPEN LEFT INTERNAL MAMMARY ARTERY AND ENDOSCOPIC RIGHT GREATER SAPHENOUS VEIN HARVEST. (N/A) TRANSESOPHAGEAL ECHOCARDIOGRAM (TEE) (N/A) Mobilize Diuresis Diabetes control Plan for transfer to step-down: see transfer orders   LOS: 3 days    Andrew [name] 12/11/2021

## 2021-12-12 LAB — GLUCOSE, CAPILLARY
Glucose-Capillary: 91 mg/dL (ref 70–99)
Glucose-Capillary: 132 mg/dL — ABNORMAL HIGH (ref 70–99)
Glucose-Capillary: 105 mg/dL — ABNORMAL HIGH (ref 70–99)
Glucose-Capillary: 131 mg/dL — ABNORMAL HIGH (ref 70–99)

## 2021-12-12 LAB — CBC
HCT: 25 % — ABNORMAL LOW (ref 39.0–52.0)
Hemoglobin: 7.9 g/dL — ABNORMAL LOW (ref 13.0–17.0)
MCH: 28.7 pg (ref 26.0–34.0)
MCHC: 31.6 g/dL (ref 30.0–36.0)
MCV: 90.9 fL (ref 80.0–100.0)
Platelets: 116 10*3/uL — ABNORMAL LOW (ref 150–400)
RBC: 2.75 MIL/uL — ABNORMAL LOW (ref 4.22–5.81)
RDW: 13.1 % (ref 11.5–15.5)
WBC: 9.9 10*3/uL (ref 4.0–10.5)
nRBC: 0 % (ref 0.0–0.2)

## 2021-12-12 LAB — BASIC METABOLIC PANEL
Anion gap: 8 (ref 5–15)
BUN: 22 mg/dL (ref 8–23)
CO2: 25 mmol/L (ref 22–32)
Calcium: 8.1 mg/dL — ABNORMAL LOW (ref 8.9–10.3)
Chloride: 111 mmol/L (ref 98–111)
Creatinine, Ser: 1.25 mg/dL — ABNORMAL HIGH (ref 0.61–1.24)
GFR, Estimated: >60 mL/min (ref >60)
Glucose, Bld: 107 mg/dL — ABNORMAL HIGH (ref 70–99)
Potassium: 3.8 mmol/L (ref 3.5–5.1)
Sodium: 144 mmol/L (ref 135–145)

## 2021-12-12 MED ORDER — FUROSEMIDE 40 MG PO TABS
40.0000 mg | ORAL_TABLET | Freq: Every day | ORAL | Status: DC
  Administered 2021-12-12 – 2021-12-14 (×3): 40 mg via ORAL
  Filled 2021-12-12 (×3): qty 1

## 2021-12-12 MED ORDER — SODIUM CHLORIDE 0.9 % IV SOLN
1.5000 g | Freq: Three times a day (TID) | INTRAVENOUS | Status: AC
  Administered 2021-12-12 – 2021-12-13 (×5): 1.5 g via INTRAVENOUS
  Filled 2021-12-12 (×7): qty 1.5

## 2021-12-12 NOTE — Progress Notes (Signed)
4 Days Post-Op Procedure(s) (LRB): CORONARY ARTERY BYPASS GRAFTING (CABG) X TWO BYPASSES USING OPEN LEFT INTERNAL MAMMARY ARTERY AND ENDOSCOPIC RIGHT GREATER SAPHENOUS VEIN HARVEST. (N/A) TRANSESOPHAGEAL ECHOCARDIOGRAM (TEE) (N/A) Subjective: Daily progress- now on 4E NSR Room air, CXR clear Fever and leukocytosis improving  Objective: Vital signs in last 24 hours: Temp:  [97.3 F (36.3 C)-98.7 F (37.1 C)] 98.4 F (36.9 C) (12/03 1300) Pulse Rate:  [73-87] 75 (12/03 1300) Cardiac Rhythm: Normal sinus rhythm (12/03 0800) Resp:  [17-31] 20 (12/03 1300) BP: (94-162)/(56-80) 140/69 (12/03 1300) SpO2:  [94 %-100 %] 99 % (12/03 1300) Weight:  [104.3 kg] 104.3 kg (12/03 0500)  Hemodynamic parameters for last 24 hours:    Intake/Output from previous day: 12/02 0701 - 12/03 0700 In: 100 [IV Piggyback:100] Out: 1180 [Urine:1180] Intake/Output this shift: Total I/O In: -  Out: 300 [Urine:300]       Exam    General- alert and comfortable    Neck- no JVD, no cervical adenopathy palpable, no carotid bruit   Lungs- clear without rales, wheezes   Cor- regular rate and rhythm, no murmur , gallop   Abdomen- soft, non-tender   Extremities - warm, non-tender, minimal edema   Neuro- oriented, appropriate, no focal weakness   Lab Results: Recent Labs    12/11/21 0446 12/12/21 0250  WBC 13.6* 9.9  HGB 8.7* 7.9*  HCT 26.9* 25.0*  PLT 92* 116*   BMET:  Recent Labs    12/11/21 0446 12/12/21 0250  NA 142 144  K 3.5 3.8  CL 109 111  CO2 26 25  GLUCOSE 117* 107*  BUN 24* 22  CREATININE 1.33* 1.25*  CALCIUM 8.3* 8.1*    PT/INR: No results for input(s): "LABPROT", "INR" in the last 72 hours. ABG    Component Value Date/Time   PHART 7.309 (L) 12/08/2021 1959   HCO3 19.8 (L) 12/08/2021 1959   TCO2 21 (L) 12/08/2021 1959   ACIDBASEDEF 6.0 (H) 12/08/2021 1959   O2SAT 60.7 12/10/2021 0512   CBG (last 3)  Recent Labs    12/11/21 2105 12/12/21 0626 12/12/21 1133   GLUCAP 127* 91 132*    Assessment/Plan: S/P Procedure(s) (LRB): CORONARY ARTERY BYPASS GRAFTING (CABG) X TWO BYPASSES USING OPEN LEFT INTERNAL MAMMARY ARTERY AND ENDOSCOPIC RIGHT GREATER SAPHENOUS VEIN HARVEST. (N/A) TRANSESOPHAGEAL ECHOCARDIOGRAM (TEE) (N/A) Cont iron/ vits for pre and postop anemia Face to face for home PT completed DC EPWs Monday, prob home Tues  LOS: 4 days    [name] 12/12/2021

## 2021-12-12 NOTE — Anesthesia Postprocedure Evaluation (Signed)
Anesthesia Post Note  Patient: Andrew [name]  Procedure(s) Performed: CORONARY ARTERY BYPASS GRAFTING (CABG) X TWO BYPASSES USING OPEN LEFT INTERNAL MAMMARY ARTERY AND ENDOSCOPIC RIGHT GREATER SAPHENOUS VEIN HARVEST. (Chest) TRANSESOPHAGEAL ECHOCARDIOGRAM (TEE) (Esophagus)     Patient location during evaluation: ICU Anesthesia Type: General Level of consciousness: sedated Pain management: pain level controlled Vital Signs Assessment: post-procedure vital signs reviewed and stable Respiratory status: patient remains intubated per anesthesia plan Cardiovascular status: stable Postop Assessment: no apparent nausea or vomiting Anesthetic complications: no   No notable events documented.   Pain Goal: Patients Stated Pain Goal: 0 (12/11/21 2000)                 Arliss Frisina

## 2021-12-13 ENCOUNTER — Other Ambulatory Visit: Payer: Self-pay

## 2021-12-13 LAB — CBC
HCT: 24.1 % — ABNORMAL LOW (ref 39.0–52.0)
Hemoglobin: 7.9 g/dL — ABNORMAL LOW (ref 13.0–17.0)
MCH: 28.9 pg (ref 26.0–34.0)
MCHC: 32.8 g/dL (ref 30.0–36.0)
MCV: 88.3 fL (ref 80.0–100.0)
Platelets: 128 10*3/uL — ABNORMAL LOW (ref 150–400)
RBC: 2.73 MIL/uL — ABNORMAL LOW (ref 4.22–5.81)
RDW: 13.3 % (ref 11.5–15.5)
WBC: 6.4 10*3/uL (ref 4.0–10.5)
nRBC: 0 % (ref 0.0–0.2)

## 2021-12-13 LAB — BASIC METABOLIC PANEL
Anion gap: 8 (ref 5–15)
BUN: 19 mg/dL (ref 8–23)
CO2: 27 mmol/L (ref 22–32)
Calcium: 8.2 mg/dL — ABNORMAL LOW (ref 8.9–10.3)
Chloride: 109 mmol/L (ref 98–111)
Creatinine, Ser: 1.12 mg/dL (ref 0.61–1.24)
GFR, Estimated: >60 mL/min (ref >60)
Glucose, Bld: 94 mg/dL (ref 70–99)
Potassium: 3.5 mmol/L (ref 3.5–5.1)
Sodium: 144 mmol/L (ref 135–145)

## 2021-12-13 LAB — GLUCOSE, CAPILLARY
Glucose-Capillary: 88 mg/dL (ref 70–99)
Glucose-Capillary: 95 mg/dL (ref 70–99)
Glucose-Capillary: 83 mg/dL (ref 70–99)
Glucose-Capillary: 50 mg/dL — ABNORMAL LOW (ref 70–99)
Glucose-Capillary: 109 mg/dL — ABNORMAL HIGH (ref 70–99)

## 2021-12-13 MED ORDER — DAPAGLIFLOZIN PROPANEDIOL 10 MG PO TABS
10.0000 mg | ORAL_TABLET | Freq: Every day | ORAL | Status: DC
  Administered 2021-12-13 – 2021-12-14 (×2): 10 mg via ORAL
  Filled 2021-12-13 (×2): qty 1

## 2021-12-13 MED ORDER — CLOPIDOGREL BISULFATE 75 MG PO TABS
75.0000 mg | ORAL_TABLET | Freq: Every day | ORAL | Status: DC
  Administered 2021-12-14: 75 mg via ORAL
  Filled 2021-12-13: qty 1

## 2021-12-13 MED ORDER — POTASSIUM CHLORIDE 20 MEQ PO PACK
40.0000 meq | PACK | Freq: Two times a day (BID) | ORAL | Status: DC
  Administered 2021-12-13 – 2021-12-14 (×2): 40 meq via ORAL
  Filled 2021-12-13 (×2): qty 2

## 2021-12-13 MED ORDER — LINAGLIPTIN 5 MG PO TABS
5.0000 mg | ORAL_TABLET | Freq: Every day | ORAL | Status: DC
  Administered 2021-12-13 – 2021-12-14 (×2): 5 mg via ORAL
  Filled 2021-12-13 (×2): qty 1
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
## 2021-12-13 NOTE — Progress Notes (Signed)
Mobility Specialist Progress Note:   12/13/21 1016  Mobility  Activity Ambulated independently in room  Level of Assistance Modified independent, requires aide device or extra time  Assistive Device [facility] Ambulated (ft) 20 ft  Activity Response Tolerated well  Mobility Referral Yes  $Mobility charge 1 Mobility   Pt ambulated in room independently. No complaints. Left in bed with call bell in reach and all needs met. Will follow-up as time allows for hallway ambulation.   Gareth Eagle Evertte Sones Mobility Specialist Please contact via [employer] or  Rehab Office at [phone]

## 2021-12-13 NOTE — Progress Notes (Addendum)
[address]       [place] [address]             [phone]     5 Days Post-Op Procedure(s) (LRB): CORONARY ARTERY BYPASS GRAFTING (CABG) X TWO BYPASSES USING OPEN LEFT INTERNAL MAMMARY ARTERY AND ENDOSCOPIC RIGHT GREATER SAPHENOUS VEIN HARVEST. (N/A) TRANSESOPHAGEAL ECHOCARDIOGRAM (TEE) (N/A) Subjective: Some minor discomforts  Objective: Vital signs in last 24 hours: Temp:  [97.8 F (36.6 C)-98.7 F (37.1 C)] 98.3 F (36.8 C) (12/04 0447) Pulse Rate:  [73-83] 80 (12/04 0447) Cardiac Rhythm: Normal sinus rhythm (12/03 1958) Resp:  [17-23] 18 (12/04 0447) BP: (119-155)/(60-82) 155/72 (12/04 0447) SpO2:  [95 %-99 %] 99 % (12/04 0447) Weight:  [103.9 kg] 103.9 kg (12/04 0431)  Hemodynamic parameters for last 24 hours:    Intake/Output from previous day: 12/03 0701 - 12/04 0700 In: 200.1 [IV Piggyback:200.1] Out: 300 [Urine:300] Intake/Output this shift: No intake/output data recorded.  General appearance: alert, cooperative, and no distress Heart: regular rate and rhythm Lungs: min dim in the bases Abdomen: benign Extremities: minor ankle edema Wound: incis healing well  Lab Results: Recent Labs    12/12/21 0250 12/13/21 0150  WBC 9.9 6.4  HGB 7.9* 7.9*  HCT 25.0* 24.1*  PLT 116* 128*   BMET:  Recent Labs    12/12/21 0250 12/13/21 0150  NA 144 144  K 3.8 3.5  CL 111 109  CO2 25 27  GLUCOSE 107* 94  BUN 22 19  CREATININE 1.25* 1.12  CALCIUM 8.1* 8.2*    PT/INR: No results for input(s): "LABPROT", "INR" in the last 72 hours. ABG    Component Value Date/Time   PHART 7.309 (L) 12/08/2021 1959   HCO3 19.8 (L) 12/08/2021 1959   TCO2 21 (L) 12/08/2021 1959   ACIDBASEDEF 6.0 (H) 12/08/2021 1959   O2SAT 60.7 12/10/2021 0512   CBG (last 3)  Recent Labs    12/12/21 1655 12/12/21 2044 12/13/21 0641  GLUCAP 105* 131* 88    Meds Scheduled Meds:  sodium chloride   Intravenous Once   acetaminophen  1,000 mg Oral Q6H   Or    acetaminophen (TYLENOL) oral liquid 160 mg/5 mL  1,000 mg Per Tube Q6H   aspirin EC  81 mg Oral Daily   Or   aspirin  81 mg Per Tube Daily   bisacodyl  10 mg Oral Daily   Or   bisacodyl  10 mg Rectal Daily   carbamazepine  200 mg Oral Daily   carvedilol  6.25 mg Oral BID WC   Chlorhexidine Gluconate Cloth  6 each Topical Daily   cloNIDine  0.3 mg Transdermal Weekly   dorzolamide-timolol  1 drop Left Eye Daily   enoxaparin (LOVENOX) injection  40 mg Subcutaneous QHS   Fe Fum-Vit C-Vit B12-FA  1 capsule Oral QPC breakfast   fluticasone  2 spray Each Nare Daily   furosemide  40 mg Oral Daily   insulin aspart  0-24 Units Subcutaneous TID WC   insulin aspart  0-5 Units Subcutaneous QHS   insulin detemir  10 Units Subcutaneous BID   latanoprost  1 drop Both Eyes QHS   loratadine  10 mg Oral Daily   mupirocin cream   Topical BID   pantoprazole  40 mg Oral Daily   potassium chloride  20 mEq Oral BID   rosuvastatin  40 mg Oral Daily   sodium chloride flush  3 mL Intravenous Q12H  Continuous Infusions:  sodium chloride Stopped (12/09/21 0816)   sodium chloride     sodium chloride     albumin human     cefUROXime (ZINACEF)  IV 1.5 g (12/13/21 0619)   PRN Meds:.sodium chloride, albumin human, dextrose, ondansetron (ZOFRAN) IV, mouth rinse, oxyCODONE, sodium chloride flush, temazepam, traMADol  Xrays No results found.  Assessment/Plan: S/P Procedure(s) (LRB): CORONARY ARTERY BYPASS GRAFTING (CABG) X TWO BYPASSES USING OPEN LEFT INTERNAL MAMMARY ARTERY AND ENDOSCOPIC RIGHT GREATER SAPHENOUS VEIN HARVEST. (N/A) TRANSESOPHAGEAL ECHOCARDIOGRAM (TEE) (N/A) POD #5  1 afebrile, SBP 119-155 range sinus rhythm 2 oxygen saturations are good on room air 3 voiding well, not all measured, weight trending lower-approximately 4 kg greater than preop 4 renal function has returned to normal range, cont current diuretics for now 5 blood sugars well-controlled-will resume partial home meds now  and DC insulin, should be able to resume all home meds at discharge 6 expected acute blood loss anemia-fairly stable over the past several days but slightly lower trend-continue iron supplement 7 thrombocytopenia trend continues to improve. 8 cont rehab and pulm hygiene 9 ? Resume plavix, on lovenox for DVT ppx  LOS: 5 days    Andrew [name] 12/13/2021  Progressing well, has balance problems after CVA and uses cane- home PT ordered Will DC EPWs and start plavix, 81 asa Home in am   patient examined and medical record reviewed,agree with above note. [name] 12/13/2021

## 2021-12-13 NOTE — Progress Notes (Signed)
CARDIAC REHAB PHASE I     Pt sitting in chair, waiting for breakfast. Worked with PT this morning, ambulating in hall and up and down stair. Pt reports tolerating well. Encouraged 2 more walks today and IS use. Reviewed IS importance and proper sternal precautions. Will return later today to offer walk as time permits. Will continue to follow.  [id number]  Vanessa Barbara, RN BSN 12/13/2021 9:11 AM

## 2021-12-13 NOTE — Progress Notes (Signed)
CARDIAC REHAB PHASE I   Pt assisted oob to bathroom, using proper sternal precautions. Returned to side of bed with call bell and bedside table in reach. Post OHS education including site care, restrictions, risk factors, exercise guidelines, heart healthy diabetic diet, IS use at home, home needs at discharge and CRP2 reviewed. All questions and concerns addressed. Will refer to [facility]  for CRP2. Will continue to follow.    [id number]  Vanessa Barbara, RN BSN 12/13/2021 2:39 PM

## 2021-12-13 NOTE — Progress Notes (Deleted)
Pacing wires removed per order 

## 2021-12-14 LAB — BASIC METABOLIC PANEL
Anion gap: 10 (ref 5–15)
BUN: 14 mg/dL (ref 8–23)
CO2: 25 mmol/L (ref 22–32)
Calcium: 8.6 mg/dL — ABNORMAL LOW (ref 8.9–10.3)
Chloride: 107 mmol/L (ref 98–111)
Creatinine, Ser: 1.08 mg/dL (ref 0.61–1.24)
GFR, Estimated: >60 mL/min (ref >60)
Glucose, Bld: 98 mg/dL (ref 70–99)
Potassium: 4 mmol/L (ref 3.5–5.1)
Sodium: 142 mmol/L (ref 135–145)

## 2021-12-14 LAB — CBC
HCT: 28.1 % — ABNORMAL LOW (ref 39.0–52.0)
Hemoglobin: 9.2 g/dL — ABNORMAL LOW (ref 13.0–17.0)
MCH: 29.4 pg (ref 26.0–34.0)
MCHC: 32.7 g/dL (ref 30.0–36.0)
MCV: 89.8 fL (ref 80.0–100.0)
Platelets: 169 10*3/uL (ref 150–400)
RBC: 3.13 MIL/uL — ABNORMAL LOW (ref 4.22–5.81)
RDW: 13.2 % (ref 11.5–15.5)
WBC: 7.3 10*3/uL (ref 4.0–10.5)
nRBC: 0 % (ref 0.0–0.2)

## 2021-12-14 LAB — GLUCOSE, CAPILLARY: Glucose-Capillary: 91 mg/dL (ref 70–99)

## 2021-12-14 MED ORDER — OXYCODONE HCL 5 MG PO TABS: 5.0000 mg | ORAL_TABLET | Freq: Four times a day (QID) | ORAL | 0 refills | Status: AC | PRN

## 2021-12-14 MED ORDER — CLOPIDOGREL BISULFATE 75 MG PO TABS: 75.0000 mg | ORAL_TABLET | Freq: Every day | ORAL | Status: DC

## 2021-12-14 MED ORDER — CARVEDILOL 6.25 MG PO TABS: 6.2500 mg | ORAL_TABLET | Freq: Two times a day (BID) | ORAL | 1 refills | Status: DC

## 2021-12-14 MED ORDER — FE FUM-VIT C-VIT B12-FA 460-60-0.01-1 MG PO CAPS: 1.0000 | ORAL_CAPSULE | Freq: Every day | ORAL | 3 refills | Status: DC

## 2021-12-14 MED ORDER — PANTOPRAZOLE SODIUM 40 MG PO TBEC: 40.0000 mg | DELAYED_RELEASE_TABLET | Freq: Every day | ORAL | 1 refills | Status: DC

## 2021-12-14 NOTE — Progress Notes (Addendum)
[address]       [place] [address]             [phone]     6 Days Post-Op Procedure(s) (LRB): CORONARY ARTERY BYPASS GRAFTING (CABG) X TWO BYPASSES USING OPEN LEFT INTERNAL MAMMARY ARTERY AND ENDOSCOPIC RIGHT GREATER SAPHENOUS VEIN HARVEST. (N/A) TRANSESOPHAGEAL ECHOCARDIOGRAM (TEE) (N/A) Subjective: Feels well  Objective: Vital signs in last 24 hours: Temp:  [98.3 F (36.8 C)-98.9 F (37.2 C)] 98.8 F (37.1 C) (12/05 0452) Pulse Rate:  [76-81] 81 (12/05 0452) Cardiac Rhythm: Normal sinus rhythm (12/04 2000) Resp:  [19-25] 20 (12/05 0452) BP: (140-166)/(70-81) 157/75 (12/05 0452) SpO2:  [96 %-100 %] 97 % (12/05 0452) Weight:  [101.2 kg] 101.2 kg (12/05 0540)  Hemodynamic parameters for last 24 hours:    Intake/Output from previous day: 12/04 0701 - 12/05 0700 In: 720 [P.O.:720] Out: -  Intake/Output this shift: No intake/output data recorded.  General appearance: alert, cooperative, and no distress Heart: regular rate and rhythm Lungs: mildly dim in bases Abdomen: benign Extremities: minor ankle edema Wound: incis healing well  Lab Results: Recent Labs    12/13/21 0150 12/14/21 0214  WBC 6.4 7.3  HGB 7.9* 9.2*  HCT 24.1* 28.1*  PLT 128* 169   BMET:  Recent Labs    12/13/21 0150 12/14/21 0214  NA 144 142  K 3.5 4.0  CL 109 107  CO2 27 25  GLUCOSE 94 98  BUN 19 14  CREATININE 1.12 1.08  CALCIUM 8.2* 8.6*    PT/INR: No results for input(s): "LABPROT", "INR" in the last 72 hours. ABG    Component Value Date/Time   PHART 7.309 (L) 12/08/2021 1959   HCO3 19.8 (L) 12/08/2021 1959   TCO2 21 (L) 12/08/2021 1959   ACIDBASEDEF 6.0 (H) 12/08/2021 1959   O2SAT 60.7 12/10/2021 0512   CBG (last 3)  Recent Labs    12/13/21 1645 12/13/21 2122 12/14/21 0644  GLUCAP 50* 109* 91    Meds Scheduled Meds:  aspirin EC  81 mg Oral Daily   Or   aspirin  81 mg Per Tube Daily   bisacodyl  10 mg Oral Daily   Or   bisacodyl   10 mg Rectal Daily   carbamazepine  200 mg Oral Daily   carvedilol  6.25 mg Oral BID WC   Chlorhexidine Gluconate Cloth  6 each Topical Daily   cloNIDine  0.3 mg Transdermal Weekly   clopidogrel  75 mg Oral Daily   dapagliflozin propanediol  10 mg Oral QAC breakfast   dorzolamide-timolol  1 drop Left Eye Daily   Fe Fum-Vit C-Vit B12-FA  1 capsule Oral QPC breakfast   fluticasone  2 spray Each Nare Daily   furosemide  40 mg Oral Daily   insulin aspart  0-24 Units Subcutaneous TID WC   insulin aspart  0-5 Units Subcutaneous QHS   latanoprost  1 drop Both Eyes QHS   linagliptin  5 mg Oral Daily   loratadine  10 mg Oral Daily   mupirocin cream   Topical BID   pantoprazole  40 mg Oral Daily   potassium chloride  40 mEq Oral BID   rosuvastatin  40 mg Oral Daily   sodium chloride flush  3 mL Intravenous Q12H   Continuous Infusions:  sodium chloride Stopped (12/09/21 0816)   sodium chloride     sodium chloride     albumin human     PRN Meds:.sodium  chloride, albumin human, dextrose, ondansetron (ZOFRAN) IV, mouth rinse, oxyCODONE, sodium chloride flush, temazepam, traMADol  Xrays No results found.  Assessment/Plan: S/P Procedure(s) (LRB): CORONARY ARTERY BYPASS GRAFTING (CABG) X TWO BYPASSES USING OPEN LEFT INTERNAL MAMMARY ARTERY AND ENDOSCOPIC RIGHT GREATER SAPHENOUS VEIN HARVEST. (N/A) TRANSESOPHAGEAL ECHOCARDIOGRAM (TEE) (N/A) POD 6  1 afebrile, s bp 140-160's, NSR, will re-order norvasc and ARB at d/c  2 O2 sats good on RA 3 weight at preop 4 voiding, not all measured, I think we can stop lasix 5 BS pretty well controlled, one reading of 50- home meds at d/c except actos for now 6 normal renal fxn 7 H/H improved expected ABLA with equilibration of volume status 8 thrombocytopenia resolved 9 back on plavix, baby asa 10 - stable for d/c     LOS: 6 days    Andrew [name] [phone] 12/14/2021  Patient examined , agree with assessment DC instructions d/w  patient  P Prescott Gum MD

## 2021-12-14 NOTE — TOC Transition Note (Signed)
Transition of Care (TOC) - CM/SW Discharge Note Marvetta Gibbons RN, BSN Transitions of Care Unit 4E- RN Case Manager See Treatment Team for direct phone #   Patient Details  Name: Bosco Paparella MRN: [id number] Date of Birth: [birth year]  Transition of Care [facility]) CM/SW Contact:  Dawayne Patricia, RN Phone Number: 12/14/2021, 2:58 PM   Clinical Narrative:    Pt stable for transition home today, wife to transport home.  CM notified by Latricia Heft they are following patient with MD office protocol referral prearranged for [facility] needs.  CM has let liaison know that pt return home today for start of care needs- [facility] will reach out to schedule for [facility] needs.   No other TOC needs noted.    Final next level of care: [place] Barriers to Discharge: No Barriers Identified   Patient Goals and CMS Choice Patient states their goals for this hospitalization and ongoing recovery are:: return home      Discharge Placement                 Home w/ [facility]      Discharge Plan and Services   Discharge Planning Services: CM Consult Post Acute Care Choice: Home Health          DME Arranged: N/A DME Agency: NA         HH Agency: [place] Date [facility] Agency Contacted: 12/14/21 Time [place]: [place] Representative spoke with at [place]: [place] Determinants of Health ([place]) Interventions     Readmission Risk Interventions     No data to display

## 2021-12-14 NOTE — Progress Notes (Signed)
Mobility Specialist Progress Note:   12/14/21 0800  Mobility  Activity Ambulated with assistance in hallway  Level of Assistance Standby assist, set-up cues, supervision of patient - no hands on  Assistive Device Cane  Distance Ambulated (ft) 200 ft  Activity Response Tolerated well  $Mobility charge 1 Mobility   Pt received in bed willing to participate in mobility. No complaints of pain. Left in bed with call bell in reach and all needs met.   Gareth Eagle Guenther [place] Mobility Specialist Please contact via [employer] or  Rehab Office at [phone]

## 2021-12-14 NOTE — Care Management Important Message (Signed)
Important Message  Patient Details  Name: Andrew [name] MRN: [id number] Date of Birth: 08-14-1950   Medicare Important Message Given:  Yes     Andrew [name] 12/14/2021, 9:29 AM

## 2021-12-14 NOTE — Progress Notes (Signed)
CARDIAC REHAB PHASE I     Pt resting in bed feeling well today. Ambulated in hall with mobility team this morning, tolerating well. Plan for discharge home today. Reviewed home education provided yesterday. Referral sent to [facility] for CRP2.    [id number] Vanessa Barbara, RN BSN 12/14/2021 9:49 AM

## 2021-12-15 ENCOUNTER — Encounter: Payer: Self-pay | Admitting: *Deleted

## 2021-12-15 ENCOUNTER — Telehealth: Payer: Self-pay | Admitting: *Deleted

## 2021-12-15 NOTE — Patient Outreach (Signed)
  Care Coordination [facility] Note Transition Care Management Follow-up Telephone Call Date of discharge and from where: Tuesday, 12/14/21 Andrew [name]; CAD- planned CABG x 2 How have you been since you were released from the hospital? "I am doing real good; not having any problems and doing everything they've told me to.  My wife and daughters are keeping a close eye on me, but I am able to take care of myself.  My wife helps me with my medicines.  I am not in pain, just a little sore from the surgery." Any questions or concerns? No  Items Reviewed: Did the pt receive and understand the discharge instructions provided? Yes  Medications obtained and verified? Yes - patient declines full medication review today- states his wife is currently upstairs and he is downstairs; wife manages medications; patient confirms that he has made medication changes as recommended by hospital discharging physician and confirms he has obtained all new prescriptions  Other? No  Any new allergies since your discharge? No  Dietary orders reviewed? Yes Do you have support at home? Yes  wife and daughters assisting with care needs as indicated; patient reports he is independent in self-care  Home Care and Equipment/Supplies: Were home health services ordered? yes If so, what is the name of the agency? [facility]  Has the agency set up a time to come to the patient's home? Yes- verifies he has heard from home health and initial home visit set up for Thursday 12/16/21 Were any new equipment or medical supplies ordered?  No What is the name of the medical supply agency? N/A Were you able to get the supplies/equipment? not applicable Do you have any questions related to the use of the equipment or supplies? No N/A  Functional Questionnaire: (I = Independent and D = Dependent) ADLs: I  wife and daughters assisting with care needs as indicated  Bathing/Dressing- I  Meal Prep- I  wife and daughters assisting with care needs as  indicated  Eating- I  Maintaining continence- I  Transferring/Ambulation- I  Managing Meds- I spouse manages medications  Follow up appointments reviewed:  PCP Hospital f/u appt confirmed? Yes  Scheduled to see PCP on 02/07/21 @ 11:00 am- verified this is specified time frame for follow up per hospital discharge instructions [place] [facility] f/u appt confirmed? Yes  Scheduled to see [facility] surgical provider Dr. Darcey Nora on January 11, 2022 @ 12:15 pm- verified this is specified time frame for follow up per hospital discharge instructions Are transportation arrangements needed? No  If their condition worsens, is the pt aware to call PCP or go to the Emergency Dept.? Yes Was the patient provided with contact information for the PCP's office or ED? No- patient reports he already has contact information for all care providers Was to pt encouraged to call back with questions or concerns? Yes  SDOH assessments and interventions completed:   Yes SDOH Interventions Today    Flowsheet Row Most Recent Value  SDOH Interventions   Food Insecurity Interventions Intervention Not Indicated  Transportation Interventions Intervention Not Indicated  [patient normally drives self,  family assisting with transportation after recent CABG]      Care Coordination Interventions:  Provided education around post-CABG activity, bathing and driving restrictions    Encounter Outcome:  Pt. Visit Completed    Oneta Rack, RN, BSN, CCRN Alumnus RN CM Care Coordination/ Transition of [place] Management [phone]: direct office

## 2021-12-16 DIAGNOSIS — H53131 Sudden visual loss, right eye: Secondary | ICD-10-CM | POA: Diagnosis not present

## 2021-12-16 DIAGNOSIS — Z48812 Encounter for surgical aftercare following surgery on the circulatory system: Secondary | ICD-10-CM | POA: Diagnosis not present

## 2021-12-16 DIAGNOSIS — Z8673 Personal history of transient ischemic attack (TIA), and cerebral infarction without residual deficits: Secondary | ICD-10-CM | POA: Diagnosis not present

## 2021-12-16 DIAGNOSIS — Z7982 Long term (current) use of aspirin: Secondary | ICD-10-CM | POA: Diagnosis not present

## 2021-12-16 DIAGNOSIS — Z6828 Body mass index (BMI) 28.0-28.9, adult: Secondary | ICD-10-CM | POA: Diagnosis not present

## 2021-12-16 DIAGNOSIS — M1990 Unspecified osteoarthritis, unspecified site: Secondary | ICD-10-CM | POA: Diagnosis not present

## 2021-12-16 DIAGNOSIS — I25119 Atherosclerotic heart disease of native coronary artery with unspecified angina pectoris: Secondary | ICD-10-CM | POA: Diagnosis not present

## 2021-12-16 DIAGNOSIS — H409 Unspecified glaucoma: Secondary | ICD-10-CM | POA: Diagnosis not present

## 2021-12-16 DIAGNOSIS — R569 Unspecified convulsions: Secondary | ICD-10-CM | POA: Diagnosis not present

## 2021-12-16 DIAGNOSIS — Z7984 Long term (current) use of oral hypoglycemic drugs: Secondary | ICD-10-CM | POA: Diagnosis not present

## 2021-12-16 DIAGNOSIS — I119 Hypertensive heart disease without heart failure: Secondary | ICD-10-CM | POA: Diagnosis not present

## 2021-12-16 DIAGNOSIS — E119 Type 2 diabetes mellitus without complications: Secondary | ICD-10-CM | POA: Diagnosis not present

## 2021-12-16 DIAGNOSIS — E785 Hyperlipidemia, unspecified: Secondary | ICD-10-CM | POA: Diagnosis not present

## 2021-12-16 DIAGNOSIS — E669 Obesity, unspecified: Secondary | ICD-10-CM | POA: Diagnosis not present

## 2021-12-16 DIAGNOSIS — Z7902 Long term (current) use of antithrombotics/antiplatelets: Secondary | ICD-10-CM | POA: Diagnosis not present

## 2021-12-16 DIAGNOSIS — K219 Gastro-esophageal reflux disease without esophagitis: Secondary | ICD-10-CM | POA: Diagnosis not present

## 2021-12-17 ENCOUNTER — Ambulatory Visit (INDEPENDENT_AMBULATORY_CARE_PROVIDER_SITE_OTHER): Payer: Self-pay | Admitting: Cardiothoracic Surgery

## 2021-12-17 ENCOUNTER — Ambulatory Visit
Admission: RE | Admit: 2021-12-17 | Discharge: 2021-12-17 | Disposition: A | Discharge status: Home / Self Care | Payer: Medicare Other | Source: Ambulatory Visit | Attending: Cardiothoracic Surgery | Admitting: Cardiothoracic Surgery

## 2021-12-17 ENCOUNTER — Encounter: Payer: Self-pay | Admitting: Cardiothoracic Surgery

## 2021-12-17 ENCOUNTER — Other Ambulatory Visit: Payer: Self-pay

## 2021-12-17 VITALS — BP 167/80 | HR 101 | Temp 98.6°F | Resp 20 | Wt 217.0 lb

## 2021-12-17 DIAGNOSIS — Z951 Presence of aortocoronary bypass graft: Secondary | ICD-10-CM

## 2021-12-17 DIAGNOSIS — Z5189 Encounter for other specified aftercare: Secondary | ICD-10-CM

## 2021-12-17 DIAGNOSIS — Z09 Encounter for follow-up examination after completed treatment for conditions other than malignant neoplasm: Secondary | ICD-10-CM

## 2021-12-17 MED ORDER — CLOPIDOGREL BISULFATE 75 MG PO TABS: 75.0000 mg | ORAL_TABLET | Freq: Every day | ORAL | 6 refills | Status: DC

## 2021-12-17 MED ORDER — CEFUROXIME AXETIL 250 MG PO TABS: 500.0000 mg | ORAL_TABLET | Freq: Two times a day (BID) | ORAL | Status: AC

## 2021-12-17 MED FILL — Sodium Chloride IV Soln 0.9%: INTRAVENOUS | Qty: 2000 | Status: AC

## 2021-12-17 MED FILL — Magnesium Sulfate Inj 50%: INTRAMUSCULAR | Qty: 10 | Status: AC

## 2021-12-17 MED FILL — Albumin, Human Inj 5%: INTRAVENOUS | Qty: 250 | Status: AC

## 2021-12-17 MED FILL — Potassium Chloride Inj 2 mEq/ML: INTRAVENOUS | Qty: 40 | Status: AC

## 2021-12-17 MED FILL — Mannitol IV Soln 20%: INTRAVENOUS | Qty: 500 | Status: AC

## 2021-12-17 MED FILL — Lidocaine HCl Local Soln Prefilled Syringe 100 MG/5ML (2%): INTRAMUSCULAR | Qty: 5 | Status: AC

## 2021-12-17 MED FILL — Heparin Sodium (Porcine) Inj 1000 Unit/ML: Qty: 1000 | Status: AC

## 2021-12-17 MED FILL — Electrolyte-R (PH 7.4) Solution: INTRAVENOUS | Qty: 4000 | Status: AC

## 2021-12-17 MED FILL — Sodium Bicarbonate IV Soln 8.4%: INTRAVENOUS | Qty: 50 | Status: AC

## 2021-12-17 NOTE — Progress Notes (Signed)
Nira Conn, RN with [facility] contacted the office stating that patient's wife, Pam contacted her questioning redness, drainage, and temperature of 100. States that she did see a picture of the wound and contacted our office to see if patient should come in. Spoke with Pam over the phone and advised for patient to come into the office today at 12:30 to see Dr. Prescott Gum regarding his incision. She acknowledged receipt. Also advised for patient to get a chest xray before.

## 2021-12-17 NOTE — Progress Notes (Signed)
HPI: Patient was recently discharged after multivessel CABG for acute coronary syndrome.  The patient is overweight with diabetes hypertension and previous mini stroke.  Today he noticed some trace serosanguineous drainage from his lower sternal incision.  He had a low-grade temperature 99-100 degrees.  He presented today for evaluation.  A chest x-ray performed prior to the visit was personally reviewed showing clear lung fields, minimal pleural effusions and sternal wires intact.  No pulmonary infiltrate.  On exam there is some erythema tiny amount of serous drainage from the lower incision.  This was painted with Betadine and a dry dressing was applied.  The leg incisions are healing well.  The upper sternal incision is healing well.  Patient denies any sternal clicking or popping.  Current Outpatient Medications  Medication Sig Dispense Refill   amLODipine (NORVASC) 10 MG tablet TAKE 1 TABLET(10 MG) BY MOUTH DAILY 90 tablet 3   aspirin EC 81 MG tablet Take 1 tablet (81 mg total) by mouth daily. Swallow whole. 90 tablet 3   Blood Pressure Monitoring (BLOOD PRESSURE MONITOR/L CUFF) MISC To monitor blood pressure daily/ has elevated blood pressure readings on medication for hypertension 1 each 0   carbamazepine (TEGRETOL) 200 MG tablet TAKE 1 TABLET(200 MG) BY MOUTH DAILY 90 tablet 2   carvedilol (COREG) 6.25 MG tablet Take 1 tablet (6.25 mg total) by mouth 2 (two) times daily with a meal. 60 tablet 1   cetirizine (ZYRTEC) 10 MG tablet Take 10 mg by mouth daily.     cloNIDine (CATAPRES - DOSED IN MG/24 HR) 0.3 mg/24hr patch Place 1 patch (0.3 mg total) onto the skin once a week. 12 patch 3   clopidogrel (PLAVIX) 75 MG tablet Take 1 tablet (75 mg total) by mouth daily. 30 tablet 6   dapagliflozin propanediol (FARXIGA) 10 MG TABS tablet Take 10 mg by mouth daily before breakfast.     dorzolamide-timolol (COSOPT) 22.3-6.8 MG/ML ophthalmic solution Place 1 drop into the left eye daily.     Fe Fum-Vit  C-Vit B12-FA (TRIGELS-F FORTE) CAPS capsule Take 1 capsule by mouth daily after breakfast. 30 capsule 3   fluticasone (FLONASE) 50 MCG/ACT nasal spray SHAKE LIQUID AND USE 2 SPRAYS IN EACH NOSTRIL DAILY (Patient taking differently: Place 2 sprays into both nostrils daily.) 48 g 1   glucose blood (BAYER CONTOUR TEST) test strip 1 each by Other route daily. 100 each 3   latanoprost (XALATAN) 0.005 % ophthalmic solution Place 1 drop into both eyes at bedtime.  6   olmesartan (BENICAR) 40 MG tablet TAKE 1 TABLET(40 MG) BY MOUTH DAILY 90 tablet 1   oxyCODONE (OXY IR/ROXICODONE) 5 MG immediate release tablet Take 1 tablet (5 mg total) by mouth every 6 (six) hours as needed for up to 7 days for severe pain. 28 tablet 0   pantoprazole (PROTONIX) 40 MG tablet Take 1 tablet (40 mg total) by mouth daily. 30 tablet 1   rosuvastatin (CRESTOR) 40 MG tablet Take 1 tablet (40 mg total) by mouth daily. 90 tablet 3   Saline (ARY NASAL MIST ALLERGY/SINUS NA) Place 1 spray into the nose daily as needed (congestion).     sitaGLIPtin (JANUVIA) 100 MG tablet Take 0.5 tablets (50 mg total) by mouth daily. 45 tablet 3   Current Facility-Administered Medications  Medication Dose Route Frequency Provider Last Rate Last Admin   cefUROXime (CEFTIN) tablet 500 mg  500 mg Oral BID WC [name], MD  Physical Exam: Blood pressure (!) 167/80, pulse (!) 101, temperature 98.6 F (37 C), resp. rate 20, weight 217 lb (98.4 kg), SpO2 97 %.        Exam    General- alert and comfortable.  Slight erythema around the lower sternal incision with scant serosanguineous drops of fluid.  I painted the incision with Betadine and placed a dry 4 x 4 dressing.    Neck- no JVD, no cervical adenopathy palpable, no carotid bruit   Lungs- clear without rales, wheezes   Cor- regular rate and rhythm, no murmur , gallop   Abdomen- soft, non-tender   Extremities - warm, non-tender, minimal edema   Neuro- oriented, appropriate, no  focal weakness   Diagnostic Tests: Chest x-ray today personally reviewed with clear lung fields minimal pleural effusion sternal wires intact  Impression: Cellulitis with scant drainage from the lower sternal incision. He will be started on oral antibiotics-Ceftin 500 mg p.o. twice daily. He will continue to shower with soap and water, pat the incision dry and covered with Betadine and dry gauze.  Plan: Patient  will return for wound check in 72 hours and call us before then  if there are any changes.   [name], MD [facility] [phone]

## 2021-12-19 ENCOUNTER — Other Ambulatory Visit: Payer: Self-pay | Admitting: Cardiothoracic Surgery

## 2021-12-19 MED ORDER — CEFUROXIME AXETIL 250 MG PO TABS: 500.0000 mg | ORAL_TABLET | Freq: Two times a day (BID) | ORAL | Status: AC

## 2021-12-20 ENCOUNTER — Telehealth: Payer: Self-pay | Admitting: *Deleted

## 2021-12-20 ENCOUNTER — Ambulatory Visit (INDEPENDENT_AMBULATORY_CARE_PROVIDER_SITE_OTHER): Payer: Medicare Other | Admitting: Cardiothoracic Surgery

## 2021-12-20 ENCOUNTER — Other Ambulatory Visit: Payer: Self-pay | Admitting: Family Medicine

## 2021-12-20 VITALS — BP 155/91 | HR 102 | Resp 18 | Ht 74.0 in | Wt 215.0 lb

## 2021-12-20 DIAGNOSIS — Z5189 Encounter for other specified aftercare: Secondary | ICD-10-CM

## 2021-12-20 DIAGNOSIS — Z951 Presence of aortocoronary bypass graft: Secondary | ICD-10-CM

## 2021-12-20 DIAGNOSIS — I10 Essential (primary) hypertension: Secondary | ICD-10-CM

## 2021-12-20 MED ORDER — CLONIDINE HCL 0.3 MG PO TABS: 0.3000 mg | ORAL_TABLET | Freq: Two times a day (BID) | ORAL | 0 refills | Status: DC

## 2021-12-20 NOTE — Progress Notes (Signed)
HPI: The patient returns for wound check after presenting 72 hours ago with some erythema and minimal drainage from the lower sternal incision.  He is a 71 year old poorly controlled diabetic.  He was started on some home wound care with Betadine paint and dry 4 x 4 gauze.  He was started on oral cephalosporin-Ceftin 500 mg p.o. twice daily.   The appearance of the wound has improved in the past 3 days with less erythema tenderness and only a few spots of drainage.  Patient's blood pressure is increasing and his home supply of Catapres patch TTS-3 has been displaced while he was hospitalized.  There is an issue with the insurance company refilling the prescription earlier than the usual date.  An appeal can be made to the insurance company for refill override but this will take some time.  I have placed a prescription for Catapres tablets 0.3 mg p.o. twice daily for about 1 week until his usual prescription can be refilled on or about December 15. Current Outpatient Medications  Medication Sig Dispense Refill   amLODipine (NORVASC) 10 MG tablet TAKE 1 TABLET(10 MG) BY MOUTH DAILY 90 tablet 3   aspirin EC 81 MG tablet Take 1 tablet (81 mg total) by mouth daily. Swallow whole. 90 tablet 3   Blood Pressure Monitoring (BLOOD PRESSURE MONITOR/L CUFF) MISC To monitor blood pressure daily/ has elevated blood pressure readings on medication for hypertension 1 each 0   carbamazepine (TEGRETOL) 200 MG tablet TAKE 1 TABLET(200 MG) BY MOUTH DAILY 90 tablet 2   carvedilol (COREG) 6.25 MG tablet Take 1 tablet (6.25 mg total) by mouth 2 (two) times daily with a meal. 60 tablet 1   cetirizine (ZYRTEC) 10 MG tablet Take 10 mg by mouth daily.     clopidogrel (PLAVIX) 75 MG tablet Take 1 tablet (75 mg total) by mouth daily. 30 tablet 6   dapagliflozin propanediol (FARXIGA) 10 MG TABS tablet Take 10 mg by mouth daily before breakfast.     dorzolamide-timolol (COSOPT) 22.3-6.8 MG/ML ophthalmic solution Place 1 drop into  the left eye daily.     Fe Fum-Vit C-Vit B12-FA (TRIGELS-F FORTE) CAPS capsule Take 1 capsule by mouth daily after breakfast. 30 capsule 3   fluticasone (FLONASE) 50 MCG/ACT nasal spray SHAKE LIQUID AND USE 2 SPRAYS IN EACH NOSTRIL DAILY (Patient taking differently: Place 2 sprays into both nostrils daily.) 48 g 1   glucose blood (BAYER CONTOUR TEST) test strip 1 each by Other route daily. 100 each 3   latanoprost (XALATAN) 0.005 % ophthalmic solution Place 1 drop into both eyes at bedtime.  6   olmesartan (BENICAR) 40 MG tablet TAKE 1 TABLET(40 MG) BY MOUTH DAILY 90 tablet 1   oxyCODONE (OXY IR/ROXICODONE) 5 MG immediate release tablet Take 1 tablet (5 mg total) by mouth every 6 (six) hours as needed for up to 7 days for severe pain. 28 tablet 0   pantoprazole (PROTONIX) 40 MG tablet Take 1 tablet (40 mg total) by mouth daily. 30 tablet 1   rosuvastatin (CRESTOR) 40 MG tablet Take 1 tablet (40 mg total) by mouth daily. 90 tablet 3   Saline (ARY NASAL MIST ALLERGY/SINUS NA) Place 1 spray into the nose daily as needed (congestion).     sitaGLIPtin (JANUVIA) 100 MG tablet Take 0.5 tablets (50 mg total) by mouth daily. 45 tablet 3   cloNIDine (CATAPRES - DOSED IN MG/24 HR) 0.3 mg/24hr patch APPLY 1 PATCH(0.3 MG) TOPICALLY TO THE SKIN 1 TIME A  WEEK (Patient not taking: Reported on 12/20/2021) 12 patch 3   Current Facility-Administered Medications  Medication Dose Route Frequency Provider Last Rate Last Admin   cefUROXime (CEFTIN) tablet 500 mg  500 mg Oral BID WC [name], MD       cefUROXime (CEFTIN) tablet 500 mg  500 mg Oral BID WC Enter, Pierre Bali, MD         Physical Exam: Blood pressure (!) 155/91, pulse (!) 102, resp. rate 18, height '6\' 2"'$  (1.88 m), SpO2 96 %.        Exam    General- alert and comfortable lower sternal wound intact without erythema or fluctuance or drainage    Neck- no JVD, no cervical adenopathy palpable, no carotid bruit   Lungs- clear without rales, wheezes    Cor- regular rate and rhythm, no murmur , gallop   Abdomen- soft, non-tender   Extremities - warm, non-tender, minimal edema   Neuro- oriented, appropriate, no focal weakness   Diagnostic Tests: None  Impression: Superficial lower sternal wound cellulitis after multivessel CABG in a 71 year old diabetic.  Wound is responding to oral antibiotic and daily Betadine swab and dry dressing.  Plan: Return for postoperative evaluation and wound check on December 28.   [name], MD [facility] [phone]

## 2021-12-20 NOTE — Telephone Encounter (Signed)
Patient's wife contacted the answering service over the weekend in regards to an antibiotic being sent to patient's pharmacy. Per wife, they were able to pick up the medication yesterday and the patient has begun taking. Confirmed wound check appt for today with patient's wife.

## 2021-12-21 ENCOUNTER — Ambulatory Visit: Payer: Medicare Other

## 2021-12-21 ENCOUNTER — Other Ambulatory Visit: Payer: Self-pay

## 2021-12-21 ENCOUNTER — Ambulatory Visit (INDEPENDENT_AMBULATORY_CARE_PROVIDER_SITE_OTHER): Payer: Medicare Other | Admitting: *Deleted

## 2021-12-21 ENCOUNTER — Emergency Department (HOSPITAL_BASED_OUTPATIENT_CLINIC_OR_DEPARTMENT_OTHER): Payer: Medicare Other

## 2021-12-21 ENCOUNTER — Emergency Department (HOSPITAL_BASED_OUTPATIENT_CLINIC_OR_DEPARTMENT_OTHER)
Admission: EM | Admit: 2021-12-21 | Discharge: 2021-12-21 | Disposition: A | Discharge status: Home / Self Care | Payer: Medicare Other | Attending: Emergency Medicine | Admitting: Emergency Medicine

## 2021-12-21 VITALS — BP 86/56 | HR 91 | Ht 74.0 in | Wt 207.0 lb

## 2021-12-21 DIAGNOSIS — Z79899 Other long term (current) drug therapy: Secondary | ICD-10-CM | POA: Diagnosis not present

## 2021-12-21 DIAGNOSIS — I2510 Atherosclerotic heart disease of native coronary artery without angina pectoris: Secondary | ICD-10-CM | POA: Diagnosis not present

## 2021-12-21 DIAGNOSIS — R55 Syncope and collapse: Secondary | ICD-10-CM | POA: Diagnosis not present

## 2021-12-21 DIAGNOSIS — Z0000 Encounter for general adult medical examination without abnormal findings: Secondary | ICD-10-CM

## 2021-12-21 DIAGNOSIS — Z7982 Long term (current) use of aspirin: Secondary | ICD-10-CM | POA: Diagnosis not present

## 2021-12-21 DIAGNOSIS — I10 Essential (primary) hypertension: Secondary | ICD-10-CM | POA: Diagnosis not present

## 2021-12-21 DIAGNOSIS — Z7984 Long term (current) use of oral hypoglycemic drugs: Secondary | ICD-10-CM | POA: Diagnosis not present

## 2021-12-21 DIAGNOSIS — R600 Localized edema: Secondary | ICD-10-CM | POA: Diagnosis not present

## 2021-12-21 DIAGNOSIS — Z91040 Latex allergy status: Secondary | ICD-10-CM | POA: Insufficient documentation

## 2021-12-21 DIAGNOSIS — Z7902 Long term (current) use of antithrombotics/antiplatelets: Secondary | ICD-10-CM | POA: Diagnosis not present

## 2021-12-21 DIAGNOSIS — J9811 Atelectasis: Secondary | ICD-10-CM | POA: Diagnosis not present

## 2021-12-21 DIAGNOSIS — I952 Hypotension due to drugs: Secondary | ICD-10-CM | POA: Diagnosis not present

## 2021-12-21 DIAGNOSIS — I959 Hypotension, unspecified: Secondary | ICD-10-CM | POA: Diagnosis not present

## 2021-12-21 DIAGNOSIS — R61 Generalized hyperhidrosis: Secondary | ICD-10-CM | POA: Insufficient documentation

## 2021-12-21 DIAGNOSIS — E119 Type 2 diabetes mellitus without complications: Secondary | ICD-10-CM | POA: Diagnosis not present

## 2021-12-21 LAB — COMPREHENSIVE METABOLIC PANEL
ALT: 23 U/L (ref 0–44)
AST: 26 U/L (ref 15–41)
Albumin: 3.5 g/dL (ref 3.5–5.0)
Alkaline Phosphatase: 71 U/L (ref 38–126)
Anion gap: 8 (ref 5–15)
BUN: 16 mg/dL (ref 8–23)
CO2: 24 mmol/L (ref 22–32)
Calcium: 8.9 mg/dL (ref 8.9–10.3)
Chloride: 109 mmol/L (ref 98–111)
Creatinine, Ser: 1.38 mg/dL — ABNORMAL HIGH (ref 0.61–1.24)
GFR, Estimated: 55 mL/min — ABNORMAL LOW (ref >60)
Glucose, Bld: 233 mg/dL — ABNORMAL HIGH (ref 70–99)
Potassium: 3.7 mmol/L (ref 3.5–5.1)
Sodium: 141 mmol/L (ref 135–145)
Total Bilirubin: 0.4 mg/dL (ref 0.3–1.2)
Total Protein: 7.8 g/dL (ref 6.5–8.1)

## 2021-12-21 LAB — PROTIME-INR
INR: 1.2 (ref 0.8–1.2)
Prothrombin Time: 15.3 seconds — ABNORMAL HIGH (ref 11.4–15.2)

## 2021-12-21 LAB — CBC WITH DIFFERENTIAL/PLATELET
Abs Immature Granulocytes: 0.04 10*3/uL (ref 0.00–0.07)
Basophils Absolute: 0 10*3/uL (ref 0.0–0.1)
Basophils Relative: 0 %
Eosinophils Absolute: 0.2 10*3/uL (ref 0.0–0.5)
Eosinophils Relative: 2 %
HCT: 32.9 % — ABNORMAL LOW (ref 39.0–52.0)
Hemoglobin: 10.1 g/dL — ABNORMAL LOW (ref 13.0–17.0)
Immature Granulocytes: 0 %
Lymphocytes Relative: 17 %
Lymphs Abs: 1.5 10*3/uL (ref 0.7–4.0)
MCH: 28.2 pg (ref 26.0–34.0)
MCHC: 30.7 g/dL (ref 30.0–36.0)
MCV: 91.9 fL (ref 80.0–100.0)
Monocytes Absolute: 0.7 10*3/uL (ref 0.1–1.0)
Monocytes Relative: 8 %
Neutro Abs: 6.5 10*3/uL (ref 1.7–7.7)
Neutrophils Relative %: 73 %
Platelets: 407 10*3/uL — ABNORMAL HIGH (ref 150–400)
RBC: 3.58 MIL/uL — ABNORMAL LOW (ref 4.22–5.81)
RDW: 13.7 % (ref 11.5–15.5)
WBC: 8.9 10*3/uL (ref 4.0–10.5)
nRBC: 0 % (ref 0.0–0.2)

## 2021-12-21 LAB — APTT: aPTT: 32 seconds (ref 24–36)

## 2021-12-21 MED ORDER — SODIUM CHLORIDE 0.9 % IV BOLUS
500.0000 mL | Freq: Once | INTRAVENOUS | Status: AC
  Administered 2021-12-21: 500 mL via INTRAVENOUS

## 2021-12-21 NOTE — ED Notes (Signed)
Patient ambulated around the unit. Oxygen stayed between 97-100% with heart rate at 102. Pt reported no dizziness when walking.  RN notified.

## 2021-12-21 NOTE — ED Provider Notes (Signed)
[facility] EMERGENCY DEPARTMENT Provider Note   CSN: [id number] Arrival date & time: 12/21/21  1121     History  Chief Complaint  Patient presents with   Hypotension   Dizziness    Andrew [name] is a 71 y.o. male.  Is a 71 year old male with a history of diabetes, hypertension, hyperlipidemia, CAD status post double bypass on 12/07/2021 who has been home from the hospital for approximately 1 week, stroke, seizures, GERD who is presenting today from his PCPs office due to hypotension and vomiting.  Patient reports yesterday was a normal day he felt fine throughout the day has not had any recent chest pain or shortness of breath.  When he woke up this morning he felt normal.  Reports he ate breakfast and then took all of his medications.  He reports while he started to take a shower he was feeling a little bit lightheaded.  When he got to the doctor's office his lightheadedness had worsened and when they checked his blood pressure upstairs it was 80/60 and then 60/40 and he had 1 episode of emesis.  He denied having any shortness of breath or chest pain during this episode.  He has not had any recent fever or cough.  He reports now he feels better after having some juice.  He reports having occasional dark stools but the last when he noticed was on Saturday and he has had light brown stool Sunday and Monday.  His family member who is present in the room reports that they misplaced his clonidine patches which was 0.3 mg and so instead he has started taking clonidine tablets 0.1 mg.  He took 1 last night and then another 1 this morning.  It was after taking this medication that he started feeling unwell.  He denies prior history of GI bleeding, syncope.  Reports his blood pressure at home is typically running 144 systolic.  The history is provided by the patient, a relative and medical records.  Dizziness      Home Medications Prior to Admission medications   Medication Sig  Start Date End Date Taking? Authorizing Provider  amLODipine (NORVASC) 10 MG tablet TAKE 1 TABLET(10 MG) BY MOUTH DAILY 09/06/21   Fay Records, MD  aspirin EC 81 MG tablet Take 1 tablet (81 mg total) by mouth daily. Swallow whole. 11/09/21   Burnell Blanks, MD  Blood Pressure Monitoring (BLOOD PRESSURE MONITOR/L CUFF) MISC To monitor blood pressure daily/ has elevated blood pressure readings on medication for hypertension 11/05/13   [name], MD  carbamazepine (TEGRETOL) 200 MG tablet TAKE 1 TABLET(200 MG) BY MOUTH DAILY 07/19/21   Dohmeier, Asencion Partridge, MD  carvedilol (COREG) 6.25 MG tablet Take 1 tablet (6.25 mg total) by mouth 2 (two) times daily with a meal. 12/14/21   Gold, Wayne E, PA-C  cetirizine (ZYRTEC) 10 MG tablet Take 10 mg by mouth daily.    [provider]  cloNIDine (CATAPRES - DOSED IN MG/24 HR) 0.3 mg/24hr patch APPLY 1 PATCH(0.3 MG) TOPICALLY TO THE SKIN 1 TIME A WEEK Patient not taking: Reported on 12/20/2021 12/20/21   Copland, Gay Filler, MD  cloNIDine (CATAPRES) 0.3 MG tablet Take 1 tablet (0.3 mg total) by mouth 2 (two) times daily for 7 days. 12/20/21 12/27/21  [name], MD  clopidogrel (PLAVIX) 75 MG tablet Take 1 tablet (75 mg total) by mouth daily. 12/17/21   [name], MD  dapagliflozin propanediol (FARXIGA) 10 MG TABS tablet Take 10 mg  by mouth daily before breakfast.    [provider]  dorzolamide-timolol (COSOPT) 22.3-6.8 MG/ML ophthalmic solution Place 1 drop into the left eye daily. 04/08/13   [provider]  Fe Fum-Vit C-Vit B12-FA (TRIGELS-F FORTE) CAPS capsule Take 1 capsule by mouth daily after breakfast. 12/14/21   Gold, Patrick Jupiter E, PA-C  fluticasone (FLONASE) 50 MCG/ACT nasal spray SHAKE LIQUID AND USE 2 SPRAYS IN EACH NOSTRIL DAILY Patient taking differently: Place 2 sprays into both nostrils daily. 07/15/21   Copland, Gay Filler, MD  glucose blood (BAYER CONTOUR TEST) test strip 1 each by Other route daily. 10/19/15    Renato Shin, MD  latanoprost (XALATAN) 0.005 % ophthalmic solution Place 1 drop into both eyes at bedtime. 04/24/17   [provider]  olmesartan (BENICAR) 40 MG tablet TAKE 1 TABLET(40 MG) BY MOUTH DAILY 09/06/21   Copland, Gay Filler, MD  oxyCODONE (OXY IR/ROXICODONE) 5 MG immediate release tablet Take 1 tablet (5 mg total) by mouth every 6 (six) hours as needed for up to 7 days for severe pain. 12/14/21 12/21/21  Gold, Wilder Glade, PA-C  pantoprazole (PROTONIX) 40 MG tablet Take 1 tablet (40 mg total) by mouth daily. 12/14/21   Gold, Wayne E, PA-C  rosuvastatin (CRESTOR) 40 MG tablet Take 1 tablet (40 mg total) by mouth daily. 09/01/21   Fay Records, MD  Saline (ARY NASAL MIST ALLERGY/SINUS NA) Place 1 spray into the nose daily as needed (congestion).    [provider]  sitaGLIPtin (JANUVIA) 100 MG tablet Take 0.5 tablets (50 mg total) by mouth daily. 04/29/21   Renato Shin, MD      Allergies    Adhesive [tape], Latex, Aspirin, Ether, Hydrocodone, Lexapro [escitalopram oxalate], and Other    Review of Systems   Review of Systems  Neurological:  Positive for dizziness.    Physical Exam Updated Vital Signs BP 132/85   Pulse 91   Temp 97.9 F (36.6 C)   Resp (!) 26   SpO2 99%  Physical Exam Vitals and nursing note reviewed.  Constitutional:      General: He is not in acute distress.    Appearance: He is well-developed. He is diaphoretic.  HENT:     Head: Normocephalic and atraumatic.     Mouth/Throat:     Mouth: Mucous membranes are moist.  Eyes:     Pupils: Pupils are equal, round, and reactive to light.     Comments: Pale conjunctive.  Pupil is equal round and reactive on the left, eye patch present on the right  Cardiovascular:     Rate and Rhythm: Normal rate and regular rhythm.     Pulses: Normal pulses.     Heart sounds: No murmur heard. Pulmonary:     Effort: Pulmonary effort is normal. No respiratory distress.     Breath sounds: Normal breath sounds.  No wheezing or rales.     Comments: Midline sternotomy scar appears to be healing with healing ecchymosis over the entire chest and upper abdomen Abdominal:     General: There is no distension.     Palpations: Abdomen is soft.     Tenderness: There is no abdominal tenderness. There is no guarding or rebound.  Musculoskeletal:        General: No tenderness. Normal range of motion.     Cervical back: Normal range of motion and neck supple.     Right lower leg: Edema present.     Left lower leg: Edema present.  Comments: 1+ edema bilateral lower extremities  Skin:    General: Skin is warm.     Coloration: Skin is pale.     Findings: No erythema or rash.  Neurological:     Mental Status: He is alert and oriented to person, place, and time. Mental status is at baseline.     Sensory: No sensory deficit.     Motor: No weakness.     Gait: Gait normal.  Psychiatric:        Mood and Affect: Mood normal.        Behavior: Behavior normal.     ED Results / Procedures / Treatments   Labs (all labs ordered are listed, but only abnormal results are displayed) Labs Reviewed  CBC WITH DIFFERENTIAL/PLATELET - Abnormal; Notable for the following components:      Result Value   RBC 3.58 (*)    Hemoglobin 10.1 (*)    HCT 32.9 (*)    Platelets 407 (*)    All other components within normal limits  COMPREHENSIVE METABOLIC PANEL - Abnormal; Notable for the following components:   Glucose, Bld 233 (*)    Creatinine, Ser 1.38 (*)    GFR, Estimated 55 (*)    All other components within normal limits  PROTIME-INR - Abnormal; Notable for the following components:   Prothrombin Time 15.3 (*)    All other components within normal limits  APTT    EKG EKG Interpretation  Date/Time:  Tuesday December 21 2021 13:11:32 EST Ventricular Rate:  77 PR Interval:  176 QRS Duration: 95 QT Interval:  428 QTC Calculation: 485 R Axis:   62 Text Interpretation: Sinus rhythm Abnormal R-wave progression,  early transition Abnrm T, consider ischemia, anterolateral lds No significant change since last tracing Confirmed by Blanchie Dessert [phone]) on 12/21/2021 1:51:40 PM  Radiology DG Chest Port 1 View  Result Date: 12/21/2021 CLINICAL DATA:  Near syncope EXAM: PORTABLE CHEST 1 VIEW COMPARISON:  12/17/2021 FINDINGS: The heart size and cardiac silhouette is prominent. Linear opacities spaces consistent with scarring or atelectasis. Normal pulmonary vasculature. Calcified aorta. Median sternotomy wires and prior CABG. No pneumothorax or pleural effusion. IMPRESSION: Dependent bibasilar subsegmental Atelectasis. Otherwise acute cardiopulmonary process. Electronically Signed   By: Sammie Bench M.D.   On: 12/21/2021 12:25    Procedures Procedures    Medications Ordered in ED Medications  sodium chloride 0.9 % bolus 500 mL (0 mLs Intravenous Stopped 12/21/21 1357)    ED Course/ Medical Decision Making/ A&P                           Medical Decision Making Amount and/or Complexity of Data Reviewed External Data Reviewed: notes.    Details: Recent hospitalization Labs: ordered. Decision-making details documented in ED Course. Radiology: ordered and independent interpretation performed. Decision-making details documented in ED Course. ECG/medicine tests: ordered and independent interpretation performed. Decision-making details documented in ED Course.   Pt with multiple medical problems and comorbidities and presenting today with a complaint that caries a high risk for morbidity and mortality.  Here today with hypotension, near syncope from PCPs office upstairs.  Patient has recently had open heart surgery with a double bypass but has been doing well at home.  Of note he did have to switch his clonidine to oral because the patches were misplaced.  He typically is on 0.3 mg patch but last had that on Friday.  He started taking oral clonidine last night and  again this morning preceding his  symptoms.  He denies any infectious symptoms or chest pain.  On exam he is mildly pale but was able to stand, mentate normally and walk to the bed without recurrence of his symptoms.  Concern for anemia, electrolyte abnormality, dehydration or medication side effects from the clonidine.  Patient's blood pressure here has been normal.  I independently interpreted patient's EKG and labs today.  EKG does show changes with new T wave inversions laterally and nonspecific ST elevation in leads III only which is different from his prior tracings however EKG is new since surgery and repeat EKG is unchanged.  CBC with stable hemoglobin of 10, normal white count and platelets, CMP within normal limits except mild bump in creatinine to 1.38 from 1.1 prior PT/INR without acute abnormality.  I have independently visualized and interpreted pt's images today.  Within normal limits.  Patient given a 500 mL bolus.  He remains asymptomatic.  Will check orthostatics and see how patient feels getting up and moving around.  Low suspicion at this time for an acute occlusion of his graft.  He again has no complaints at this time and symptoms have passed.  Suspect result of his clonidine.  He has had normal heart rate as he has been on continuous cardiac monitoring without dysrhythmia.  2:46 PM Patient's blood pressure has been stable.  He was able to stand and walk around without recurrence of symptoms.  He still denies any shortness of breath or chest pain.  Findings discussed with the patient and his family member.  At this time we will have him continue his Norvasc, carvedilol and olmesartan but will have him avoid taking further oral clonidine.  His insurance will pay for a new patch on Friday unable to obtain a patch here to give him.  This was discussed with the patient and his family member.  They will continue this plan and return for any worsening symptoms.  No indication for admission at this time.          Final  Clinical Impression(s) / ED Diagnoses Final diagnoses:  Hypotension due to drugs    Rx / DC Orders ED Discharge Orders     None         Blanchie Dessert, MD 12/21/21 1447

## 2021-12-21 NOTE — Patient Instructions (Signed)
Andrew [name] , Thank you for taking time to come for your Medicare Wellness Visit. I appreciate your ongoing commitment to your health goals. Please review the following plan we discussed and let me know if I can assist you in the future.   These are the goals we discussed:  Goals      Patient Stated     Maintain or improve current health        This is a list of the screening recommended for you and due dates:  Health Maintenance  Topic Date Due   Zoster (Shingles) Vaccine (2 of 2) 06/22/2016   Eye exam for diabetics  04/27/2021   Complete foot exam   09/02/2021   COVID-19 Vaccine (7 - 2023-24 season) 09/11/2021   DTaP/Tdap/Td vaccine (2 - Td or Tdap) 03/06/2022   Hemoglobin A1C  06/06/2022   Yearly kidney health urinalysis for diabetes  10/01/2022   Yearly kidney function blood test for diabetes  12/15/2022   Medicare Annual Wellness Visit  12/22/2022   Colon Cancer Screening  11/21/2026   Pneumonia Vaccine  Completed   Flu Shot  Completed   Hepatitis C Screening: USPSTF Recommendation to screen - Ages 18-79 yo.  Completed   HPV Vaccine  Aged Out     Next appointment: Follow up in one year for your annual wellness visit.   Preventive Care 70 Years and Older, Male Preventive care refers to lifestyle choices and visits with your health care provider that can promote health and wellness. What does preventive care include? A yearly physical exam. This is also called an annual well check. Dental exams once or twice a year. Routine eye exams. Ask your health care provider how often you should have your eyes checked. Personal lifestyle choices, including: Daily care of your teeth and gums. Regular physical activity. Eating a healthy diet. Avoiding tobacco and drug use. Limiting alcohol use. Practicing safe sex. Taking low doses of aspirin every day. Taking vitamin and mineral supplements as recommended by your health care provider. What happens during an annual well  check? The services and screenings done by your health care provider during your annual well check will depend on your age, overall health, lifestyle risk factors, and family history of disease. Counseling  Your health care provider may ask you questions about your: Alcohol use. Tobacco use. Drug use. Emotional well-being. Home and relationship well-being. Sexual activity. Eating habits. History of falls. Memory and ability to understand (cognition). Work and work Statistician. Screening  You may have the following tests or measurements: Height, weight, and BMI. Blood pressure. Lipid and cholesterol levels. These may be checked every 5 years, or more frequently if you are over 70 years old. Skin check. Lung cancer screening. You may have this screening every year starting at age 70 if you have a 30-pack-year history of smoking and currently smoke or have quit within the past 15 years. Fecal occult blood test (FOBT) of the stool. You may have this test every year starting at age 70. Flexible sigmoidoscopy or colonoscopy. You may have a sigmoidoscopy every 5 years or a colonoscopy every 10 years starting at age 70. Prostate cancer screening. Recommendations will vary depending on your family history and other risks. Hepatitis C blood test. Hepatitis B blood test. Sexually transmitted disease (STD) testing. Diabetes screening. This is done by checking your blood sugar (glucose) after you have not eaten for a while (fasting). You may have this done every 1-3 years. Abdominal aortic aneurysm (AAA) screening.  You may need this if you are a current or former smoker. Osteoporosis. You may be screened starting at age 70 if you are at high risk. Talk with your health care provider about your test results, treatment options, and if necessary, the need for more tests. Vaccines  Your health care provider may recommend certain vaccines, such as: Influenza vaccine. This is recommended every  year. Tetanus, diphtheria, and acellular pertussis (Tdap, Td) vaccine. You may need a Td booster every 10 years. Zoster vaccine. You may need this after age 70. Pneumococcal 13-valent conjugate (PCV13) vaccine. One dose is recommended after age 29. Pneumococcal polysaccharide (PPSV23) vaccine. One dose is recommended after age 20. Talk to your health care provider about which screenings and vaccines you need and how often you need them. This information is not intended to replace advice given to you by your health care provider. Make sure you discuss any questions you have with your health care provider. Document Released: 01/23/2015 Document Revised: 09/16/2015 Document Reviewed: 10/28/2014 Elsevier Interactive Patient Education  2017 [place] Prevention in the Home Falls can cause injuries. They can happen to people of all ages. There are many things you can do to make your home safe and to help prevent falls. What can I do on the outside of my home? Regularly fix the edges of walkways and driveways and fix any cracks. Remove anything that might make you trip as you walk through a door, such as a raised step or threshold. Trim any bushes or trees on the path to your home. Use bright outdoor lighting. Clear any walking paths of anything that might make someone trip, such as rocks or tools. Regularly check to see if handrails are loose or broken. Make sure that both sides of any steps have handrails. Any raised decks and porches should have guardrails on the edges. Have any leaves, snow, or ice cleared regularly. Use sand or salt on walking paths during winter. Clean up any spills in your garage right away. This includes oil or grease spills. What can I do in the bathroom? Use night lights. Install grab bars by the toilet and in the tub and shower. Do not use towel bars as grab bars. Use non-skid mats or decals in the tub or shower. If you need to sit down in the shower, use a  plastic, non-slip stool. Keep the floor dry. Clean up any water that spills on the floor as soon as it happens. Remove soap buildup in the tub or shower regularly. Attach bath mats securely with double-sided non-slip rug tape. Do not have throw rugs and other things on the floor that can make you trip. What can I do in the bedroom? Use night lights. Make sure that you have a light by your bed that is easy to reach. Do not use any sheets or blankets that are too big for your bed. They should not hang down onto the floor. Have a firm chair that has side arms. You can use this for support while you get dressed. Do not have throw rugs and other things on the floor that can make you trip. What can I do in the kitchen? Clean up any spills right away. Avoid walking on wet floors. Keep items that you use a lot in easy-to-reach places. If you need to reach something above you, use a strong step stool that has a grab bar. Keep electrical cords out of the way. Do not use floor polish or wax  that makes floors slippery. If you must use wax, use non-skid floor wax. Do not have throw rugs and other things on the floor that can make you trip. What can I do with my stairs? Do not leave any items on the stairs. Make sure that there are handrails on both sides of the stairs and use them. Fix handrails that are broken or loose. Make sure that handrails are as long as the stairways. Check any carpeting to make sure that it is firmly attached to the stairs. Fix any carpet that is loose or worn. Avoid having throw rugs at the top or bottom of the stairs. If you do have throw rugs, attach them to the floor with carpet tape. Make sure that you have a light switch at the top of the stairs and the bottom of the stairs. If you do not have them, ask someone to add them for you. What else can I do to help prevent falls? Wear shoes that: Do not have high heels. Have rubber bottoms. Are comfortable and fit you  well. Are closed at the toe. Do not wear sandals. If you use a stepladder: Make sure that it is fully opened. Do not climb a closed stepladder. Make sure that both sides of the stepladder are locked into place. Ask someone to hold it for you, if possible. Clearly mark and make sure that you can see: Any grab bars or handrails. First and last steps. Where the edge of each step is. Use tools that help you move around (mobility aids) if they are needed. These include: Canes. Walkers. Scooters. Crutches. Turn on the lights when you go into a dark area. Replace any light bulbs as soon as they burn out. Set up your furniture so you have a clear path. Avoid moving your furniture around. If any of your floors are uneven, fix them. If there are any pets around you, be aware of where they are. Review your medicines with your doctor. Some medicines can make you feel dizzy. This can increase your chance of falling. Ask your doctor what other things that you can do to help prevent falls. This information is not intended to replace advice given to you by your health care provider. Make sure you discuss any questions you have with your health care provider. Document Released: 10/23/2008 Document Revised: 06/04/2015 Document Reviewed: 01/31/2014 Elsevier Interactive Patient Education  2017 [employer].

## 2021-12-21 NOTE — ED Triage Notes (Signed)
Pt was at appointment in building and blood pressure was 86/56 and 68/48. Pt had a dizzy spell and vomited one time. Pt was given juice and states that he is feeling better since throwing up. 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

## 2021-12-21 NOTE — Discharge Instructions (Signed)
The low blood pressure today was most likely caused by the clonidine.  At this point just take your other blood pressure medications and avoid the oral clonidine.  Start the patch again on Friday.  If you start having shortness of breath, chest pain, worsening dizziness even with drinking some fluid and sitting down and resting return to the emergency room.  All blood work and x-rays today look normal.
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
## 2021-12-22 DIAGNOSIS — I25119 Atherosclerotic heart disease of native coronary artery with unspecified angina pectoris: Secondary | ICD-10-CM | POA: Diagnosis not present

## 2021-12-22 DIAGNOSIS — Z48812 Encounter for surgical aftercare following surgery on the circulatory system: Secondary | ICD-10-CM | POA: Diagnosis not present

## 2021-12-22 DIAGNOSIS — Z7984 Long term (current) use of oral hypoglycemic drugs: Secondary | ICD-10-CM | POA: Diagnosis not present

## 2021-12-22 DIAGNOSIS — I119 Hypertensive heart disease without heart failure: Secondary | ICD-10-CM | POA: Diagnosis not present

## 2021-12-22 DIAGNOSIS — E119 Type 2 diabetes mellitus without complications: Secondary | ICD-10-CM | POA: Diagnosis not present

## 2021-12-22 DIAGNOSIS — Z8673 Personal history of transient ischemic attack (TIA), and cerebral infarction without residual deficits: Secondary | ICD-10-CM | POA: Diagnosis not present

## 2021-12-22 LAB — ECHO INTRAOPERATIVE TEE
AR max vel: 2.61 cm2
AV Area VTI: 1.98 cm2
AV Area mean vel: 2.49 cm2
AV Mean grad: 3 mmHg
AV Peak grad: 5.4 mmHg
Ao pk vel: 1.16 m/s
Height: 74 in
MV VTI: 3.84 cm2
Weight: 3552 oz

## 2021-12-24 DIAGNOSIS — Z8673 Personal history of transient ischemic attack (TIA), and cerebral infarction without residual deficits: Secondary | ICD-10-CM | POA: Diagnosis not present

## 2021-12-24 DIAGNOSIS — Z7984 Long term (current) use of oral hypoglycemic drugs: Secondary | ICD-10-CM | POA: Diagnosis not present

## 2021-12-24 DIAGNOSIS — I119 Hypertensive heart disease without heart failure: Secondary | ICD-10-CM | POA: Diagnosis not present

## 2021-12-24 DIAGNOSIS — I25119 Atherosclerotic heart disease of native coronary artery with unspecified angina pectoris: Secondary | ICD-10-CM | POA: Diagnosis not present

## 2021-12-24 DIAGNOSIS — E119 Type 2 diabetes mellitus without complications: Secondary | ICD-10-CM | POA: Diagnosis not present

## 2021-12-24 DIAGNOSIS — Z48812 Encounter for surgical aftercare following surgery on the circulatory system: Secondary | ICD-10-CM | POA: Diagnosis not present

## 2021-12-27 ENCOUNTER — Telehealth: Payer: Self-pay | Admitting: *Deleted

## 2021-12-27 DIAGNOSIS — E119 Type 2 diabetes mellitus without complications: Secondary | ICD-10-CM | POA: Diagnosis not present

## 2021-12-27 DIAGNOSIS — Z8673 Personal history of transient ischemic attack (TIA), and cerebral infarction without residual deficits: Secondary | ICD-10-CM | POA: Diagnosis not present

## 2021-12-27 DIAGNOSIS — Z7984 Long term (current) use of oral hypoglycemic drugs: Secondary | ICD-10-CM | POA: Diagnosis not present

## 2021-12-27 DIAGNOSIS — I25119 Atherosclerotic heart disease of native coronary artery with unspecified angina pectoris: Secondary | ICD-10-CM | POA: Diagnosis not present

## 2021-12-27 DIAGNOSIS — Z48812 Encounter for surgical aftercare following surgery on the circulatory system: Secondary | ICD-10-CM | POA: Diagnosis not present

## 2021-12-27 DIAGNOSIS — I119 Hypertensive heart disease without heart failure: Secondary | ICD-10-CM | POA: Diagnosis not present

## 2021-12-27 NOTE — Telephone Encounter (Signed)
Patient's HH PT, Andrew [name], contacted the office stating patient's therapy today will be cancelled d/t a HR of 117. Patient contacted. Per patient, he is feeling fine. Patient states he is taking his medications, however, he took all his BP meds late this morning. Patient states prior to PT arriving, he was getting cleaned up which takes a considerable amount of effort due to his chest incision. Patient states his BP is ranging in the 140/70's. Patient states he is consuming fluids but his appetite is lacking. Patient states he is trying to do better about eating. Advised patient to keep a log of his VS and weight. Patient instructed to call if tachycardia persists. Patient verbalized understanding.

## 2021-12-28 DIAGNOSIS — Z48812 Encounter for surgical aftercare following surgery on the circulatory system: Secondary | ICD-10-CM | POA: Diagnosis not present

## 2021-12-28 DIAGNOSIS — Z7984 Long term (current) use of oral hypoglycemic drugs: Secondary | ICD-10-CM | POA: Diagnosis not present

## 2021-12-28 DIAGNOSIS — I119 Hypertensive heart disease without heart failure: Secondary | ICD-10-CM | POA: Diagnosis not present

## 2021-12-28 DIAGNOSIS — E119 Type 2 diabetes mellitus without complications: Secondary | ICD-10-CM | POA: Diagnosis not present

## 2021-12-28 DIAGNOSIS — Z8673 Personal history of transient ischemic attack (TIA), and cerebral infarction without residual deficits: Secondary | ICD-10-CM | POA: Diagnosis not present

## 2021-12-28 DIAGNOSIS — I25119 Atherosclerotic heart disease of native coronary artery with unspecified angina pectoris: Secondary | ICD-10-CM | POA: Diagnosis not present

## 2021-12-29 DIAGNOSIS — E119 Type 2 diabetes mellitus without complications: Secondary | ICD-10-CM | POA: Diagnosis not present

## 2021-12-29 DIAGNOSIS — Z7984 Long term (current) use of oral hypoglycemic drugs: Secondary | ICD-10-CM | POA: Diagnosis not present

## 2021-12-29 DIAGNOSIS — I119 Hypertensive heart disease without heart failure: Secondary | ICD-10-CM | POA: Diagnosis not present

## 2021-12-29 DIAGNOSIS — Z48812 Encounter for surgical aftercare following surgery on the circulatory system: Secondary | ICD-10-CM | POA: Diagnosis not present

## 2021-12-29 DIAGNOSIS — Z8673 Personal history of transient ischemic attack (TIA), and cerebral infarction without residual deficits: Secondary | ICD-10-CM | POA: Diagnosis not present

## 2021-12-29 DIAGNOSIS — I25119 Atherosclerotic heart disease of native coronary artery with unspecified angina pectoris: Secondary | ICD-10-CM | POA: Diagnosis not present

## 2022-01-06 ENCOUNTER — Ambulatory Visit (INDEPENDENT_AMBULATORY_CARE_PROVIDER_SITE_OTHER): Payer: Medicare Other | Admitting: Physician Assistant

## 2022-01-06 ENCOUNTER — Encounter: Payer: Self-pay | Admitting: Physician Assistant

## 2022-01-06 VITALS — BP 129/78 | HR 100 | Resp 20 | Ht 74.0 in | Wt 207.0 lb

## 2022-01-06 DIAGNOSIS — Z09 Encounter for follow-up examination after completed treatment for conditions other than malignant neoplasm: Secondary | ICD-10-CM

## 2022-01-06 DIAGNOSIS — I2510 Atherosclerotic heart disease of native coronary artery without angina pectoris: Secondary | ICD-10-CM

## 2022-01-06 DIAGNOSIS — Z951 Presence of aortocoronary bypass graft: Secondary | ICD-10-CM
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
## 2022-01-06 NOTE — Progress Notes (Signed)
[address]       [place] [address]             [phone]       History of Present Illness: [name] is a 71 year old gentleman with past history of type 2 diabetes mellitus, cerebrovascular disease with mild stroke versus TIA and subsequent seizure disorder 5 years ago, hypertension, and dyslipidemia.  He was referred to [place] a few months ago for management of significant coronary artery disease.  He was admitted for elective surgery on 12/08/2021 and had CABG x 2 by Dr. Darcey Nora.  He had an uncomplicated postoperative course and was discharged home on 12/14/2021.  He was subsequently seen in the office 3 days later for erythema at the lower end of his sternal incision.  He was felt to have early cellulitis that was treated with local wound care, dressing changes, and oral Ceftin 500 mg p.o. twice daily.  He was seen in follow-up 3 days later and showing significant improvement in the wound.  No additional intervention was undertaken but he was asked to return today for scheduled postoperative follow-up and wound check.  [name] is accompanied by his wife today.  He reports no new problems except for some constipation.  He has had no chest pain, shortness of breath, or palpitations.   Current Outpatient Medications  Medication Sig Dispense Refill   amLODipine (NORVASC) 10 MG tablet TAKE 1 TABLET(10 MG) BY MOUTH DAILY 90 tablet 3   aspirin EC 81 MG tablet Take 1 tablet (81 mg total) by mouth daily. Swallow whole. 90 tablet 3   Blood Pressure Monitoring (BLOOD PRESSURE MONITOR/L CUFF) MISC To monitor blood pressure daily/ has elevated blood pressure readings on medication for hypertension 1 each 0   carbamazepine (TEGRETOL) 200 MG tablet TAKE 1 TABLET(200 MG) BY MOUTH DAILY 90 tablet 2   carvedilol (COREG) 6.25 MG tablet Take 1 tablet (6.25 mg total) by mouth 2 (two) times daily with a meal. 60 tablet 1   cetirizine (ZYRTEC) 10 MG tablet Take 10 mg by mouth daily.      cloNIDine (CATAPRES - DOSED IN MG/24 HR) 0.3 mg/24hr patch APPLY 1 PATCH(0.3 MG) TOPICALLY TO THE SKIN 1 TIME A WEEK (Patient not taking: Reported on 12/20/2021) 12 patch 3   cloNIDine (CATAPRES) 0.3 MG tablet Take 1 tablet (0.3 mg total) by mouth 2 (two) times daily for 7 days. 14 tablet 0   clopidogrel (PLAVIX) 75 MG tablet Take 1 tablet (75 mg total) by mouth daily. 30 tablet 6   dapagliflozin propanediol (FARXIGA) 10 MG TABS tablet Take 10 mg by mouth daily before breakfast.     dorzolamide-timolol (COSOPT) 22.3-6.8 MG/ML ophthalmic solution Place 1 drop into the left eye daily.     Fe Fum-Vit C-Vit B12-FA (TRIGELS-F FORTE) CAPS capsule Take 1 capsule by mouth daily after breakfast. 30 capsule 3   fluticasone (FLONASE) 50 MCG/ACT nasal spray SHAKE LIQUID AND USE 2 SPRAYS IN EACH NOSTRIL DAILY (Patient taking differently: Place 2 sprays into both nostrils daily.) 48 g 1   glucose blood (BAYER CONTOUR TEST) test strip 1 each by Other route daily. 100 each 3   latanoprost (XALATAN) 0.005 % ophthalmic solution Place 1 drop into both eyes at bedtime.  6   olmesartan (BENICAR) 40 MG tablet TAKE 1 TABLET(40 MG) BY MOUTH DAILY 90 tablet 1   pantoprazole (PROTONIX) 40 MG tablet Take 1 tablet (40 mg total) by mouth daily. [place]  tablet 1   rosuvastatin (CRESTOR) 40 MG tablet Take 1 tablet (40 mg total) by mouth daily. 90 tablet 3   Saline (ARY NASAL MIST ALLERGY/SINUS NA) Place 1 spray into the nose daily as needed (congestion).     sitaGLIPtin (JANUVIA) 100 MG tablet Take 0.5 tablets (50 mg total) by mouth daily. 45 tablet 3   No current facility-administered medications for this visit.    Physical Exam Vital signs BP 129/78 Pulse 100 Respirations 20 SpO2 97% on room air  General: [name] is in good spirits.  He is walking with with a cane. Heart: Regular rate and rhythm, no murmur. Chest: The sternotomy incision is healing nicely.  There is no erythema or drainage.  There are some crusty  scabs on the lower half of the sternotomy incision but it is entirely intact.  Breath sounds are clear to auscultation and are full and equal. Extremities: No peripheral edema.  The right lower extremity EVH incisions also have crusty scabs but no drainage, induration, or erythema.  Diagnostic Tests: None today  Impression / Plan: [name] is continuing to progress following CABG x 2.  Medications were reviewed and no changes are indicated from CT surgery standpoint.  The erythema at the base of the sternal incision has resolved completely and the wound appears to be healing with no evidence of complication.  Recommended MiraLAX for his occasional constipation. He may gradually increase activity while continuing to observe sternal precautions for another 4 weeks.  After that he may advance activity without restrictions. He has scheduled follow-up with cardiology in 2 weeks.  No further follow-up is with our service is planned but I explained to him that we would be happy to see him anytime if we can be of any assistance in his care.   Antony Odea, PA-C [facility] [phone]

## 2022-01-06 NOTE — Patient Instructions (Signed)
No change in medications from CT surgery standpoint.  Cardiology will avoid any of your Farxiga dosing at your next visit.  You may use MiraLAX as needed for occasional constipation.  Continue to observe sternal precautions with no lifting, pushing, or pulling more than 15 pounds for another 4 weeks.  After that, you may advance activity without restriction.  Follow-up as needed

## 2022-01-07 ENCOUNTER — Other Ambulatory Visit: Payer: Self-pay

## 2022-01-07 DIAGNOSIS — E1169 Type 2 diabetes mellitus with other specified complication: Secondary | ICD-10-CM

## 2022-01-07 MED ORDER — DAPAGLIFLOZIN PROPANEDIOL 10 MG PO TABS: 10.0000 mg | ORAL_TABLET | Freq: Every day | ORAL | 2 refills | Status: DC

## 2022-01-11 ENCOUNTER — Ambulatory Visit: Payer: Self-pay | Admitting: Cardiothoracic Surgery

## 2022-01-17 ENCOUNTER — Ambulatory Visit: Payer: Medicare Other | Admitting: Cardiothoracic Surgery

## 2022-01-17 ENCOUNTER — Other Ambulatory Visit (INDEPENDENT_AMBULATORY_CARE_PROVIDER_SITE_OTHER): Payer: Medicare Other

## 2022-01-17 DIAGNOSIS — E1169 Type 2 diabetes mellitus with other specified complication: Secondary | ICD-10-CM

## 2022-01-17 DIAGNOSIS — E669 Obesity, unspecified: Secondary | ICD-10-CM | POA: Diagnosis not present

## 2022-01-17 LAB — BASIC METABOLIC PANEL
BUN: 15 mg/dL (ref 6–23)
CO2: 27 mEq/L (ref 19–32)
Calcium: 9.7 mg/dL (ref 8.4–10.5)
Chloride: 108 mEq/L (ref 96–112)
Creatinine, Ser: 1.12 mg/dL (ref 0.40–1.50)
GFR: 66.45 mL/min (ref >60.00)
Glucose, Bld: 146 mg/dL — ABNORMAL HIGH (ref 70–99)
Potassium: 4.5 mEq/L (ref 3.5–5.1)
Sodium: 143 mEq/L (ref 135–145)

## 2022-01-17 LAB — HEMOGLOBIN A1C: Hgb A1c MFr Bld: 6.2 % (ref 4.6–6.5)

## 2022-01-18 ENCOUNTER — Ambulatory Visit: Payer: Medicare Other | Admitting: Physician Assistant

## 2022-01-18 ENCOUNTER — Ambulatory Visit: Payer: Medicare Other | Attending: Physician Assistant | Admitting: Physician Assistant

## 2022-01-18 ENCOUNTER — Encounter: Payer: Self-pay | Admitting: Physician Assistant

## 2022-01-18 VITALS — BP 112/72 | HR 80 | Ht 74.0 in | Wt 209.4 lb

## 2022-01-18 DIAGNOSIS — I2510 Atherosclerotic heart disease of native coronary artery without angina pectoris: Secondary | ICD-10-CM | POA: Diagnosis not present

## 2022-01-18 DIAGNOSIS — I10 Essential (primary) hypertension: Secondary | ICD-10-CM | POA: Insufficient documentation

## 2022-01-18 DIAGNOSIS — E785 Hyperlipidemia, unspecified: Secondary | ICD-10-CM | POA: Diagnosis not present

## 2022-01-18 NOTE — Assessment & Plan Note (Signed)
LDL optimal on most recent lab work.  Continue current Rx with Rosuvastatin 40 mg once daily.

## 2022-01-18 NOTE — Progress Notes (Signed)
Cardiology Office Note:    Date:  01/18/2022   ID:  Andrew [name], DOB 1950/08/25, MRN [id number]  PCP:  Darreld Mclean, MD  [facility] Cardiologist:  Dorris Carnes, MD     Referring MD: Darreld Mclean, MD   Chief Complaint:  F/u after CABG    Patient Profile: Coronary artery disease s/p CABG in 11/2021 CAC score 08/14/2021: 687 (92nd percentile)   Cardiac PET 11/03/2021: Positive ischemia; high risk, EF 44 LHC 11/10/2021: LAD proximal 80, mid 90/80/80; RI 50; LCx proximal 60, distal 99; RCA proximal 90/90, mid 90, distal 100 TTE 11/18/2021: EF 60-65, no RWMA, GR 1 DD, normal RVSF, normal PASP, trivial MR, RAP 3 Hypertension  Hyperlipidemia Diabetes mellitus  Hx of TIA, CVA GERD Seizure d/o   History of Present Illness:   Andrew [name] is a 72 y.o. male with the above problem list.  He was admitted 11/29-12/5 and underwent CABG with Dr. Prescott Gum (L-LAD, S-RI). Post op course was unremarkable. He was tx for wound cellulitis after DC by [facility]. He was also seen in the ED in early Dec (after DC) for hypotension. He returns for Cardiology f/u. He is here with his wife. He did have HHPT but has been DC'd from that. He has not been contacted yet by [facility]. He has some chest soreness still. He has not been short of breath. He has not had orthopnea, syncope. 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     EKG:  NSR, HR 80, normal axis, non-specific ST-TW changes, QTc 438    Reviewed and updated this encounter:   Tobacco  Allergies  Meds  Problems  Med Hx  Surg Hx  Fam Hx     Review of Systems  Gastrointestinal:  Negative for hematochezia and melena.    Labs/Other Test Reviewed:   Recent Labs: 03/05/2021: TSH 1.05 12/09/2021: Magnesium 2.6 12/21/2021: ALT 23; Hemoglobin 10.1; Platelets 407 01/17/2022: BUN 15; Creatinine, Ser 1.12; Potassium 4.5; Sodium 143  Recent Lipid Panel Recent Labs    02/04/21 1151  CHOL 172  TRIG 94.0  HDL  50.10  VLDL 18.8  LDLCALC 103*    Risk Assessment/Calculations/Metrics:             Physical Exam:   VS:  BP 112/72   Pulse 80   Ht '6\' 2"'$  (1.88 m)   Wt 209 lb 6.4 oz (95 kg)   SpO2 96%   BMI 26.89 kg/m    Wt Readings from Last 3 Encounters:  01/18/22 209 lb 6.4 oz (95 kg)  01/06/22 207 lb (93.9 kg)  12/21/21 207 lb (93.9 kg)    Constitutional:      Appearance: Healthy appearance. Not in distress.  Neck:     Vascular: No JVR. JVD normal.  Pulmonary:     Effort: Pulmonary effort is normal.     Breath sounds: No wheezing. No rales.  Cardiovascular:     Normal rate. Regular rhythm. Normal S1. Normal S2.      Murmurs: There is no murmur.  Edema:    Peripheral edema present.    Pretibial: trace edema of the right pretibial area.    Ankle: trace edema of the right ankle. Abdominal:     Palpations: Abdomen is soft.         ASSESSMENT & PLAN:   Coronary artery disease involving native coronary artery of native heart without angina pectoris He is doing well since his CABG  in later Nov. His appetite is better. He has been discharged from [place]. He is interested in [facility]. I have encouraged him to do this. He has been on ASA and Plavix due to prior hx of CVA/TIA. EKG is stable/improved. Continue Carvedilol 6.25 mg twice daily, Rosuvastatin 40 mg once daily. F/u in 6 mos or sooner prn.  Essential hypertension BP is controlled on current regimen which includes Amlodipine 10 mg, Carvedilol 6.25 mg twice daily, clonidine patch 0.'3mg'$ /24 Hr per week, Olmesartan 40 mg. Continue current management.   Hyperlipidemia LDL goal <70 LDL optimal on most recent lab work.  Continue current Rx with Rosuvastatin 40 mg once daily.      Cardiac Rehabilitation Eligibility Assessment  The patient is ready to start cardiac rehabilitation from a cardiac standpoint.         Dispo:  Return in about 6 months (around 07/19/2022) for Routine Follow Up, w/ Dr. Harrington Challenger.  Medication Adjustments/Labs and Tests  Ordered: Current medicines are reviewed at length with the patient today.  Concerns regarding medicines are outlined above.  Tests Ordered: Orders Placed This Encounter  Procedures   EKG 12-Lead   Medication Changes: No orders of the defined types were placed in this encounter.  Signed, Richardson Dopp, PA-C  01/18/2022 4:10 PM    [address], [place], [place]  [address] Phone: [phone]; Fax: [phone]

## 2022-01-18 NOTE — Assessment & Plan Note (Signed)
He is doing well since his CABG in later Nov. His appetite is better. He has been discharged from [place]. He is interested in [facility]. I have encouraged him to do this. He has been on ASA and Plavix due to prior hx of CVA/TIA. EKG is stable/improved. Continue Carvedilol 6.25 mg twice daily, Rosuvastatin 40 mg once daily. F/u in 6 mos or sooner prn.

## 2022-01-18 NOTE — Assessment & Plan Note (Signed)
BP is controlled on current regimen which includes Amlodipine 10 mg, Carvedilol 6.25 mg twice daily, clonidine patch 0.'3mg'$ /24 Hr per week, Olmesartan 40 mg. Continue current management.

## 2022-01-18 NOTE — Patient Instructions (Signed)
Medication Instructions:  Your physician recommends that you continue on your current medications as directed. Please refer to the Current Medication list given to you today.  *If you need a refill on your cardiac medications before your next appointment, please call your pharmacy*   Lab Work: None ordered  If you have labs (blood work) drawn today and your tests are completely normal, you will receive your results only by: [place] (if you have MyChart) OR A paper copy in the mail If you have any lab test that is abnormal or we need to change your treatment, we will call you to review the results.   Testing/Procedures: None ordered   Follow-Up: At [facility], you and your health needs are our priority.  As part of our continuing mission to provide you with exceptional heart care, we have created designated Provider Care Teams.  These Care Teams include your primary Cardiologist (physician) and Advanced Practice Providers (APPs -  Physician Assistants and Nurse Practitioners) who all work together to provide you with the care you need, when you need it.  We recommend signing up for the patient portal called "MyChart".  Sign up information is provided on this After Visit Summary.  MyChart is used to connect with patients for Virtual Visits (Telemedicine).  Patients are able to view lab/test results, encounter notes, upcoming appointments, etc.  Non-urgent messages can be sent to your provider as well.   To learn more about what you can do with MyChart, go to [email or web address].    Your next appointment:   6 month(s)  The format for your next appointment:   In Person  Provider:   Dorris Carnes, MD     Other Instructions   Important Information About Sugar

## 2022-01-20 ENCOUNTER — Encounter: Payer: Self-pay | Admitting: Endocrinology

## 2022-01-20 ENCOUNTER — Ambulatory Visit (INDEPENDENT_AMBULATORY_CARE_PROVIDER_SITE_OTHER): Payer: Medicare Other | Admitting: Endocrinology

## 2022-01-20 VITALS — BP 138/70 | HR 85 | Ht 74.0 in | Wt 209.2 lb

## 2022-01-20 DIAGNOSIS — E1169 Type 2 diabetes mellitus with other specified complication: Secondary | ICD-10-CM | POA: Diagnosis not present

## 2022-01-20 DIAGNOSIS — E669 Obesity, unspecified: Secondary | ICD-10-CM | POA: Diagnosis not present

## 2022-01-20 DIAGNOSIS — I2510 Atherosclerotic heart disease of native coronary artery without angina pectoris: Secondary | ICD-10-CM | POA: Diagnosis not present

## 2022-01-20 DIAGNOSIS — E785 Hyperlipidemia, unspecified: Secondary | ICD-10-CM | POA: Diagnosis not present

## 2022-01-20 MED ORDER — RYBELSUS 7 MG PO TABS: 1.0000 | ORAL_TABLET | Freq: Every day | ORAL | 3 refills | Status: DC

## 2022-01-20 NOTE — Patient Instructions (Addendum)
Check blood sugars on waking up 2 days a week  Also check blood sugars about 2 hours after meals and do this after different meals by rotation  Recommended blood sugar levels on waking up are 90-130 and about 2 hours after meal is 130-160  Please bring your blood sugar monitor to each visit, thank you  Rybelsus improves blood sugar control as well as can help with weight loss and reduces cardiovascular events. Need to take the capsules on empty stomach 30 minutes before breakfast with 4 ounces of water daily.  You may feel more fullness at mealtimes and try to keep portions at meals smaller Some people may have nausea or even vomiting that may occur in the first few days; usually the symptoms go away with time. Please call if nausea does not improve within 1-2 weeks  After sample fill Rx for '7mg'$    Stop [place]
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
## 2022-01-21 ENCOUNTER — Other Ambulatory Visit: Payer: Self-pay | Admitting: Surgical

## 2022-01-21 ENCOUNTER — Other Ambulatory Visit: Payer: Self-pay

## 2022-01-21 MED ORDER — CARVEDILOL 6.25 MG PO TABS: 6.2500 mg | ORAL_TABLET | Freq: Two times a day (BID) | ORAL | 3 refills | Status: DC

## 2022-01-21 NOTE — Telephone Encounter (Signed)
Pt's medication was sent to pt's pharmacy as requested. Confirmation received.

## 2022-01-27 ENCOUNTER — Ambulatory Visit: Payer: Medicare Other | Admitting: Adult Health
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
## 2022-02-04 NOTE — Patient Instructions (Incomplete)
It was great to see you again today, I am so glad you are doing well  Recommend the latest COVID booster and potentially shingles series, and dose of RSV at your pharmacy  I will be in touch with Richardson Dopp, the PA you saw to see how we get you into the cardiac rehab  Please see me in about 4 months

## 2022-02-07 ENCOUNTER — Encounter: Payer: Self-pay | Admitting: Family Medicine

## 2022-02-07 ENCOUNTER — Ambulatory Visit (INDEPENDENT_AMBULATORY_CARE_PROVIDER_SITE_OTHER): Payer: Medicare Other | Admitting: Family Medicine

## 2022-02-07 VITALS — BP 136/76 | HR 88 | Resp 18 | Ht 74.0 in | Wt 203.0 lb

## 2022-02-07 DIAGNOSIS — E1151 Type 2 diabetes mellitus with diabetic peripheral angiopathy without gangrene: Secondary | ICD-10-CM

## 2022-02-07 DIAGNOSIS — I2510 Atherosclerotic heart disease of native coronary artery without angina pectoris: Secondary | ICD-10-CM

## 2022-02-07 DIAGNOSIS — I10 Essential (primary) hypertension: Secondary | ICD-10-CM

## 2022-02-07 MED ORDER — RYBELSUS 7 MG PO TABS: 1.0000 | ORAL_TABLET | Freq: Every day | ORAL | 3 refills | Status: DC

## 2022-02-07 MED ORDER — OLMESARTAN MEDOXOMIL 40 MG PO TABS: ORAL_TABLET | ORAL | 3 refills | Status: DC

## 2022-02-14 ENCOUNTER — Telehealth (HOSPITAL_COMMUNITY): Payer: Self-pay

## 2022-02-14 NOTE — Telephone Encounter (Signed)
Pt daughter Luberta Mutter called stated pt is interested in CR. Patient will come in for orientation on 02/15/22 @ 10:30AM and will attend the 10:15AM exercise class. Went over insurance, Charmaine verbalized understanding.

## 2022-02-14 NOTE — Telephone Encounter (Signed)
Pt insurance is active and benefits verified through Medicare A/B. Co-pay $0.00, DED $240.00/$240.00 met, out of pocket $0.00/$0.00 met, co-insurance 20%. No pre-authorization required. Passport, 2/5/4:03PM, JGZ#[id number]   How many CR sessions are covered? (36 sessions for TCR, 72 sessions for ICR)72 Is this a lifetime maximum or an annual maximum? Lifetime Has the member used any of these services to date? No Is there a time limit (weeks/months) on start of program and/or program completion? No   2ndary insurance is active and benefits verified through [employer]. Co-pay $35.00, DED $0.00/$0.00 met, out of pocket $6,500.00/$76.91 met, co-insurance $0.00. No pre-authorization required. Passport, 02/14/2022 @ 4:06PM, KNZ#[id number]

## 2022-02-15 ENCOUNTER — Encounter (HOSPITAL_COMMUNITY): Payer: Self-pay

## 2022-02-15 ENCOUNTER — Encounter (HOSPITAL_COMMUNITY)
Admission: RE | Admit: 2022-02-15 | Discharge: 2022-02-15 | Disposition: A | Discharge status: Home / Self Care | Payer: Medicare Other | Source: Ambulatory Visit | Attending: Internal Medicine | Admitting: Internal Medicine

## 2022-02-15 VITALS — BP 122/70 | HR 80 | Ht 72.25 in | Wt 200.4 lb

## 2022-02-15 DIAGNOSIS — Z48812 Encounter for surgical aftercare following surgery on the circulatory system: Secondary | ICD-10-CM | POA: Insufficient documentation

## 2022-02-15 DIAGNOSIS — Z951 Presence of aortocoronary bypass graft: Secondary | ICD-10-CM | POA: Insufficient documentation

## 2022-02-15 HISTORY — DX: Atherosclerotic heart disease of native coronary artery without angina pectoris: I25.10

## 2022-02-15 LAB — GLUCOSE, CAPILLARY: Glucose-Capillary: 114 mg/dL — ABNORMAL HIGH (ref 70–99)

## 2022-02-15 NOTE — Progress Notes (Signed)
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 benefits include lowered blood pressure, healthier heart rate, decreased cholesterol and triglyceride levels, improved immune function, and reduced stress. Seek guidance from your physician and exercise physiologist before implementing an exercise routine and learn your capabilities and proper form for all exercise.  Medical   Aging: Enhancing Your Quality of Life  Clinical staff conducted group or individual video education with verbal and written material and guidebook.  Patient learns key strategies and recommendations to stay in good physical health and enhance quality of life, such as prevention strategies, having an advocate, securing a [place], and keeping a list of medications and system for tracking them. It also discusses how to  avoid risk for bone loss.  Biology of Weight Control  Clinical staff conducted group or individual video education with verbal and written material and guidebook.  Patient learns that weight gain occurs because we consume more calories than we burn (eating more, moving less). Even if your body weight is normal, you may have higher ratios of fat compared to muscle mass. Too much body fat puts you at increased risk for cardiovascular disease, heart attack, stroke, type 2 diabetes, and obesity-related cancers. In addition to exercise, following the [place] can help reduce your risk.  Decoding Lab Results  Clinical staff conducted group or individual video education with verbal and written material and guidebook.  Patient learns that lab test reflects one measurement whose values change over time and are influenced by many factors, including medication, stress, sleep, exercise, food, hydration, pre-existing medical conditions, and more. It is recommended to use the knowledge from this video to become more involved with your lab results and evaluate your numbers to speak with your doctor.   Diseases of Our Time - Overview  Clinical staff conducted group or individual video education with verbal and written material and guidebook.  Patient learns that according to the CDC, 50% to 70% of chronic diseases (such as obesity, type 2 diabetes, elevated lipids, hypertension, and heart disease) are avoidable through lifestyle improvements including healthier food choices, listening to satiety cues, and increased physical activity.  Sleep Disorders Clinical staff conducted group or individual video education with verbal and written material and guidebook.  Patient learns how good quality and duration of sleep are important to overall health and well-being. Patient also learns about sleep disorders and how they impact health along with recommendations to address them, including discussing with a  physician.  Nutrition  Dining Out - Part 2 Clinical staff  conducted group or individual video education with verbal and written material and guidebook.  Patient learns how to plan ahead and communicate in order to maximize their dining experience in a healthy and nutritious manner. Included are recommended food choices based on the type of restaurant the patient is visiting.   Fueling a Best boy conducted group or individual video education with verbal and written material and guidebook.  There is a strong connection between our food choices and our health. Diseases like obesity and type 2 diabetes are very prevalent and are in large-part due to lifestyle choices. The Pritikin Eating Plan provides plenty of food and hunger-curbing satisfaction. It is easy to follow, affordable, and helps reduce health risks.  Menu Workshop  Clinical staff conducted group or individual video education with verbal and written material and guidebook.  Patient learns that restaurant meals can sabotage health goals because they are often packed with calories, fat, sodium, and sugar. Recommendations include strategies to plan ahead and to communicate with the manager, chef, or server to help order a healthier meal.  Planning Your Eating Strategy  Clinical staff conducted group or individual video education with verbal and written material and guidebook.  Patient learns about the [employer] and its benefit of reducing the risk of disease. The [place] does not focus on calories. Instead, it emphasizes high-quality, nutrient-rich foods. By knowing the characteristics of the foods, we choose, we can determine their calorie density and make informed decisions.  Targeting Your Nutrition Priorities  Clinical staff conducted group or individual video education with verbal and written material and guidebook.  Patient learns that lifestyle habits have a tremendous impact on disease  risk and progression. This video provides eating and physical activity recommendations based on your personal health goals, such as reducing LDL cholesterol, losing weight, preventing or controlling type 2 diabetes, and reducing high blood pressure.  Vitamins and Minerals  Clinical staff conducted group or individual video education with verbal and written material and guidebook.  Patient learns different ways to obtain key vitamins and minerals, including through a recommended healthy diet. It is important to discuss all supplements you take with your doctor.   Healthy Mind-Set    Smoking Cessation  Clinical staff conducted group or individual video education with verbal and written material and guidebook.  Patient learns that cigarette smoking and tobacco addiction pose a serious health risk which affects millions of people. Stopping smoking will significantly reduce the risk of heart disease, lung disease, and many forms of cancer. Recommended strategies for quitting are covered, including working with your doctor to develop a successful plan.  Culinary   Becoming a Financial trader conducted group or individual video education with verbal and written material and guidebook.  Patient learns that cooking at home can be healthy, cost-effective, quick, and puts them in control. Keys to cooking healthy recipes will include looking at your recipe, assessing your equipment needs, planning ahead, making it simple, choosing cost-effective seasonal ingredients, and limiting the use of added fats, salts, and sugars.  Cooking - Breakfast and Snacks  Clinical staff conducted group or individual video education with verbal and written material and guidebook.  Patient learns how important breakfast is to satiety and nutrition through the entire day. Recommendations include key foods to eat during breakfast to help stabilize blood sugar levels and to prevent overeating at meals later in the day.  Planning ahead is also a key component.  Cooking -  Dinner and [employer] staff conducted group or individual video education with verbal and written material and guidebook.  Patient learns eating strategies to improve overall health, including an approach to cook more at home. Recommendations include thinking of animal protein as a side on your plate rather than center stage and focusing instead on lower calorie dense options like vegetables, fruits, whole grains, and plant-based proteins, such as beans. Making sauces in large quantities to freeze for later and leaving the skin on your vegetables are also recommended to maximize your experience.  Cooking - Healthy Salads and Dressing Clinical staff conducted group or individual video education with verbal and written material and guidebook.  Patient learns that vegetables, fruits, whole grains, and legumes are the foundations of the [place]. Recommendations include how to incorporate each of these in flavorful and healthy salads, and how to create homemade salad dressings. Proper handling of ingredients is also covered. Cooking - Soups and [employer] - Soups and Desserts Clinical staff conducted group or individual video education with verbal and written material and guidebook.  Patient learns that Pritikin soups and desserts make for easy, nutritious, and delicious snacks and meal components that are low in sodium, fat, sugar, and calorie density, while high in vitamins, minerals, and filling fiber. Recommendations include simple and healthy ideas for soups and desserts.   Overview     The Pritikin Solution Program Overview Clinical staff conducted group or individual video education with verbal and written material and guidebook.  Patient learns that the results of the [place] Program have been documented in more than 100 articles published in peer-reviewed journals, and the benefits include reducing risk factors for  (and, in some cases, even reversing) high cholesterol, high blood pressure, type 2 diabetes, obesity, and more! An overview of the three key pillars of the Pritikin Program will be covered: eating well, doing regular exercise, and having a healthy mind-set.  WORKSHOPS  Exercise: Exercise Basics: Building Your Action Plan Clinical staff led group instruction and group discussion with PowerPoint presentation and patient guidebook. To enhance the learning environment the use of posters, models and videos may be added. At the conclusion of this workshop, patients will comprehend the difference between physical activity and exercise, as well as the benefits of incorporating both, into their routine. Patients will understand the FITT (Frequency, Intensity, Time, and Type) principle and how to use it to build an exercise action plan. In addition, safety concerns and other considerations for exercise and cardiac rehab will be addressed by the presenter. The purpose of this lesson is to promote a comprehensive and effective weekly exercise routine in order to improve patients' overall level of fitness.   Managing Heart Disease: Your Path to a Healthier Heart Clinical staff led group instruction and group discussion with PowerPoint presentation and patient guidebook. To enhance the learning environment the use of posters, models and videos may be added.At the conclusion of this workshop, patients will understand the anatomy and physiology of the heart. Additionally, they will understand how Pritikin's three pillars impact the risk factors, the progression, and the management of heart disease.  The purpose of this lesson is to provide a high-level overview of the heart, heart disease, and how the Pritikin lifestyle positively impacts risk factors.  Exercise Biomechanics Clinical staff led group instruction and group discussion with PowerPoint presentation and patient guidebook. To enhance the learning  environment the use of posters, models and videos may be added. Patients will learn how  the structural parts of their bodies function and how these functions impact their daily activities, movement, and exercise. Patients will learn how to promote a neutral spine, learn how to manage pain, and identify ways to improve their physical movement in order to promote healthy living. The purpose of this lesson is to expose patients to common physical limitations that impact physical activity. Participants will learn practical ways to adapt and manage aches and pains, and to minimize their effect on regular exercise. Patients will learn how to maintain good posture while sitting, walking, and lifting.  Balance Training and Fall Prevention  Clinical staff led group instruction and group discussion with PowerPoint presentation and patient guidebook. To enhance the learning environment the use of posters, models and videos may be added. At the conclusion of this workshop, patients will understand the importance of their sensorimotor skills (vision, proprioception, and the vestibular system) in maintaining their ability to balance as they age. Patients will apply a variety of balancing exercises that are appropriate for their current level of function. Patients will understand the common causes for poor balance, possible solutions to these problems, and ways to modify their physical environment in order to minimize their fall risk. The purpose of this lesson is to teach patients about the importance of maintaining balance as they age and ways to minimize their risk of falling.  WORKSHOPS   Nutrition:  Fueling a Scientist, research (physical sciences) led group instruction and group discussion with PowerPoint presentation and patient guidebook. To enhance the learning environment the use of posters, models and videos may be added. Patients will review the foundational principles of the [place] and understand  what constitutes a serving size in each of the food groups. Patients will also learn Pritikin-friendly foods that are better choices when away from home and review make-ahead meal and snack options. Calorie density will be reviewed and applied to three nutrition priorities: weight maintenance, weight loss, and weight gain. The purpose of this lesson is to reinforce (in a group setting) the key concepts around what patients are recommended to eat and how to apply these guidelines when away from home by planning and selecting Pritikin-friendly options. Patients will understand how calorie density may be adjusted for different weight management goals.  Mindful Eating  Clinical staff led group instruction and group discussion with PowerPoint presentation and patient guidebook. To enhance the learning environment the use of posters, models and videos may be added. Patients will briefly review the concepts of the [place] and the importance of low-calorie dense foods. The concept of mindful eating will be introduced as well as the importance of paying attention to internal hunger signals. Triggers for non-hunger eating and techniques for dealing with triggers will be explored. The purpose of this lesson is to provide patients with the opportunity to review the basic principles of the [place], discuss the value of eating mindfully and how to measure internal cues of hunger and fullness using the Hunger Scale. Patients will also discuss reasons for non-hunger eating and learn strategies to use for controlling emotional eating.  Targeting Your Nutrition Priorities Clinical staff led group instruction and group discussion with PowerPoint presentation and patient guidebook. To enhance the learning environment the use of posters, models and videos may be added. Patients will learn how to determine their genetic susceptibility to disease by reviewing their family history. Patients will gain  insight into the importance of diet as part of an overall healthy lifestyle in mitigating  the impact of genetics and other environmental insults. The purpose of this lesson is to provide patients with the opportunity to assess their personal nutrition priorities by looking at their family history, their own health history and current risk factors. Patients will also be able to discuss ways of prioritizing and modifying the [place] for their highest risk areas  Menu  Clinical staff led group instruction and group discussion with PowerPoint presentation and patient guidebook. To enhance the learning environment the use of posters, models and videos may be added. Using menus brought in from [employer], or printed from [employer], patients will apply the [place] dining out guidelines that were presented in the [employer] video. Patients will also be able to practice these guidelines in a variety of provided scenarios. The purpose of this lesson is to provide patients with the opportunity to practice hands-on learning of the [place] with actual menus and practice scenarios.  Label Reading Clinical staff led group instruction and group discussion with PowerPoint presentation and patient guidebook. To enhance the learning environment the use of posters, models and videos may be added. Patients will review and discuss the Pritikin label reading guidelines presented in Pritikin's Label Reading Educational series video. Using fool labels brought in from local grocery stores and markets, patients will apply the label reading guidelines and determine if the packaged food meet the Pritikin guidelines. The purpose of this lesson is to provide patients with the opportunity to review, discuss, and practice hands-on learning of the Pritikin Label Reading guidelines with actual packaged food labels. [employer] Workshops are  designed to teach patients ways to prepare quick, simple, and affordable recipes at home. The importance of nutrition's role in chronic disease risk reduction is reflected in its emphasis in the overall Pritikin program. By learning how to prepare essential core Pritikin Eating Plan recipes, patients will increase control over what they eat; be able to customize the flavor of foods without the use of added salt, sugar, or fat; and improve the quality of the food they consume. By learning a set of core recipes which are easily assembled, quickly prepared, and affordable, patients are more likely to prepare more healthy foods at home. These workshops focus on convenient breakfasts, simple entres, side dishes, and desserts which can be prepared with minimal effort and are consistent with nutrition recommendations for cardiovascular risk reduction. Cooking [employer] are taught by a Engineer, materials (RD) who has been trained by the [employer]. The chef or RD has a clear understanding of the importance of minimizing - if not completely eliminating - added fat, sugar, and sodium in recipes. Throughout the series of [place] Workshop sessions, patients will learn about healthy ingredients and efficient methods of cooking to build confidence in their capability to prepare    Cooking School weekly topics:  Adding Flavor- Sodium-Free  Fast and Healthy Breakfasts  Powerhouse Plant-Based Proteins  Satisfying Salads and Dressings  Simple Sides and Sauces  International Cuisine-Spotlight on the [place] Zones  Delicious Desserts  Savory Soups  Efficiency Cooking - Meals in a Snap  Tasty Appetizers and Snacks  Comforting Weekend Breakfasts  One-Pot Wonders   Fast Evening Meals  Easy [place] (Psychosocial): New Thoughts, New Behaviors Clinical staff led group instruction and group discussion with  PowerPoint presentation and patient guidebook. To enhance the learning environment the  use of posters, models and videos may be added. Patients will learn and practice techniques for developing effective health and lifestyle goals. Patients will be able to effectively apply the goal setting process learned to develop at least one new personal goal.  The purpose of this lesson is to expose patients to a new skill set of behavior modification techniques such as techniques setting SMART goals, overcoming barriers, and achieving new thoughts and new behaviors.  Managing Moods and Relationships Clinical staff led group instruction and group discussion with PowerPoint presentation and patient guidebook. To enhance the learning environment the use of posters, models and videos may be added. Patients will learn how emotional and chronic stress factors can impact their health and relationships. They will learn healthy ways to manage their moods and utilize positive coping mechanisms. In addition, ICR patients will learn ways to improve communication skills. The purpose of this lesson is to expose patients to ways of understanding how one's mood and health are intimately connected. Developing a healthy outlook can help build positive relationships and connections with others. Patients will understand the importance of utilizing effective communication skills that include actively listening and being heard. They will learn and understand the importance of the "4 Cs" and especially Connections in fostering of a Healthy Mind-Set.  Healthy Sleep for a Healthy Heart Clinical staff led group instruction and group discussion with PowerPoint presentation and patient guidebook. To enhance the learning environment the use of posters, models and videos may be added. At the conclusion of this workshop, patients will be able to demonstrate knowledge of the importance of sleep to overall health, well-being, and quality of life. They  will understand the symptoms of, and treatments for, common sleep disorders. Patients will also be able to identify daytime and nighttime behaviors which impact sleep, and they will be able to apply these tools to help manage sleep-related challenges. The purpose of this lesson is to provide patients with a general overview of sleep and outline the importance of quality sleep. Patients will learn about a few of the most common sleep disorders. Patients will also be introduced to the concept of "sleep hygiene," and discover ways to self-manage certain sleeping problems through simple daily behavior changes. Finally, the workshop will motivate patients by clarifying the links between quality sleep and their goals of heart-healthy living.   Recognizing and Reducing Stress Clinical staff led group instruction and group discussion with PowerPoint presentation and patient guidebook. To enhance the learning environment the use of posters, models and videos may be added. At the conclusion of this workshop, patients will be able to understand the types of stress reactions, differentiate between acute and chronic stress, and recognize the impact that chronic stress has on their health. They will also be able to apply different coping mechanisms, such as reframing negative self-talk. Patients will have the opportunity to practice a variety of stress management techniques, such as deep abdominal breathing, progressive muscle relaxation, and/or guided imagery.  The purpose of this lesson is to educate patients on the role of stress in their lives and to provide healthy techniques for coping with it.  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 Doe not have a prosthesis   Learning Preferences Audio;Individual Instruction;Group Instruction;Video;Verbal  Instruction;Skilled Demonstration;Pictoral;Written Material  Education Topics:  Knowledge Questionnaire Score:  Knowledge Questionnaire Score - 02/15/22 1420       Knowledge Questionnaire Score   Pre Score 18/24             Core Components/Risk Factors/Patient Goals at Admission:  Personal Goals and Risk Factors at Admission - 02/15/22 1357       Core Components/Risk Factors/Patient Goals on Admission    Weight Management Yes;Weight Maintenance    Intervention Weight Management: Develop a combined nutrition and exercise program designed to reach desired caloric intake, while maintaining appropriate intake of nutrient and fiber, sodium and fats, and appropriate energy expenditure required for the weight goal.;Weight Management: Provide education and appropriate resources to help participant work on and attain dietary goals.    Expected Outcomes Short Term: Continue to assess and modify interventions until short term weight is achieved;Long Term: Adherence to nutrition and physical activity/exercise program aimed toward attainment of established weight goal;Understanding recommendations for meals to include 15-35% energy as protein, 25-35% energy from fat, 35-60% energy from carbohydrates, less than '200mg'$  of dietary cholesterol, 20-35 gm of total fiber daily    Diabetes Yes    Intervention Provide education about signs/symptoms and action to take for hypo/hyperglycemia.;Provide education about proper nutrition, including hydration, and aerobic/resistive exercise prescription along with prescribed medications to achieve blood glucose in normal ranges: Fasting glucose 65-99 mg/dL    Expected Outcomes Short Term: Participant verbalizes understanding of the signs/symptoms and immediate care of hyper/hypoglycemia, proper foot care and importance of medication, aerobic/resistive exercise and nutrition plan for blood glucose control.;Long Term: Attainment of HbA1C < 7%.     Hypertension Yes    Intervention Provide education on lifestyle modifcations including regular physical activity/exercise, weight management, moderate sodium restriction and increased consumption of fresh fruit, vegetables, and low fat dairy, alcohol moderation, and smoking cessation.;Monitor prescription use compliance.    Expected Outcomes Short Term: Continued assessment and intervention until BP is < 140/3m HG in hypertensive participants. < 130/844mHG in hypertensive participants with diabetes, heart failure or chronic kidney disease.;Long Term: Maintenance of blood pressure at goal levels.    Lipids Yes    Intervention Provide education and support for participant on nutrition & aerobic/resistive exercise along with prescribed medications to achieve LDL '70mg'$ , HDL >'40mg'$ .    Expected Outcomes Short Term: Participant states understanding of desired cholesterol values and is compliant with medications prescribed. Participant is following exercise prescription and nutrition guidelines.;Long Term: Cholesterol controlled with medications as prescribed, with individualized exercise RX and with personalized nutrition plan. Value goals: LDL < '70mg'$ , HDL > 40 mg.    Stress Yes    Intervention Offer individual and/or small group education and counseling on adjustment to heart disease, stress management and health-related lifestyle change. Teach and support self-help strategies.;Refer participants experiencing significant psychosocial distress to appropriate mental health specialists for further evaluation and treatment. When possible, include family members and significant others in education/counseling sessions.    Expected Outcomes Short Term: Participant demonstrates changes in health-related behavior, relaxation and other stress management skills, ability to obtain effective social support, and compliance with psychotropic medications if prescribed.;Long Term: Emotional wellbeing is indicated by absence of  clinically significant psychosocial distress or social isolation.    Personal Goal Other Yes    Personal Goal Increase mobility to be able to return to golfing.    Intervention Provide aerobic and stretching exercise routine to help improve mobility and flexibilty to be able to return to golf once cleared by cardiology.  Expected Outcomes Patient will have to mobility to be able to resume golfing once cleared by cardiologist to do so.             Core Components/Risk Factors/Patient Goals Review:    Core Components/Risk Factors/Patient Goals at Discharge (Final Review):    ITP Comments:  ITP Comments     Row Name 02/15/22 1014           ITP Comments Medical Director- Dr. Fransico Him, MD, Introduction to [facility] Program/ Intensive Cardiac Rehab. Initial Orientation Packet reviewed with the Patient                Comments: Participant attended orientation for the cardiac rehabilitation program on  02/15/2022  to perform initial intake and exercise walk test. Patient introduced to the [place] education and orientation packet was reviewed. Completed 6-minute walk test, measurements, initial ITP, and exercise prescription. Vital signs stable. Telemetry-normal sinus rhythm with inverted T-wave, asymptomatic.   Service time was from 1014 to 1236.

## 2022-02-15 NOTE — Progress Notes (Signed)
Cardiac Rehab Medication Review by a Nurse  Does the patient  feel that his/her medications are working for him/her?  yes  Has the patient been experiencing any side effects to the medications prescribed?  yes  Does the patient measure his/her own blood pressure or blood glucose at home?  no   Does the patient have any problems obtaining medications due to transportation or finances?   no  Understanding of regimen: good Understanding of indications: good Potential of compliance: good    Nurse comments: Nymir is taking his medications as prescribed. Leovardo says he sometimes experiences nausea from taking Rybelsus. Avraj has a CBG meter and does not check his CBG's at home. I asked to patient to check his CBG's on cardiac rehab days. Patient has a BP cuff but does not check his blood pressures at home.    Christa See Terryl Niziolek RN 02/15/2022 1:43 PM

## 2022-02-21 ENCOUNTER — Encounter (HOSPITAL_COMMUNITY)
Admission: RE | Admit: 2022-02-21 | Discharge: 2022-02-21 | Disposition: A | Discharge status: Home / Self Care | Payer: Medicare Other | Source: Ambulatory Visit | Attending: Internal Medicine | Admitting: Internal Medicine

## 2022-02-21 DIAGNOSIS — Z951 Presence of aortocoronary bypass graft: Secondary | ICD-10-CM | POA: Diagnosis not present

## 2022-02-21 DIAGNOSIS — Z48812 Encounter for surgical aftercare following surgery on the circulatory system: Secondary | ICD-10-CM | POA: Diagnosis not present

## 2022-02-21 LAB — GLUCOSE, CAPILLARY
Glucose-Capillary: 151 mg/dL — ABNORMAL HIGH (ref 70–99)
Glucose-Capillary: 178 mg/dL — ABNORMAL HIGH (ref 70–99)

## 2022-02-21 NOTE — Progress Notes (Signed)
Daily Session Note  Patient Details  Name: Andrew [name] MRN: [id number] Date of Birth: 12/03/1950 Referring Provider:   Flowsheet Row INTENSIVE CARDIAC REHAB ORIENT from 02/15/2022 in [facility] for Heart, Vascular, & [place]  Referring Provider Fay Records, MD       Encounter Date: 02/21/2022  Check In:  Session Check In - 02/21/22 1128       Check-In   Supervising physician immediately available to respond to emergencies [facility] MD immediately available    Physician(s) Ambrose Pancoast, NP    Location [facility]    Staff Present Barnet Pall, RN, Deland Pretty, MS, ACSM-CEP, Exercise Physiologist;Jetta Gilford Rile BS, ACSM-CEP, Exercise Physiologist;David Lilyan Punt, MS, ACSM-CEP, CCRP, Exercise Physiologist;Kaylee Rosana Hoes, MS, ACSM-CEP, Exercise Physiologist    Virtual Visit No    Medication changes reported     No    Fall or balance concerns reported    No    Tobacco Cessation No Change    Warm-up and Cool-down Performed as group-led instruction    Resistance Training Performed Yes    VAD Patient? No    PAD/SET Patient? No      Pain Assessment   Currently in Pain? No/denies    Pain Score 0-No pain    Multiple Pain Sites No             Capillary Blood Glucose: Results for orders placed or performed during the hospital encounter of 02/15/22 (from the past 24 hour(s))  Glucose, capillary     Status: Abnormal   Collection Time: 02/21/22 10:31 AM  Result Value Ref Range   Glucose-Capillary 178 (H) 70 - 99 mg/dL  Glucose, capillary     Status: Abnormal   Collection Time: 02/21/22 11:24 AM  Result Value Ref Range   Glucose-Capillary 151 (H) 70 - 99 mg/dL     Exercise Prescription Changes - 02/21/22 1033       Response to Exercise   Blood Pressure (Admit) 122/78    Blood Pressure (Exercise) 138/74    Blood Pressure (Exit) 108/64    Heart Rate (Admit) 80 bpm    Heart Rate (Exercise) 105 bpm    Heart Rate (Exit) 85  bpm    Rating of Perceived Exertion (Exercise) 11    Symptoms None    Comments Off to a good start with exercise.    Duration Continue with 30 min of aerobic exercise without signs/symptoms of physical distress.    Intensity THRR unchanged      Progression   Progression Continue to progress workloads to maintain intensity without signs/symptoms of physical distress.    Average METs 1.4      Resistance Training   Training Prescription Yes    Weight 3 lbs    Reps 10-15    Time 10 Minutes      Interval Training   Interval Training No      Recumbant Bike   Level 1    Minutes 15    METs 1.4      NuStep   Level 1    SPM 85    Minutes 15    METs 1.4             Social History   Tobacco Use  Smoking Status Never  Smokeless Tobacco Never    Goals Met:  Exercise tolerated well No report of concerns or symptoms today Strength training completed today  Goals Unmet:  Not Applicable  Comments: Pt started cardiac rehab  today.  Pt tolerated light exercise without difficulty. VSS, telemetry-Sinus rhythm t wave inversion, asymptomatic.  Medication list reconciled. Pt denies barriers to medicaiton compliance.  PSYCHOSOCIAL ASSESSMENT:  PHQ-7. Pt exhibits positive coping skills, hopeful outlook with supportive family. No psychosocial needs identified at this time, no psychosocial interventions necessary.    Pt enjoys playing golf.   Pt oriented to exercise equipment and routine.    Understanding verbalized.Harrell Gave RN BSN    Dr. Fransico Him is Medical Director for Cardiac Rehab at [facility].

## 2022-02-23 ENCOUNTER — Encounter (HOSPITAL_COMMUNITY)
Admission: RE | Admit: 2022-02-23 | Discharge: 2022-02-23 | Disposition: A | Discharge status: Home / Self Care | Payer: Medicare Other | Source: Ambulatory Visit | Attending: Internal Medicine | Admitting: Internal Medicine

## 2022-02-23 DIAGNOSIS — Z48812 Encounter for surgical aftercare following surgery on the circulatory system: Secondary | ICD-10-CM | POA: Diagnosis not present

## 2022-02-23 DIAGNOSIS — Z951 Presence of aortocoronary bypass graft: Secondary | ICD-10-CM | POA: Diagnosis not present

## 2022-02-23 LAB — GLUCOSE, CAPILLARY
Glucose-Capillary: 143 mg/dL — ABNORMAL HIGH (ref 70–99)
Glucose-Capillary: 185 mg/dL — ABNORMAL HIGH (ref 70–99)

## 2022-02-25 ENCOUNTER — Encounter (HOSPITAL_COMMUNITY)
Admission: RE | Admit: 2022-02-25 | Discharge: 2022-02-25 | Disposition: A | Discharge status: Home / Self Care | Payer: Medicare Other | Source: Ambulatory Visit | Attending: Internal Medicine | Admitting: Internal Medicine

## 2022-02-25 ENCOUNTER — Telehealth: Payer: Self-pay | Admitting: Family Medicine

## 2022-02-25 DIAGNOSIS — Z48812 Encounter for surgical aftercare following surgery on the circulatory system: Secondary | ICD-10-CM | POA: Diagnosis not present

## 2022-02-25 DIAGNOSIS — Z951 Presence of aortocoronary bypass graft: Secondary | ICD-10-CM | POA: Diagnosis not present

## 2022-02-25 DIAGNOSIS — R319 Hematuria, unspecified: Secondary | ICD-10-CM

## 2022-02-25 LAB — GLUCOSE, CAPILLARY
Glucose-Capillary: 177 mg/dL — ABNORMAL HIGH (ref 70–99)
Glucose-Capillary: 123 mg/dL — ABNORMAL HIGH (ref 70–99)

## 2022-02-25 NOTE — Telephone Encounter (Signed)
Did not see message high priority for provider review

## 2022-02-25 NOTE — Telephone Encounter (Signed)
Pt has noted blood in his urine and discomfort with urination for about 2 weeks- coming and going Drinking plenty of water seems to keep it under control He otherwise feels fine- no fever, chills,no abd or back pain  He is seeing me on Thursday Asked him to please bring in a urine sample for culture however on Monday- he will do so He will seek care right away if getting worse in the meantime

## 2022-02-25 NOTE — Telephone Encounter (Signed)
FYI- Pt called to advise he is doing cardiac rehab and he has burning and some bleeding when he urinates occasionally. Pt said when he drinks 48 oz or more, it does not happen. It's only when he falls short of that number that he notices it. Pt said that someone from the other office thought it could be related to the tubing he had. Pt said when he drinks the 48 oz he urinates at least 12x/day. Pt said he was okay to wait until Thursday to see [facility] and did not need to see another provider sooner.
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
## 2022-02-28 ENCOUNTER — Encounter (HOSPITAL_COMMUNITY)
Admission: RE | Admit: 2022-02-28 | Discharge: 2022-02-28 | Disposition: A | Discharge status: Home / Self Care | Payer: Medicare Other | Source: Ambulatory Visit | Attending: Internal Medicine | Admitting: Internal Medicine

## 2022-02-28 ENCOUNTER — Other Ambulatory Visit: Payer: Self-pay | Admitting: Family Medicine

## 2022-02-28 ENCOUNTER — Other Ambulatory Visit (INDEPENDENT_AMBULATORY_CARE_PROVIDER_SITE_OTHER): Payer: Medicare Other

## 2022-02-28 DIAGNOSIS — Z951 Presence of aortocoronary bypass graft: Secondary | ICD-10-CM | POA: Diagnosis not present

## 2022-02-28 DIAGNOSIS — Z48812 Encounter for surgical aftercare following surgery on the circulatory system: Secondary | ICD-10-CM | POA: Diagnosis not present

## 2022-02-28 DIAGNOSIS — R319 Hematuria, unspecified: Secondary | ICD-10-CM

## 2022-02-28 LAB — POCT URINALYSIS DIP (MANUAL ENTRY)
Bilirubin, UA: NEGATIVE
Glucose, UA: >=1000 mg/dL — AB
Ketones, POC UA: NEGATIVE mg/dL
Nitrite, UA: NEGATIVE
Protein Ur, POC: 100 mg/dL — AB
Spec Grav, UA: 1.015 (ref 1.010–1.025)
Urobilinogen, UA: 0.2 E.U./dL
pH, UA: 5 (ref 5.0–8.0)

## 2022-03-01 LAB — URINE CULTURE
MICRO NUMBER:: 14582950
Result:: NO GROWTH
SPECIMEN QUALITY:: ADEQUATE

## 2022-03-02 ENCOUNTER — Encounter (HOSPITAL_COMMUNITY)
Admission: RE | Admit: 2022-03-02 | Discharge: 2022-03-02 | Disposition: A | Discharge status: Home / Self Care | Payer: Medicare Other | Source: Ambulatory Visit | Attending: Internal Medicine | Admitting: Internal Medicine

## 2022-03-02 DIAGNOSIS — Z48812 Encounter for surgical aftercare following surgery on the circulatory system: Secondary | ICD-10-CM | POA: Diagnosis not present

## 2022-03-02 DIAGNOSIS — Z951 Presence of aortocoronary bypass graft: Secondary | ICD-10-CM | POA: Diagnosis not present

## 2022-03-02 LAB — GLUCOSE, CAPILLARY: Glucose-Capillary: 205 mg/dL — ABNORMAL HIGH (ref 70–99)

## 2022-03-03 ENCOUNTER — Ambulatory Visit (INDEPENDENT_AMBULATORY_CARE_PROVIDER_SITE_OTHER): Payer: Medicare Other | Admitting: Family Medicine

## 2022-03-03 ENCOUNTER — Encounter: Payer: Self-pay | Admitting: Family Medicine

## 2022-03-03 VITALS — BP 116/64 | HR 76 | Resp 18 | Ht 72.0 in | Wt 200.2 lb

## 2022-03-03 DIAGNOSIS — R310 Gross hematuria: Secondary | ICD-10-CM

## 2022-03-03 DIAGNOSIS — R1030 Lower abdominal pain, unspecified: Secondary | ICD-10-CM | POA: Diagnosis not present

## 2022-03-03 DIAGNOSIS — I2510 Atherosclerotic heart disease of native coronary artery without angina pectoris: Secondary | ICD-10-CM | POA: Diagnosis not present

## 2022-03-03 DIAGNOSIS — R350 Frequency of micturition: Secondary | ICD-10-CM

## 2022-03-03 LAB — COMPREHENSIVE METABOLIC PANEL
ALT: 13 U/L (ref 0–53)
AST: 14 U/L (ref 0–37)
Albumin: 4.2 g/dL (ref 3.5–5.2)
Alkaline Phosphatase: 92 U/L (ref 39–117)
BUN: 27 mg/dL — ABNORMAL HIGH (ref 6–23)
CO2: 29 mEq/L (ref 19–32)
Calcium: 9.7 mg/dL (ref 8.4–10.5)
Chloride: 105 mEq/L (ref 96–112)
Creatinine, Ser: 1.26 mg/dL (ref 0.40–1.50)
GFR: 57.64 mL/min — ABNORMAL LOW (ref >60.00)
Glucose, Bld: 157 mg/dL — ABNORMAL HIGH (ref 70–99)
Potassium: 4.9 mEq/L (ref 3.5–5.1)
Sodium: 143 mEq/L (ref 135–145)
Total Bilirubin: 0.3 mg/dL (ref 0.2–1.2)
Total Protein: 7.5 g/dL (ref 6.0–8.3)

## 2022-03-03 LAB — PSA: PSA: 6.61 ng/mL — ABNORMAL HIGH (ref 0.10–4.00)

## 2022-03-03 LAB — CBC
HCT: 37.2 % — ABNORMAL LOW (ref 39.0–52.0)
Hemoglobin: 11.8 g/dL — ABNORMAL LOW (ref 13.0–17.0)
MCHC: 31.7 g/dL (ref 30.0–36.0)
MCV: 86.2 fl (ref 78.0–100.0)
Platelets: 209 10*3/uL (ref 150.0–400.0)
RBC: 4.32 Mil/uL (ref 4.22–5.81)
RDW: 14.1 % (ref 11.5–15.5)
WBC: 5.2 10*3/uL (ref 4.0–10.5)

## 2022-03-03 NOTE — Patient Instructions (Addendum)
Follow-up with [facility] scheduled for 2/29 at 2:15 pm If anything is changing or getting worse in the meantime please let me know   Please stop by the lab and then stop at the ground floor to set up a CT scan of your kidneys

## 2022-03-04 ENCOUNTER — Encounter (HOSPITAL_COMMUNITY)
Admission: RE | Admit: 2022-03-04 | Discharge: 2022-03-04 | Disposition: A | Discharge status: Home / Self Care | Payer: Medicare Other | Source: Ambulatory Visit | Attending: Internal Medicine | Admitting: Internal Medicine

## 2022-03-04 DIAGNOSIS — Z48812 Encounter for surgical aftercare following surgery on the circulatory system: Secondary | ICD-10-CM | POA: Diagnosis not present

## 2022-03-04 DIAGNOSIS — Z951 Presence of aortocoronary bypass graft: Secondary | ICD-10-CM

## 2022-03-04 LAB — URINE CULTURE
MICRO NUMBER:: 14600995
Result:: NO GROWTH
SPECIMEN QUALITY:: ADEQUATE

## 2022-03-04 NOTE — Progress Notes (Signed)
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 Review Code 1- Verbalizes Understanding   Class Start Time 1140   Class Stop Time 1215   Class Time Calculation (min) 35 min    [place] Name 03/04/22 1200     Education   Cardiac Education Topics Pritikin   IT sales professional Nutrition    Nutrition Workshop Fueling a Designer, multimedia   Instruction Review Code 1- Information systems manager   Class Start Time 1147   Class Stop Time 1233   Class Time Calculation (min) 46 min            Core Videos: Exercise    Move It!  Clinical staff conducted group or individual video education with verbal and written material and guidebook.  Patient learns the recommended Pritikin exercise program. Exercise with the goal of living a long, healthy life. Some of the health benefits of exercise include controlled diabetes, healthier blood pressure levels, improved cholesterol levels, improved heart and lung capacity, improved sleep, and better body composition. Everyone should speak with their doctor before starting or changing an exercise routine.  Biomechanical Limitations Clinical staff conducted group or individual video education with verbal and written material and guidebook.  Patient learns how biomechanical limitations can impact exercise and how we can mitigate and possibly overcome limitations to have an impactful and balanced exercise routine.  Body Composition Clinical staff conducted group or individual video education with verbal and written material and guidebook.  Patient learns that body composition (ratio of muscle mass to fat mass) is a key component to assessing overall fitness, rather than body weight alone. Increased fat mass, especially visceral belly fat, can put [place] at increased risk for metabolic syndrome, type 2 diabetes, heart disease, and even death. It is recommended to combine diet and exercise (cardiovascular and resistance training) to improve your body composition. Seek guidance from your physician and exercise physiologist before implementing an exercise routine.  Exercise Action Plan Clinical staff conducted group or individual video education with verbal and written material and guidebook.  Patient learns the recommended strategies to achieve and enjoy long-term  exercise adherence, including variety, self-motivation, self-efficacy, and positive decision making. Benefits of exercise include fitness, good health, weight management, more energy, better sleep, less stress, and overall well-being.  Medical   Heart Disease Risk Reduction Clinical staff conducted group or  individual video education with verbal and written material and guidebook.  Patient learns our heart is our most vital organ as it circulates oxygen, nutrients, white blood cells, and hormones throughout the entire body, and carries waste away. Data supports a plant-based eating plan like the Pritikin Program for its effectiveness in slowing progression of and reversing heart disease. The video provides a number of recommendations to address heart disease.   Metabolic Syndrome and Belly Fat  Clinical staff conducted group or individual video education with verbal and written material and guidebook.  Patient learns what metabolic syndrome is, how it leads to heart disease, and how one can reverse it and keep it from coming back. You have metabolic syndrome if you have 3 of the following 5 criteria: abdominal obesity, high blood pressure, high triglycerides, low HDL cholesterol, and high blood sugar.  Hypertension and Heart Disease Clinical staff conducted group or individual video education with verbal and written material and guidebook.  Patient learns that high blood pressure, or hypertension, is very common in the [place]. Hypertension is largely due to excessive salt intake, but other important risk factors include being overweight, physical inactivity, drinking too much alcohol, smoking, and not eating enough potassium from fruits and vegetables. High blood pressure is a leading risk factor for heart attack, stroke, congestive heart failure, dementia, kidney failure, and premature death. Long-term effects of excessive salt intake include stiffening of the arteries and thickening of heart  muscle and organ damage. Recommendations include ways to reduce hypertension and the risk of heart disease.  Diseases of Our Time - Focusing on Diabetes Clinical staff conducted group or individual video education with verbal and written material and guidebook.  Patient learns why the best way to stop diseases of our time is prevention, through food and other lifestyle changes. Medicine (such as prescription pills and surgeries) is often only a Band-Aid on the problem, not a long-term solution. Most common diseases of our time include obesity, type 2 diabetes, hypertension, heart disease, and cancer. The Pritikin Program is recommended and has been proven to help reduce, reverse, and/or prevent the damaging effects of metabolic syndrome.  Nutrition   Overview of the Pritikin Eating Plan  Clinical staff conducted group or individual video education with verbal and written material and guidebook.  Patient learns about the [place] for disease risk reduction. The [place] emphasizes a wide variety of unrefined, minimally-processed carbohydrates, like fruits, vegetables, whole grains, and legumes. Go, Caution, and Stop food choices are explained. Plant-based and lean animal proteins are emphasized. Rationale provided for low sodium intake for blood pressure control, low added sugars for blood sugar stabilization, and low added fats and oils for coronary artery disease risk reduction and weight management.  Calorie Density  Clinical staff conducted group or individual video education with verbal and written material and guidebook.  Patient learns about calorie density and how it impacts the Pritikin Eating Plan. Knowing the characteristics of the food you choose will help you decide whether those foods will lead to weight gain or weight loss, and whether you want to consume more or less of them. Weight loss is usually a side effect of the Pritikin Eating Plan because of its focus on  low calorie-dense foods.  Label Reading  Clinical staff conducted group or individual video education with verbal and written material and guidebook.  Patient learns about the Pritikin recommended label reading guidelines and corresponding recommendations regarding calorie density, added sugars, sodium content, and whole  grains.  Dining Out - Part 1  Clinical staff conducted group or individual video education with verbal and written material and guidebook.  Patient learns that restaurant meals can be sabotaging because they can be so high in calories, fat, sodium, and/or sugar. Patient learns recommended strategies on how to positively address this and avoid unhealthy pitfalls.  Facts on Fats  Clinical staff conducted group or individual video education with verbal and written material and guidebook.  Patient learns that lifestyle modifications can be just as effective, if not more so, as many medications for lowering your risk of heart disease. A Pritikin lifestyle can help to reduce your risk of inflammation and atherosclerosis (cholesterol build-up, or plaque, in the artery walls). Lifestyle interventions such as dietary choices and physical activity address the cause of atherosclerosis. A review of the types of fats and their impact on blood cholesterol levels, along with dietary recommendations to reduce fat intake is also included.  Nutrition Action Plan  Clinical staff conducted group or individual video education with verbal and written material and guidebook.  Patient learns how to incorporate Pritikin recommendations into their lifestyle. Recommendations include planning and keeping personal health goals in mind as an important part of their success.  Healthy Mind-Set    Healthy Minds, Bodies, Hearts  Clinical staff conducted group or individual video education with verbal and written material and guidebook.  Patient learns how to identify when they are stressed. Video will discuss  the impact of that stress, as well as the many benefits of stress management. Patient will also be introduced to stress management techniques. The way we think, act, and feel has an impact on our hearts.  How Our Thoughts Can Heal Our Hearts  Clinical staff conducted group or individual video education with verbal and written material and guidebook.  Patient learns that negative thoughts can cause depression and anxiety. This can result in negative lifestyle behavior and serious health problems. Cognitive behavioral therapy is an effective method to help control our thoughts in order to change and improve our emotional outlook.  Additional Videos:  Exercise    Improving Performance  Clinical staff conducted group or individual video education with verbal and written material and guidebook.  Patient learns to use a non-linear approach by alternating intensity levels and lengths of time spent exercising to help burn more calories and lose more body fat. Cardiovascular exercise helps improve heart health, metabolism, hormonal balance, blood sugar control, and recovery from fatigue. Resistance training improves strength, endurance, balance, coordination, reaction time, metabolism, and muscle mass. Flexibility exercise improves circulation, posture, and balance. Seek guidance from your physician and exercise physiologist before implementing an exercise routine and learn your capabilities and proper form for all exercise.  Introduction to Yoga  Clinical staff conducted group or individual video education with verbal and written material and guidebook.  Patient learns about yoga, a discipline of the coming together of mind, breath, and body. The benefits of yoga include improved flexibility, improved range of motion, better posture and core strength, increased lung function, weight loss, and positive self-image. Yoga's heart health benefits include lowered blood pressure, healthier heart rate, decreased  cholesterol and triglyceride levels, improved immune function, and reduced stress. Seek guidance from your physician and exercise physiologist before implementing an exercise routine and learn your capabilities and proper form for all exercise.  Medical   Aging: Enhancing Your Quality of Life  Clinical staff conducted group or individual video education with verbal and written material and guidebook.  Patient learns key strategies and recommendations to stay in good physical health and enhance quality of life, such as prevention strategies, having an advocate, securing a [place], and keeping a list of medications and system for tracking them. It also discusses how to avoid risk for bone loss.  Biology of Weight Control  Clinical staff conducted group or individual video education with verbal and written material and guidebook.  Patient learns that weight gain occurs because we consume more calories than we burn (eating more, moving less). Even if your body weight is normal, you may have higher ratios of fat compared to muscle mass. Too much body fat puts you at increased risk for cardiovascular disease, heart attack, stroke, type 2 diabetes, and obesity-related cancers. In addition to exercise, following the [place] can help reduce your risk.  Decoding Lab Results  Clinical staff conducted group or individual video education with verbal and written material and guidebook.  Patient learns that lab test reflects one measurement whose values change over time and are influenced by many factors, including medication, stress, sleep, exercise, food, hydration, pre-existing medical conditions, and more. It is recommended to use the knowledge from this video to become more involved with your lab results and evaluate your numbers to speak with your doctor.   Diseases of Our Time - Overview  Clinical staff conducted group or individual video education with verbal  and written material and guidebook.  Patient learns that according to the CDC, 50% to 70% of chronic diseases (such as obesity, type 2 diabetes, elevated lipids, hypertension, and heart disease) are avoidable through lifestyle improvements including healthier food choices, listening to satiety cues, and increased physical activity.  Sleep Disorders Clinical staff conducted group or individual video education with verbal and written material and guidebook.  Patient learns how good quality and duration of sleep are important to overall health and well-being. Patient also learns about sleep disorders and how they impact health along with recommendations to address them, including discussing with a physician.  Nutrition  Dining Out - Part 2 Clinical staff conducted group or individual video education with verbal and written material and guidebook.  Patient learns how to plan ahead and communicate in order to maximize their dining experience in a healthy and nutritious manner. Included are recommended food choices based on the type of restaurant the patient is visiting.   Fueling a Best boy conducted group or individual video education with verbal and written material and guidebook.  There is a strong connection between our food choices and our health. Diseases like obesity and type 2 diabetes are very prevalent and are in large-part due to lifestyle choices. The Pritikin Eating Plan provides plenty of food and hunger-curbing satisfaction. It is easy to follow, affordable, and helps reduce health risks.  Menu Workshop  Clinical staff conducted group or individual video education with verbal and written material and guidebook.  Patient learns that restaurant meals can sabotage health goals because they are often packed with calories, fat, sodium, and sugar. Recommendations include strategies to plan ahead and to communicate with the manager, chef, or server to help order a healthier  meal.  Planning Your Eating Strategy  Clinical staff conducted group or individual video education with verbal and written material and guidebook.  Patient learns about the [place] and its benefit of reducing the risk of disease. The [place] does not focus on calories. Instead, it emphasizes high-quality, nutrient-rich  foods. By knowing the characteristics of the foods, we choose, we can determine their calorie density and make informed decisions.  Targeting Your Nutrition Priorities  Clinical staff conducted group or individual video education with verbal and written material and guidebook.  Patient learns that lifestyle habits have a tremendous impact on disease risk and progression. This video provides eating and physical activity recommendations based on your personal health goals, such as reducing LDL cholesterol, losing weight, preventing or controlling type 2 diabetes, and reducing high blood pressure.  Vitamins and Minerals  Clinical staff conducted group or individual video education with verbal and written material and guidebook.  Patient learns different ways to obtain key vitamins and minerals, including through a recommended healthy diet. It is important to discuss all supplements you take with your doctor.   Healthy Mind-Set    Smoking Cessation  Clinical staff conducted group or individual video education with verbal and written material and guidebook.  Patient learns that cigarette smoking and tobacco addiction pose a serious health risk which affects millions of people. Stopping smoking will significantly reduce the risk of heart disease, lung disease, and many forms of cancer. Recommended strategies for quitting are covered, including working with your doctor to develop a successful plan.  Culinary   Becoming a Financial trader conducted group or individual video education with verbal and written material and guidebook.  Patient learns  that cooking at home can be healthy, cost-effective, quick, and puts them in control. Keys to cooking healthy recipes will include looking at your recipe, assessing your equipment needs, planning ahead, making it simple, choosing cost-effective seasonal ingredients, and limiting the use of added fats, salts, and sugars.  Cooking - Breakfast and Snacks  Clinical staff conducted group or individual video education with verbal and written material and guidebook.  Patient learns how important breakfast is to satiety and nutrition through the entire day. Recommendations include key foods to eat during breakfast to help stabilize blood sugar levels and to prevent overeating at meals later in the day. Planning ahead is also a key component.  Cooking - Human resources officer conducted group or individual video education with verbal and written material and guidebook.  Patient learns eating strategies to improve overall health, including an approach to cook more at home. Recommendations include thinking of animal protein as a side on your plate rather than center stage and focusing instead on lower calorie dense options like vegetables, fruits, whole grains, and plant-based proteins, such as beans. Making sauces in large quantities to freeze for later and leaving the skin on your vegetables are also recommended to maximize your experience.  Cooking - Healthy Salads and Dressing Clinical staff conducted group or individual video education with verbal and written material and guidebook.  Patient learns that vegetables, fruits, whole grains, and legumes are the foundations of the [place]. Recommendations include how to incorporate each of these in flavorful and healthy salads, and how to create homemade salad dressings. Proper handling of ingredients is also covered. Cooking - Soups and [employer] - Soups and Desserts Clinical staff conducted group or individual video education with  verbal and written material and guidebook.  Patient learns that Pritikin soups and desserts make for easy, nutritious, and delicious snacks and meal components that are low in sodium, fat, sugar, and calorie density, while high in vitamins, minerals, and filling fiber. Recommendations include simple and healthy ideas for soups and desserts.   Overview  The Pritikin Solution Program Overview Clinical staff conducted group or individual video education with verbal and written material and guidebook.  Patient learns that the results of the [place] Program have been documented in more than 100 articles published in peer-reviewed journals, and the benefits include reducing risk factors for (and, in some cases, even reversing) high cholesterol, high blood pressure, type 2 diabetes, obesity, and more! An overview of the three key pillars of the Pritikin Program will be covered: eating well, doing regular exercise, and having a healthy mind-set.  WORKSHOPS  Exercise: Exercise Basics: Building Your Action Plan Clinical staff led group instruction and group discussion with PowerPoint presentation and patient guidebook. To enhance the learning environment the use of posters, models and videos may be added. At the conclusion of this workshop, patients will comprehend the difference between physical activity and exercise, as well as the benefits of incorporating both, into their routine. Patients will understand the FITT (Frequency, Intensity, Time, and Type) principle and how to use it to build an exercise action plan. In addition, safety concerns and other considerations for exercise and cardiac rehab will be addressed by the presenter. The purpose of this lesson is to promote a comprehensive and effective weekly exercise routine in order to improve patients' overall level of fitness.   Managing Heart Disease: Your Path to a Healthier Heart Clinical staff led group instruction and group discussion with  PowerPoint presentation and patient guidebook. To enhance the learning environment the use of posters, models and videos may be added.At the conclusion of this workshop, patients will understand the anatomy and physiology of the heart. Additionally, they will understand how Pritikin's three pillars impact the risk factors, the progression, and the management of heart disease.  The purpose of this lesson is to provide a high-level overview of the heart, heart disease, and how the Pritikin lifestyle positively impacts risk factors.  Exercise Biomechanics Clinical staff led group instruction and group discussion with PowerPoint presentation and patient guidebook. To enhance the learning environment the use of posters, models and videos may be added. Patients will learn how the structural parts of their bodies function and how these functions impact their daily activities, movement, and exercise. Patients will learn how to promote a neutral spine, learn how to manage pain, and identify ways to improve their physical movement in order to promote healthy living. The purpose of this lesson is to expose patients to common physical limitations that impact physical activity. Participants will learn practical ways to adapt and manage aches and pains, and to minimize their effect on regular exercise. Patients will learn how to maintain good posture while sitting, walking, and lifting.  Balance Training and Fall Prevention  Clinical staff led group instruction and group discussion with PowerPoint presentation and patient guidebook. To enhance the learning environment the use of posters, models and videos may be added. At the conclusion of this workshop, patients will understand the importance of their sensorimotor skills (vision, proprioception, and the vestibular system) in maintaining their ability to balance as they age. Patients will apply a variety of balancing exercises that are appropriate for their  current level of function. Patients will understand the common causes for poor balance, possible solutions to these problems, and ways to modify their physical environment in order to minimize their fall risk. The purpose of this lesson is to teach patients about the importance of maintaining balance as they age and ways to minimize their risk of falling.  WORKSHOPS   Nutrition:  Fueling  a Healthy Body Clinical staff led group instruction and group discussion with PowerPoint presentation and patient guidebook. To enhance the learning environment the use of posters, models and videos may be added. Patients will review the foundational principles of the [place] and understand what constitutes a serving size in each of the food groups. Patients will also learn Pritikin-friendly foods that are better choices when away from home and review make-ahead meal and snack options. Calorie density will be reviewed and applied to three nutrition priorities: weight maintenance, weight loss, and weight gain. The purpose of this lesson is to reinforce (in a group setting) the key concepts around what patients are recommended to eat and how to apply these guidelines when away from home by planning and selecting Pritikin-friendly options. Patients will understand how calorie density may be adjusted for different weight management goals.  Mindful Eating  Clinical staff led group instruction and group discussion with PowerPoint presentation and patient guidebook. To enhance the learning environment the use of posters, models and videos may be added. Patients will briefly review the concepts of the [place] and the importance of low-calorie dense foods. The concept of mindful eating will be introduced as well as the importance of paying attention to internal hunger signals. Triggers for non-hunger eating and techniques for dealing with triggers will be explored. The purpose of this lesson is to provide  patients with the opportunity to review the basic principles of the [place], discuss the value of eating mindfully and how to measure internal cues of hunger and fullness using the Hunger Scale. Patients will also discuss reasons for non-hunger eating and learn strategies to use for controlling emotional eating.  Targeting Your Nutrition Priorities Clinical staff led group instruction and group discussion with PowerPoint presentation and patient guidebook. To enhance the learning environment the use of posters, models and videos may be added. Patients will learn how to determine their genetic susceptibility to disease by reviewing their family history. Patients will gain insight into the importance of diet as part of an overall healthy lifestyle in mitigating the impact of genetics and other environmental insults. The purpose of this lesson is to provide patients with the opportunity to assess their personal nutrition priorities by looking at their family history, their own health history and current risk factors. Patients will also be able to discuss ways of prioritizing and modifying the [place] for their highest risk areas  Menu  Clinical staff led group instruction and group discussion with PowerPoint presentation and patient guidebook. To enhance the learning environment the use of posters, models and videos may be added. Using menus brought in from [employer], or printed from [employer], patients will apply the [place] dining out guidelines that were presented in the [employer] video. Patients will also be able to practice these guidelines in a variety of provided scenarios. The purpose of this lesson is to provide patients with the opportunity to practice hands-on learning of the [place] with actual menus and practice scenarios.  Label Reading Clinical staff led group instruction and group discussion with PowerPoint  presentation and patient guidebook. To enhance the learning environment the use of posters, models and videos may be added. Patients will review and discuss the [facility] label reading guidelines presented in [employer]'s Label Reading Educational series video. Using fool labels brought in from local grocery stores and markets, patients will apply the label reading guidelines and determine if the packaged food  meet the [facility] guidelines. The purpose of this lesson is to provide patients with the opportunity to review, discuss, and practice hands-on learning of the [facility] Label Reading guidelines with actual packaged food labels. [place] Workshops are designed to teach patients ways to prepare quick, simple, and affordable recipes at home. The importance of nutrition's role in chronic disease risk reduction is reflected in its emphasis in the overall [facility] program. By learning how to prepare essential core [facility] Eating Plan recipes, patients will increase control over what they eat; be able to customize the flavor of foods without the use of added salt, sugar, or fat; and improve the quality of the food they consume. By learning a set of core recipes which are easily assembled, quickly prepared, and affordable, patients are more likely to prepare more healthy foods at home. These workshops focus on convenient breakfasts, simple entres, side dishes, and desserts which can be prepared with minimal effort and are consistent with nutrition recommendations for cardiovascular risk reduction. Cooking [employer] are taught by a Engineer, materials (RD) who has been trained by the [employer]. The chef or RD has a clear understanding of the importance of minimizing - if not completely eliminating - added fat, sugar, and sodium in recipes. Throughout the series of [place] Workshop sessions, patients will learn about healthy ingredients and  efficient methods of cooking to build confidence in their capability to prepare    Cooking School weekly topics:  Adding Flavor- Sodium-Free  Fast and Healthy Breakfasts  Powerhouse Plant-Based Proteins  Satisfying Salads and Dressings  Simple Sides and Sauces  International Cuisine-Spotlight on the [place] Zones  Delicious Desserts  Savory Soups  Efficiency Cooking - Meals in a Snap  Tasty Appetizers and Snacks  Comforting Weekend Breakfasts  One-Pot Wonders   Fast Evening Meals  Easy [place] (Psychosocial): New Thoughts, New Behaviors Clinical staff led group instruction and group discussion with PowerPoint presentation and patient guidebook. To enhance the learning environment the use of posters, models and videos may be added. Patients will learn and practice techniques for developing effective health and lifestyle goals. Patients will be able to effectively apply the goal setting process learned to develop at least one new personal goal.  The purpose of this lesson is to expose patients to a new skill set of behavior modification techniques such as techniques setting SMART goals, overcoming barriers, and achieving new thoughts and new behaviors.  Managing Moods and Relationships Clinical staff led group instruction and group discussion with PowerPoint presentation and patient guidebook. To enhance the learning environment the use of posters, models and videos may be added. Patients will learn how emotional and chronic stress factors can impact their health and relationships. They will learn healthy ways to manage their moods and utilize positive coping mechanisms. In addition, ICR patients will learn ways to improve communication skills. The purpose of this lesson is to expose patients to ways of understanding how one's mood and health are intimately connected. Developing a healthy outlook can help build positive  relationships and connections with others. Patients will understand the importance of utilizing effective communication skills that include actively listening and being heard. They will learn and understand the importance of the "4 Cs" and especially Connections in fostering of a Healthy Mind-Set.  Healthy Sleep for a Healthy Heart Clinical staff led group instruction and group discussion with PowerPoint presentation and  patient guidebook. To enhance the learning environment the use of posters, models and videos may be added. At the conclusion of this workshop, patients will be able to demonstrate knowledge of the importance of sleep to overall health, well-being, and quality of life. They will understand the symptoms of, and treatments for, common sleep disorders. Patients will also be able to identify daytime and nighttime behaviors which impact sleep, and they will be able to apply these tools to help manage sleep-related challenges. The purpose of this lesson is to provide patients with a general overview of sleep and outline the importance of quality sleep. Patients will learn about a few of the most common sleep disorders. Patients will also be introduced to the concept of "sleep hygiene," and discover ways to self-manage certain sleeping problems through simple daily behavior changes. Finally, the workshop will motivate patients by clarifying the links between quality sleep and their goals of heart-healthy living.   Recognizing and Reducing Stress Clinical staff led group instruction and group discussion with PowerPoint presentation and patient guidebook. To enhance the learning environment the use of posters, models and videos may be added. At the conclusion of this workshop, patients will be able to understand the types of stress reactions, differentiate between acute and chronic stress, and recognize the impact that chronic stress has on their health. They will also be able to apply different coping  mechanisms, such as reframing negative self-talk. Patients will have the opportunity to practice a variety of stress management techniques, such as deep abdominal breathing, progressive muscle relaxation, and/or guided imagery.  The purpose of this lesson is to educate patients on the role of stress in their lives and to provide healthy techniques for coping with it.  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 Doe not have a prosthesis   Learning Preferences Audio;Individual Instruction;Group Instruction;Video;Verbal Instruction;Skilled Demonstration;Pictoral;Written Material             Education Topics:  Knowledge Questionnaire Score:  Knowledge Questionnaire Score - 02/15/22 1420       Knowledge Questionnaire Score   Pre Score 18/24             Core Components/Risk Factors/Patient Goals at Admission:  Personal Goals and Risk Factors at Admission - 02/15/22 1357       Core Components/Risk Factors/Patient Goals on Admission    Weight Management Yes;Weight Maintenance    Intervention Weight Management: Develop a combined nutrition and exercise program designed to reach desired caloric intake, while maintaining appropriate intake of nutrient and fiber, sodium and fats, and appropriate energy expenditure required for the weight goal.;Weight Management: Provide education and appropriate resources to help participant work on and attain dietary goals.    Expected Outcomes Short Term: Continue to assess and modify interventions until short term weight is achieved;Long Term: Adherence to nutrition and physical activity/exercise program aimed toward attainment of established weight goal;Understanding recommendations for meals to include 15-35% energy as protein, 25-35% energy from fat, 35-60% energy from carbohydrates, less  than '200mg'$  of dietary cholesterol, 20-35 gm of total fiber daily    Diabetes Yes    Intervention Provide education about signs/symptoms and action to take for hypo/hyperglycemia.;Provide education about proper nutrition, including hydration, and aerobic/resistive exercise prescription along with prescribed medications to achieve blood glucose in normal ranges: Fasting glucose 65-99 mg/dL  Expected Outcomes Short Term: Participant verbalizes understanding of the signs/symptoms and immediate care of hyper/hypoglycemia, proper foot care and importance of medication, aerobic/resistive exercise and nutrition plan for blood glucose control.;Long Term: Attainment of HbA1C < 7%.    Hypertension Yes    Intervention Provide education on lifestyle modifcations including regular physical activity/exercise, weight management, moderate sodium restriction and increased consumption of fresh fruit, vegetables, and low fat dairy, alcohol moderation, and smoking cessation.;Monitor prescription use compliance.    Expected Outcomes Short Term: Continued assessment and intervention until BP is < 140/47m HG in hypertensive participants. < 130/84mHG in hypertensive participants with diabetes, heart failure or chronic kidney disease.;Long Term: Maintenance of blood pressure at goal levels.    Lipids Yes    Intervention Provide education and support for participant on nutrition & aerobic/resistive exercise along with prescribed medications to achieve LDL '70mg'$ , HDL >'40mg'$ .    Expected Outcomes Short Term: Participant states understanding of desired cholesterol values and is compliant with medications prescribed. Participant is following exercise prescription and nutrition guidelines.;Long Term: Cholesterol controlled with medications as prescribed, with individualized exercise RX and with personalized nutrition plan. Value goals: LDL < '70mg'$ , HDL > 40 mg.    Stress Yes    Intervention Offer individual and/or small group  education and counseling on adjustment to heart disease, stress management and health-related lifestyle change. Teach and support self-help strategies.;Refer participants experiencing significant psychosocial distress to appropriate mental health specialists for further evaluation and treatment. When possible, include family members and significant others in education/counseling sessions.    Expected Outcomes Short Term: Participant demonstrates changes in health-related behavior, relaxation and other stress management skills, ability to obtain effective social support, and compliance with psychotropic medications if prescribed.;Long Term: Emotional wellbeing is indicated by absence of clinically significant psychosocial distress or social isolation.    Personal Goal Other Yes    Personal Goal Increase mobility to be able to return to golfing.    Intervention Provide aerobic and stretching exercise routine to help improve mobility and flexibilty to be able to return to golf once cleared by cardiology.    Expected Outcomes Patient will have to mobility to be able to resume golfing once cleared by cardiologist to do so.             Core Components/Risk Factors/Patient Goals Review:   Goals and Risk Factor Review     Row Name 02/28/22 0842             Core Components/Risk Factors/Patient Goals Review   Personal Goals Review Weight Management/Obesity;Stress;Hypertension;Lipids;Diabetes       Review HoGladstonetarted intensive cardiac rehab on 02/21/22 and is off to a good start to exercise. Vital signs and CBG's have been stable       Expected Outcomes HoLeonelill continue to participate in intensive cardiac rehab for exercise, nutrition and lifestyle modifications                Core Components/Risk Factors/Patient Goals at Discharge (Final Review):   Goals and Risk Factor Review - 02/28/22 0842       Core Components/Risk Factors/Patient Goals Review   Personal Goals Review Weight  Management/Obesity;Stress;Hypertension;Lipids;Diabetes    Review HoJunioustarted intensive cardiac rehab on 02/21/22 and is off to a good start to exercise. Vital signs and CBG's have been stable    Expected Outcomes HoLarayill continue to participate in intensive cardiac rehab for exercise, nutrition and lifestyle modifications  ITP Comments:  ITP Comments     Row Name 02/15/22 1014 02/22/22 1708         ITP Comments Medical Director- Dr. Fransico Him, MD, Introduction to [facility] Education Program/ Intensive Cardiac Rehab. Initial Orientation Packet reviewed with the Patient 30 Day ITP review. Taison started intensive cardiac rehab on 02/21/22 and did well with exercise               Comments: See ITP Comments

## 2022-03-04 NOTE — Progress Notes (Signed)
QUALITY OF LIFE SCORE REVIEW  Pt completed Quality of Life survey as a participant in Cardiac Rehab.  Scores 21.0 or below are considered low.  Pt score very low in several areas Overall 18.56, Health and Function 15.2, socioeconomic 25.2, physiological and spiritual 18.86, family 21.6. Patient quality of life slightly altered by physical constraints which limits ability to perform as prior to recent cardiac illness.Andrew [name] has started driving. Andrew [name]'s wife had shoulder surgery last week he is taking care of her. Andrew [name] says that he is feeling better since he has been participating in cardiac rehab and is feeling stronger.  Offered emotional support and reassurance.  Will continue to monitor and intervene as necessary. Harrell Gave RN BSN

## 2022-03-07 ENCOUNTER — Encounter (HOSPITAL_COMMUNITY)
Admission: RE | Admit: 2022-03-07 | Discharge: 2022-03-07 | Disposition: A | Discharge status: Home / Self Care | Payer: Medicare Other | Source: Ambulatory Visit | Attending: Internal Medicine | Admitting: Internal Medicine

## 2022-03-07 DIAGNOSIS — Z951 Presence of aortocoronary bypass graft: Secondary | ICD-10-CM | POA: Diagnosis not present

## 2022-03-07 DIAGNOSIS — Z48812 Encounter for surgical aftercare following surgery on the circulatory system: Secondary | ICD-10-CM | POA: Diagnosis not present

## 2022-03-08 ENCOUNTER — Ambulatory Visit (HOSPITAL_BASED_OUTPATIENT_CLINIC_OR_DEPARTMENT_OTHER)
Admission: RE | Admit: 2022-03-08 | Discharge: 2022-03-08 | Disposition: A | Discharge status: Home / Self Care | Payer: Medicare Other | Source: Ambulatory Visit | Attending: Family Medicine | Admitting: Family Medicine

## 2022-03-08 DIAGNOSIS — N3289 Other specified disorders of bladder: Secondary | ICD-10-CM | POA: Diagnosis not present

## 2022-03-08 DIAGNOSIS — R1030 Lower abdominal pain, unspecified: Secondary | ICD-10-CM | POA: Insufficient documentation

## 2022-03-08 DIAGNOSIS — N281 Cyst of kidney, acquired: Secondary | ICD-10-CM | POA: Diagnosis not present

## 2022-03-09 ENCOUNTER — Encounter (HOSPITAL_COMMUNITY)
Admission: RE | Admit: 2022-03-09 | Discharge: 2022-03-09 | Disposition: A | Discharge status: Home / Self Care | Payer: Medicare Other | Source: Ambulatory Visit | Attending: Internal Medicine | Admitting: Internal Medicine

## 2022-03-09 DIAGNOSIS — Z48812 Encounter for surgical aftercare following surgery on the circulatory system: Secondary | ICD-10-CM | POA: Diagnosis not present

## 2022-03-09 DIAGNOSIS — Z951 Presence of aortocoronary bypass graft: Secondary | ICD-10-CM | POA: Diagnosis not present

## 2022-03-10 ENCOUNTER — Ambulatory Visit: Payer: Medicare Other | Admitting: Neurology

## 2022-03-10 DIAGNOSIS — N401 Enlarged prostate with lower urinary tract symptoms: Secondary | ICD-10-CM | POA: Diagnosis not present

## 2022-03-10 DIAGNOSIS — R310 Gross hematuria: Secondary | ICD-10-CM | POA: Diagnosis not present

## 2022-03-10 DIAGNOSIS — R351 Nocturia: Secondary | ICD-10-CM | POA: Diagnosis not present

## 2022-03-10 DIAGNOSIS — R972 Elevated prostate specific antigen [PSA]: Secondary | ICD-10-CM | POA: Diagnosis not present

## 2022-03-11 ENCOUNTER — Telehealth: Payer: Self-pay | Admitting: *Deleted

## 2022-03-11 ENCOUNTER — Encounter (HOSPITAL_COMMUNITY): Payer: Medicare Other

## 2022-03-11 NOTE — Telephone Encounter (Signed)
CT Renal study back per [facility],

## 2022-03-14 ENCOUNTER — Encounter (HOSPITAL_COMMUNITY)
Admission: RE | Admit: 2022-03-14 | Discharge: 2022-03-14 | Disposition: A | Discharge status: Home / Self Care | Payer: Medicare Other | Source: Ambulatory Visit | Attending: Internal Medicine | Admitting: Internal Medicine

## 2022-03-14 DIAGNOSIS — Z951 Presence of aortocoronary bypass graft: Secondary | ICD-10-CM | POA: Diagnosis not present

## 2022-03-14 DIAGNOSIS — Z48812 Encounter for surgical aftercare following surgery on the circulatory system: Secondary | ICD-10-CM | POA: Diagnosis not present

## 2022-03-16 ENCOUNTER — Encounter (HOSPITAL_COMMUNITY)
Admission: RE | Admit: 2022-03-16 | Discharge: 2022-03-16 | Disposition: A | Discharge status: Home / Self Care | Payer: Medicare Other | Source: Ambulatory Visit | Attending: Internal Medicine | Admitting: Internal Medicine

## 2022-03-16 DIAGNOSIS — Z951 Presence of aortocoronary bypass graft: Secondary | ICD-10-CM

## 2022-03-16 DIAGNOSIS — Z48812 Encounter for surgical aftercare following surgery on the circulatory system: Secondary | ICD-10-CM | POA: Diagnosis not present

## 2022-03-18 ENCOUNTER — Other Ambulatory Visit: Payer: Self-pay | Admitting: Family Medicine

## 2022-03-18 ENCOUNTER — Encounter (HOSPITAL_COMMUNITY)
Admission: RE | Admit: 2022-03-18 | Discharge: 2022-03-18 | Disposition: A | Discharge status: Home / Self Care | Payer: Medicare Other | Source: Ambulatory Visit | Attending: Internal Medicine | Admitting: Internal Medicine

## 2022-03-18 DIAGNOSIS — Z48812 Encounter for surgical aftercare following surgery on the circulatory system: Secondary | ICD-10-CM | POA: Diagnosis not present

## 2022-03-18 DIAGNOSIS — I10 Essential (primary) hypertension: Secondary | ICD-10-CM

## 2022-03-18 DIAGNOSIS — Z951 Presence of aortocoronary bypass graft: Secondary | ICD-10-CM | POA: Diagnosis not present

## 2022-03-21 ENCOUNTER — Encounter (HOSPITAL_COMMUNITY)
Admission: RE | Admit: 2022-03-21 | Discharge: 2022-03-21 | Disposition: A | Discharge status: Home / Self Care | Payer: Medicare Other | Source: Ambulatory Visit | Attending: Internal Medicine | Admitting: Internal Medicine

## 2022-03-21 DIAGNOSIS — Z48812 Encounter for surgical aftercare following surgery on the circulatory system: Secondary | ICD-10-CM | POA: Diagnosis not present

## 2022-03-21 DIAGNOSIS — Z951 Presence of aortocoronary bypass graft: Secondary | ICD-10-CM

## 2022-03-23 ENCOUNTER — Encounter (HOSPITAL_COMMUNITY): Payer: Medicare Other

## 2022-03-25 ENCOUNTER — Encounter (HOSPITAL_COMMUNITY)
Admission: RE | Admit: 2022-03-25 | Discharge: 2022-03-25 | Disposition: A | Discharge status: Home / Self Care | Payer: Medicare Other | Source: Ambulatory Visit | Attending: Internal Medicine | Admitting: Internal Medicine

## 2022-03-25 DIAGNOSIS — Z48812 Encounter for surgical aftercare following surgery on the circulatory system: Secondary | ICD-10-CM | POA: Diagnosis not present

## 2022-03-25 DIAGNOSIS — Z951 Presence of aortocoronary bypass graft: Secondary | ICD-10-CM

## 2022-03-28 ENCOUNTER — Encounter (HOSPITAL_COMMUNITY)
Admission: RE | Admit: 2022-03-28 | Discharge: 2022-03-28 | Disposition: A | Discharge status: Home / Self Care | Payer: Medicare Other | Source: Ambulatory Visit | Attending: Internal Medicine | Admitting: Internal Medicine

## 2022-03-28 DIAGNOSIS — Z951 Presence of aortocoronary bypass graft: Secondary | ICD-10-CM

## 2022-03-28 DIAGNOSIS — Z48812 Encounter for surgical aftercare following surgery on the circulatory system: Secondary | ICD-10-CM | POA: Diagnosis not present

## 2022-03-28 LAB — GLUCOSE, CAPILLARY: Glucose-Capillary: 155 mg/dL — ABNORMAL HIGH (ref 70–99)

## 2022-03-30 ENCOUNTER — Encounter (HOSPITAL_COMMUNITY)
Admission: RE | Admit: 2022-03-30 | Discharge: 2022-03-30 | Disposition: A | Discharge status: Home / Self Care | Payer: Medicare Other | Source: Ambulatory Visit | Attending: Internal Medicine | Admitting: Internal Medicine

## 2022-03-30 DIAGNOSIS — Z48812 Encounter for surgical aftercare following surgery on the circulatory system: Secondary | ICD-10-CM | POA: Diagnosis not present

## 2022-03-30 DIAGNOSIS — Z951 Presence of aortocoronary bypass graft: Secondary | ICD-10-CM

## 2022-03-30 NOTE — Progress Notes (Signed)
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 post shoulder surgery. [place] has not mentioned any concerns or stressors during exercise at intensive cardiac rehab      Expected Outcomes -- Tymel will have decreased concerns or stressors upon completion of intensive cardiac rehab.      Interventions Stress management education Stress management education      Continue Psychosocial Services  Follow up required by staff No Follow up required        Initial Review   Source of Stress Concerns Chronic Illness Chronic Illness      Comments will continue to monitor and offer suport as needed will continue to monitor and offer suport as needed               Psychosocial Discharge  (Final Psychosocial Re-Evaluation):  Psychosocial Re-Evaluation - 03/29/22 1640       Psychosocial Re-Evaluation   Current issues with Current Stress Concerns    Comments Quality of life survey reviewed. Howar's wife is recovering post shoulder surgery. [place] has not mentioned any concerns or stressors during exercise at intensive cardiac rehab    Expected [place] will have decreased concerns or stressors upon completion of intensive cardiac rehab.    Interventions Stress management education    Continue Psychosocial Services  No Follow up required      Initial Review   Source of Stress Concerns Chronic Illness    Comments will continue to monitor and offer suport as needed             Vocational Rehabilitation: Provide vocational rehab assistance to qualifying candidates.   Vocational Rehab Evaluation & Intervention:  Vocational Rehab - 02/15/22 1354       Initial Vocational Rehab Evaluation & Intervention   Assessment shows need for Vocational Rehabilitation No   Jada is retired and does not need vocational rehab at this time            Education: Education Goals: Education classes will be provided on a weekly basis, covering required topics. Participant will state understanding/return demonstration of topics presented.    Education     [place] Name 02/21/22 1200     Education   Cardiac Education Topics Pritikin   Environmental consultant Psychosocial   Psychosocial Workshop Healthy Sleep for a Healthy Heart   Instruction Review Code 1- Verbalizes Understanding   Class Start Time 1155   Class Stop Time 1238   Class Time Calculation (min) 43 min    [place] Name 02/23/22 1600     Education   Cardiac Education Topics Pritikin   Financial trader   Weekly Topic Comforting Weekend Breakfasts   Instruction Review Code 1- Verbalizes Understanding   Class Start  Time 1155   Class Stop Time 1232   Class Time Calculation (min) 37 min    [place] Name 02/25/22 1200     Education   Cardiac Education Topics Pritikin   Lexicographer Nutrition   Nutrition Dining Out - Part 1   Instruction Review Code 1- Verbalizes Understanding   Class Start Time 1155   Class Stop Time 1230   Class Time Calculation (min) 35 min  [place] Name 02/28/22 1300     Education   Cardiac Education Topics Pritikin   Academic librarian Exercise Education   Exercise Education Biomechanial Limitations   Instruction Review Code 1- Verbalizes Understanding   Class Start Time 1150   Class Stop Time 1231   Class Time Calculation (min) 41 min    [place] Name 03/02/22 1215     Education   Cardiac Education Topics Pritikin   Financial trader   Weekly Topic Fast Evening Meals   Instruction Review Code 1- Verbalizes Understanding   Class Start Time 1140   Class Stop Time 1215   Class Time Calculation (min) 35 min    [place] Name 03/04/22 1200     Education   Cardiac Education Topics Pritikin   IT sales professional Nutrition   Nutrition Workshop Fueling a Designer, multimedia   Instruction Review Code 1- Information systems manager   Class Start Time 1147   Class Stop Time 1233   Class Time Calculation (min) 46 min    [place] Name 03/07/22 1300     Education   Cardiac Education Topics Pritikin   Financial trader   Weekly Topic International Cuisine- Spotlight on the [place]   Instruction Review Code 1- Verbalizes Understanding   Class Start Time 1150   Class Stop Time 1230   Class Time Calculation (min) 40 min    [place] Name 03/09/22 1300     Education   Cardiac Education Topics Pritikin   Chiropractor Nutrition   Nutrition Facts on Fat   Instruction Review Code 1- Verbalizes Understanding   Class Start Time 1145   Class Stop Time 1230   Class Time Calculation (min) 45 min    [place] Name 03/14/22 1600     Education   Cardiac Education Topics Pritikin   Environmental consultant Psychosocial   Psychosocial Workshop Other  From [employer] to Heart   Instruction Review Code 1- Verbalizes Understanding   Class Start Time 1145   Class Stop Time 1231   Class Time Calculation (min) 46 min    [place] Name 03/16/22 1600     Education   Cardiac Education Topics Pritikin   Financial trader   Weekly Topic Adding Flavor - Sodium-Free   Instruction Review Code 1- Verbalizes Understanding   Class Start Time 1150   Class Stop Time 1223   Class Time Calculation (min) 33 min    [place] Name 03/18/22 1200     Education   Cardiac Education Topics Pritikin   Architect Education   General Education Heart Disease Risk Reduction   Instruction Review Code 1- Verbalizes Understanding   Class Start Time 1147   Class Stop Time 1222   Class Time Calculation (min) 35 min    [place] Name 03/21/22 1400     Education   Cardiac Education Topics [place]   [place]  Educator Exercise Printmaker Exercise   Exercise Workshop Hotel manager and Fall Prevention   Instruction Review Code 1- Verbalizes Understanding   Class Start Time 1145   Class Stop Time 1230   Class Time Calculation (min) 45 min    [place] Name 03/25/22 1200     Education   Cardiac Education Topics Pritikin   Lexicographer Nutrition   Nutrition Overview of the Pritikin Eating Plan   Instruction Review Code 1- Verbalizes Understanding   Class Start Time  1150   Class Stop Time 1230   Class Time Calculation (min) 40 min    [place] Name 03/28/22 1700     Education   Cardiac Education Topics Pritikin   [employer]     Workshops   Educator Exercise Physiologist   [place] Psychosocial   Psychosocial Workshop Recognizing and Reducing Stress   Instruction Review Code 1- Verbalizes Understanding   Class Start Time 1148   Class Stop Time 1233   Class Time Calculation (min) 45 min            Core Videos: Exercise    Move It!  Clinical staff conducted group or individual video education with verbal and written material and guidebook.  Patient learns the recommended Pritikin exercise program. Exercise with the goal of living a long, healthy life. Some of the health benefits of exercise include controlled diabetes, healthier blood pressure levels, improved cholesterol levels, improved heart and lung capacity, improved sleep, and better body composition. Everyone should speak with their doctor before starting or changing an exercise routine.  Biomechanical Limitations Clinical staff conducted group or individual video education with verbal and written material and guidebook.  Patient learns how biomechanical limitations can impact exercise and how we can mitigate and possibly overcome limitations to have an impactful and balanced exercise routine.  Body Composition Clinical staff conducted group or individual video education with verbal and written material and guidebook.  Patient learns that body composition (ratio of muscle mass to fat mass) is a key component to assessing overall fitness, rather than body weight alone. Increased fat mass, especially visceral belly fat, can put [place] at increased risk for metabolic syndrome, type 2 diabetes, heart disease, and even death. It is recommended to combine diet and exercise (cardiovascular and resistance training) to improve your body composition. Seek guidance from your physician and exercise  physiologist before implementing an exercise routine.  Exercise Action Plan Clinical staff conducted group or individual video education with verbal and written material and guidebook.  Patient learns the recommended strategies to achieve and enjoy long-term exercise adherence, including variety, self-motivation, self-efficacy, and positive decision making. Benefits of exercise include fitness, good health, weight management, more energy, better sleep, less stress, and overall well-being.  Medical   Heart Disease Risk Reduction Clinical staff conducted group or individual video education with verbal and written material and guidebook.  Patient learns our heart is our most vital organ as it circulates oxygen, nutrients, white blood cells, and hormones throughout the entire body, and carries waste away. Data supports a plant-based eating plan like the Pritikin Program for its effectiveness in slowing progression of and reversing heart disease. The video provides a number of recommendations to address heart disease.   Metabolic Syndrome and Belly Fat  Clinical staff conducted group or individual video education with verbal and written material and guidebook.  Patient learns what metabolic syndrome is, how  it leads to heart disease, and how one can reverse it and keep it from coming back. You have metabolic syndrome if you have 3 of the following 5 criteria: abdominal obesity, high blood pressure, high triglycerides, low HDL cholesterol, and high blood sugar.  Hypertension and Heart Disease Clinical staff conducted group or individual video education with verbal and written material and guidebook.  Patient learns that high blood pressure, or hypertension, is very common in the [place]. Hypertension is largely due to excessive salt intake, but other important risk factors include being overweight, physical inactivity, drinking too much alcohol, smoking, and not eating enough potassium from fruits  and vegetables. High blood pressure is a leading risk factor for heart attack, stroke, congestive heart failure, dementia, kidney failure, and premature death. Long-term effects of excessive salt intake include stiffening of the arteries and thickening of heart muscle and organ damage. Recommendations include ways to reduce hypertension and the risk of heart disease.  Diseases of Our Time - Focusing on Diabetes Clinical staff conducted group or individual video education with verbal and written material and guidebook.  Patient learns why the best way to stop diseases of our time is prevention, through food and other lifestyle changes. Medicine (such as prescription pills and surgeries) is often only a Band-Aid on the problem, not a long-term solution. Most common diseases of our time include obesity, type 2 diabetes, hypertension, heart disease, and cancer. The Pritikin Program is recommended and has been proven to help reduce, reverse, and/or prevent the damaging effects of metabolic syndrome.  Nutrition   Overview of the Pritikin Eating Plan  Clinical staff conducted group or individual video education with verbal and written material and guidebook.  Patient learns about the [place] for disease risk reduction. The [place] emphasizes a wide variety of unrefined, minimally-processed carbohydrates, like fruits, vegetables, whole grains, and legumes. Go, Caution, and Stop food choices are explained. Plant-based and lean animal proteins are emphasized. Rationale provided for low sodium intake for blood pressure control, low added sugars for blood sugar stabilization, and low added fats and oils for coronary artery disease risk reduction and weight management.  Calorie Density  Clinical staff conducted group or individual video education with verbal and written material and guidebook.  Patient learns about calorie density and how it impacts the Pritikin Eating Plan. Knowing the  characteristics of the food you choose will help you decide whether those foods will lead to weight gain or weight loss, and whether you want to consume more or less of them. Weight loss is usually a side effect of the Pritikin Eating Plan because of its focus on low calorie-dense foods.  Label Reading  Clinical staff conducted group or individual video education with verbal and written material and guidebook.  Patient learns about the Pritikin recommended label reading guidelines and corresponding recommendations regarding calorie density, added sugars, sodium content, and whole grains.  Dining Out - Part 1  Clinical staff conducted group or individual video education with verbal and written material and guidebook.  Patient learns that restaurant meals can be sabotaging because they can be so high in calories, fat, sodium, and/or sugar. Patient learns recommended strategies on how to positively address this and avoid unhealthy pitfalls.  Facts on Fats  Clinical staff conducted group or individual video education with verbal and written material and guidebook.  Patient learns that lifestyle modifications can be just as effective, if not more so, as many medications for lowering your risk of  heart disease. A Pritikin lifestyle can help to reduce your risk of inflammation and atherosclerosis (cholesterol build-up, or plaque, in the artery walls). Lifestyle interventions such as dietary choices and physical activity address the cause of atherosclerosis. A review of the types of fats and their impact on blood cholesterol levels, along with dietary recommendations to reduce fat intake is also included.  Nutrition Action Plan  Clinical staff conducted group or individual video education with verbal and written material and guidebook.  Patient learns how to incorporate Pritikin recommendations into their lifestyle. Recommendations include planning and keeping personal health goals in mind as an important  part of their success.  Healthy Mind-Set    Healthy Minds, Bodies, Hearts  Clinical staff conducted group or individual video education with verbal and written material and guidebook.  Patient learns how to identify when they are stressed. Video will discuss the impact of that stress, as well as the many benefits of stress management. Patient will also be introduced to stress management techniques. The way we think, act, and feel has an impact on our hearts.  How Our Thoughts Can Heal Our Hearts  Clinical staff conducted group or individual video education with verbal and written material and guidebook.  Patient learns that negative thoughts can cause depression and anxiety. This can result in negative lifestyle behavior and serious health problems. Cognitive behavioral therapy is an effective method to help control our thoughts in order to change and improve our emotional outlook.  Additional Videos:  Exercise    Improving Performance  Clinical staff conducted group or individual video education with verbal and written material and guidebook.  Patient learns to use a non-linear approach by alternating intensity levels and lengths of time spent exercising to help burn more calories and lose more body fat. Cardiovascular exercise helps improve heart health, metabolism, hormonal balance, blood sugar control, and recovery from fatigue. Resistance training improves strength, endurance, balance, coordination, reaction time, metabolism, and muscle mass. Flexibility exercise improves circulation, posture, and balance. Seek guidance from your physician and exercise physiologist before implementing an exercise routine and learn your capabilities and proper form for all exercise.  Introduction to Yoga  Clinical staff conducted group or individual video education with verbal and written material and guidebook.  Patient learns about yoga, a discipline of the coming together of mind, breath, and body. The  benefits of yoga include improved flexibility, improved range of motion, better posture and core strength, increased lung function, weight loss, and positive self-image. Yoga's heart health benefits include lowered blood pressure, healthier heart rate, decreased cholesterol and triglyceride levels, improved immune function, and reduced stress. Seek guidance from your physician and exercise physiologist before implementing an exercise routine and learn your capabilities and proper form for all exercise.  Medical   Aging: Enhancing Your Quality of Life  Clinical staff conducted group or individual video education with verbal and written material and guidebook.  Patient learns key strategies and recommendations to stay in good physical health and enhance quality of life, such as prevention strategies, having an advocate, securing a [place], and keeping a list of medications and system for tracking them. It also discusses how to avoid risk for bone loss.  Biology of Weight Control  Clinical staff conducted group or individual video education with verbal and written material and guidebook.  Patient learns that weight gain occurs because we consume more calories than we burn (eating more, moving less). Even if your body weight is normal,  you may have higher ratios of fat compared to muscle mass. Too much body fat puts you at increased risk for cardiovascular disease, heart attack, stroke, type 2 diabetes, and obesity-related cancers. In addition to exercise, following the [place] can help reduce your risk.  Decoding Lab Results  Clinical staff conducted group or individual video education with verbal and written material and guidebook.  Patient learns that lab test reflects one measurement whose values change over time and are influenced by many factors, including medication, stress, sleep, exercise, food, hydration, pre-existing medical conditions, and more. It is  recommended to use the knowledge from this video to become more involved with your lab results and evaluate your numbers to speak with your doctor.   Diseases of Our Time - Overview  Clinical staff conducted group or individual video education with verbal and written material and guidebook.  Patient learns that according to the CDC, 50% to 70% of chronic diseases (such as obesity, type 2 diabetes, elevated lipids, hypertension, and heart disease) are avoidable through lifestyle improvements including healthier food choices, listening to satiety cues, and increased physical activity.  Sleep Disorders Clinical staff conducted group or individual video education with verbal and written material and guidebook.  Patient learns how good quality and duration of sleep are important to overall health and well-being. Patient also learns about sleep disorders and how they impact health along with recommendations to address them, including discussing with a physician.  Nutrition  Dining Out - Part 2 Clinical staff conducted group or individual video education with verbal and written material and guidebook.  Patient learns how to plan ahead and communicate in order to maximize their dining experience in a healthy and nutritious manner. Included are recommended food choices based on the type of restaurant the patient is visiting.   Fueling a Best boy conducted group or individual video education with verbal and written material and guidebook.  There is a strong connection between our food choices and our health. Diseases like obesity and type 2 diabetes are very prevalent and are in large-part due to lifestyle choices. The Pritikin Eating Plan provides plenty of food and hunger-curbing satisfaction. It is easy to follow, affordable, and helps reduce health risks.  Menu Workshop  Clinical staff conducted group or individual video education with verbal and written material and guidebook.   Patient learns that restaurant meals can sabotage health goals because they are often packed with calories, fat, sodium, and sugar. Recommendations include strategies to plan ahead and to communicate with the manager, chef, or server to help order a healthier meal.  Planning Your Eating Strategy  Clinical staff conducted group or individual video education with verbal and written material and guidebook.  Patient learns about the [place] and its benefit of reducing the risk of disease. The [place] does not focus on calories. Instead, it emphasizes high-quality, nutrient-rich foods. By knowing the characteristics of the foods, we choose, we can determine their calorie density and make informed decisions.  Targeting Your Nutrition Priorities  Clinical staff conducted group or individual video education with verbal and written material and guidebook.  Patient learns that lifestyle habits have a tremendous impact on disease risk and progression. This video provides eating and physical activity recommendations based on your personal health goals, such as reducing LDL cholesterol, losing weight, preventing or controlling type 2 diabetes, and reducing high blood pressure.  Vitamins and Minerals  Clinical staff conducted group or individual video education  with verbal and written material and guidebook.  Patient learns different ways to obtain key vitamins and minerals, including through a recommended healthy diet. It is important to discuss all supplements you take with your doctor.   Healthy Mind-Set    Smoking Cessation  Clinical staff conducted group or individual video education with verbal and written material and guidebook.  Patient learns that cigarette smoking and tobacco addiction pose a serious health risk which affects millions of people. Stopping smoking will significantly reduce the risk of heart disease, lung disease, and many forms of cancer. Recommended  strategies for quitting are covered, including working with your doctor to develop a successful plan.  Culinary   Becoming a Financial trader conducted group or individual video education with verbal and written material and guidebook.  Patient learns that cooking at home can be healthy, cost-effective, quick, and puts them in control. Keys to cooking healthy recipes will include looking at your recipe, assessing your equipment needs, planning ahead, making it simple, choosing cost-effective seasonal ingredients, and limiting the use of added fats, salts, and sugars.  Cooking - Breakfast and Snacks  Clinical staff conducted group or individual video education with verbal and written material and guidebook.  Patient learns how important breakfast is to satiety and nutrition through the entire day. Recommendations include key foods to eat during breakfast to help stabilize blood sugar levels and to prevent overeating at meals later in the day. Planning ahead is also a key component.  Cooking - Human resources officer conducted group or individual video education with verbal and written material and guidebook.  Patient learns eating strategies to improve overall health, including an approach to cook more at home. Recommendations include thinking of animal protein as a side on your plate rather than center stage and focusing instead on lower calorie dense options like vegetables, fruits, whole grains, and plant-based proteins, such as beans. Making sauces in large quantities to freeze for later and leaving the skin on your vegetables are also recommended to maximize your experience.  Cooking - Healthy Salads and Dressing Clinical staff conducted group or individual video education with verbal and written material and guidebook.  Patient learns that vegetables, fruits, whole grains, and legumes are the foundations of the [place]. Recommendations include how to  incorporate each of these in flavorful and healthy salads, and how to create homemade salad dressings. Proper handling of ingredients is also covered. Cooking - Soups and [employer] - Soups and Desserts Clinical staff conducted group or individual video education with verbal and written material and guidebook.  Patient learns that Pritikin soups and desserts make for easy, nutritious, and delicious snacks and meal components that are low in sodium, fat, sugar, and calorie density, while high in vitamins, minerals, and filling fiber. Recommendations include simple and healthy ideas for soups and desserts.   Overview     The Pritikin Solution Program Overview Clinical staff conducted group or individual video education with verbal and written material and guidebook.  Patient learns that the results of the [place] Program have been documented in more than 100 articles published in peer-reviewed journals, and the benefits include reducing risk factors for (and, in some cases, even reversing) high cholesterol, high blood pressure, type 2 diabetes, obesity, and more! An overview of the three key pillars of the Pritikin Program will be covered: eating well, doing regular exercise, and having a healthy mind-set.  WORKSHOPS  Exercise: Exercise Basics: Building Your  Action Plan Clinical staff led group instruction and group discussion with PowerPoint presentation and patient guidebook. To enhance the learning environment the use of posters, models and videos may be added. At the conclusion of this workshop, patients will comprehend the difference between physical activity and exercise, as well as the benefits of incorporating both, into their routine. Patients will understand the FITT (Frequency, Intensity, Time, and Type) principle and how to use it to build an exercise action plan. In addition, safety concerns and other considerations for exercise and cardiac rehab will be addressed by the  presenter. The purpose of this lesson is to promote a comprehensive and effective weekly exercise routine in order to improve patients' overall level of fitness.   Managing Heart Disease: Your Path to a Healthier Heart Clinical staff led group instruction and group discussion with PowerPoint presentation and patient guidebook. To enhance the learning environment the use of posters, models and videos may be added.At the conclusion of this workshop, patients will understand the anatomy and physiology of the heart. Additionally, they will understand how Pritikin's three pillars impact the risk factors, the progression, and the management of heart disease.  The purpose of this lesson is to provide a high-level overview of the heart, heart disease, and how the Pritikin lifestyle positively impacts risk factors.  Exercise Biomechanics Clinical staff led group instruction and group discussion with PowerPoint presentation and patient guidebook. To enhance the learning environment the use of posters, models and videos may be added. Patients will learn how the structural parts of their bodies function and how these functions impact their daily activities, movement, and exercise. Patients will learn how to promote a neutral spine, learn how to manage pain, and identify ways to improve their physical movement in order to promote healthy living. The purpose of this lesson is to expose patients to common physical limitations that impact physical activity. Participants will learn practical ways to adapt and manage aches and pains, and to minimize their effect on regular exercise. Patients will learn how to maintain good posture while sitting, walking, and lifting.  Balance Training and Fall Prevention  Clinical staff led group instruction and group discussion with PowerPoint presentation and patient guidebook. To enhance the learning environment the use of posters, models and videos may be added. At the  conclusion of this workshop, patients will understand the importance of their sensorimotor skills (vision, proprioception, and the vestibular system) in maintaining their ability to balance as they age. Patients will apply a variety of balancing exercises that are appropriate for their current level of function. Patients will understand the common causes for poor balance, possible solutions to these problems, and ways to modify their physical environment in order to minimize their fall risk. The purpose of this lesson is to teach patients about the importance of maintaining balance as they age and ways to minimize their risk of falling.  WORKSHOPS   Nutrition:  Fueling a Scientist, research (physical sciences) led group instruction and group discussion with PowerPoint presentation and patient guidebook. To enhance the learning environment the use of posters, models and videos may be added. Patients will review the foundational principles of the [place] and understand what constitutes a serving size in each of the food groups. Patients will also learn Pritikin-friendly foods that are better choices when away from home and review make-ahead meal and snack options. Calorie density will be reviewed and applied to three nutrition priorities: weight maintenance, weight loss, and weight gain. The purpose of this  lesson is to reinforce (in a group setting) the key concepts around what patients are recommended to eat and how to apply these guidelines when away from home by planning and selecting Pritikin-friendly options. Patients will understand how calorie density may be adjusted for different weight management goals.  Mindful Eating  Clinical staff led group instruction and group discussion with PowerPoint presentation and patient guidebook. To enhance the learning environment the use of posters, models and videos may be added. Patients will briefly review the concepts of the [place] and the  importance of low-calorie dense foods. The concept of mindful eating will be introduced as well as the importance of paying attention to internal hunger signals. Triggers for non-hunger eating and techniques for dealing with triggers will be explored. The purpose of this lesson is to provide patients with the opportunity to review the basic principles of the [place], discuss the value of eating mindfully and how to measure internal cues of hunger and fullness using the Hunger Scale. Patients will also discuss reasons for non-hunger eating and learn strategies to use for controlling emotional eating.  Targeting Your Nutrition Priorities Clinical staff led group instruction and group discussion with PowerPoint presentation and patient guidebook. To enhance the learning environment the use of posters, models and videos may be added. Patients will learn how to determine their genetic susceptibility to disease by reviewing their family history. Patients will gain insight into the importance of diet as part of an overall healthy lifestyle in mitigating the impact of genetics and other environmental insults. The purpose of this lesson is to provide patients with the opportunity to assess their personal nutrition priorities by looking at their family history, their own health history and current risk factors. Patients will also be able to discuss ways of prioritizing and modifying the [place] for their highest risk areas  Menu  Clinical staff led group instruction and group discussion with PowerPoint presentation and patient guidebook. To enhance the learning environment the use of posters, models and videos may be added. Using menus brought in from [employer], or printed from [employer], patients will apply the [place] dining out guidelines that were presented in the [employer] video. Patients will also be able to practice these guidelines in a variety of  provided scenarios. The purpose of this lesson is to provide patients with the opportunity to practice hands-on learning of the [place] with actual menus and practice scenarios.  Label Reading Clinical staff led group instruction and group discussion with PowerPoint presentation and patient guidebook. To enhance the learning environment the use of posters, models and videos may be added. Patients will review and discuss the Pritikin label reading guidelines presented in Pritikin's Label Reading Educational series video. Using fool labels brought in from local grocery stores and markets, patients will apply the label reading guidelines and determine if the packaged food meet the Pritikin guidelines. The purpose of this lesson is to provide patients with the opportunity to review, discuss, and practice hands-on learning of the Pritikin Label Reading guidelines with actual packaged food labels. [place] Workshops are designed to teach patients ways to prepare quick, simple, and affordable recipes at home. The importance of nutrition's role in chronic disease risk reduction is reflected in its emphasis in the overall Pritikin program. By learning how to prepare essential core Pritikin Eating Plan recipes, patients will increase control over what they eat; be able to customize  the flavor of foods without the use of added salt, sugar, or fat; and improve the quality of the food they consume. By learning a set of core recipes which are easily assembled, quickly prepared, and affordable, patients are more likely to prepare more healthy foods at home. These workshops focus on convenient breakfasts, simple entres, side dishes, and desserts which can be prepared with minimal effort and are consistent with nutrition recommendations for cardiovascular risk reduction. Cooking [employer] are taught by a Engineer, materials (RD) who has been trained by the  [employer]. The chef or RD has a clear understanding of the importance of minimizing - if not completely eliminating - added fat, sugar, and sodium in recipes. Throughout the series of [place] Workshop sessions, patients will learn about healthy ingredients and efficient methods of cooking to build confidence in their capability to prepare    Cooking School weekly topics:  Adding Flavor- Sodium-Free  Fast and Healthy Breakfasts  Powerhouse Plant-Based Proteins  Satisfying Salads and Dressings  Simple Sides and Sauces  International Cuisine-Spotlight on the [place] Zones  Delicious Desserts  Savory Soups  [employer] - Meals in a Agricultural consultant Appetizers and Snacks  Comforting Weekend Breakfasts  One-Pot Wonders   Fast Evening Meals  Contractor Your Pritikin Plate  WORKSHOPS   Healthy Mindset (Psychosocial):  Focused Goals, Sustainable Changes Clinical staff led group instruction and group discussion with PowerPoint presentation and patient guidebook. To enhance the learning environment the use of posters, models and videos may be added. Patients will be able to apply effective goal setting strategies to establish at least one personal goal, and then take consistent, meaningful action toward that goal. They will learn to identify common barriers to achieving personal goals and develop strategies to overcome them. Patients will also gain an understanding of how our mind-set can impact our ability to achieve goals and the importance of cultivating a positive and growth-oriented mind-set. The purpose of this lesson is to provide patients with a deeper understanding of how to set and achieve personal goals, as well as the tools and strategies needed to overcome common obstacles which may arise along the way.  From Head to Heart: The Power of a Healthy Outlook  Clinical staff led group instruction and group discussion with PowerPoint presentation  and patient guidebook. To enhance the learning environment the use of posters, models and videos may be added. Patients will be able to recognize and describe the impact of emotions and mood on physical health. They will discover the importance of self-care and explore self-care practices which may work for them. Patients will also learn how to utilize the 4 C's to cultivate a healthier outlook and better manage stress and challenges. The purpose of this lesson is to demonstrate to patients how a healthy outlook is an essential part of maintaining good health, especially as they continue their cardiac rehab journey.  Healthy Sleep for a Healthy Heart Clinical staff led group instruction and group discussion with PowerPoint presentation and patient guidebook. To enhance the learning environment the use of posters, models and videos may be added. At the conclusion of this workshop, patients will be able to demonstrate knowledge of the importance of sleep to overall health, well-being, and quality of life. They will understand the symptoms of, and treatments for, common sleep disorders. Patients will also be able to identify daytime and nighttime behaviors which impact sleep, and they will be able to apply  these tools to help manage sleep-related challenges. The purpose of this lesson is to provide patients with a general overview of sleep and outline the importance of quality sleep. Patients will learn about a few of the most common sleep disorders. Patients will also be introduced to the concept of "sleep hygiene," and discover ways to self-manage certain sleeping problems through simple daily behavior changes. Finally, the workshop will motivate patients by clarifying the links between quality sleep and their goals of heart-healthy living.   Recognizing and Reducing Stress Clinical staff led group instruction and group discussion with PowerPoint presentation and patient guidebook. To enhance the learning  environment the use of posters, models and videos may be added. At the conclusion of this workshop, patients will be able to understand the types of stress reactions, differentiate between acute and chronic stress, and recognize the impact that chronic stress has on their health. They will also be able to apply different coping mechanisms, such as reframing negative self-talk. Patients will have the opportunity to practice a variety of stress management techniques, such as deep abdominal breathing, progressive muscle relaxation, and/or guided imagery.  The purpose of this lesson is to educate patients on the role of stress in their lives and to provide healthy techniques for coping with it.  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 Doe not have a prosthesis   Learning Preferences Audio;Individual Instruction;Group Instruction;Video;Verbal Instruction;Skilled Demonstration;Pictoral;Written Material             Education Topics:  Knowledge Questionnaire Score:  Knowledge Questionnaire Score - 02/15/22 1420       Knowledge Questionnaire Score   Pre Score 18/24             Core Components/Risk Factors/Patient Goals at Admission:  Personal Goals and Risk Factors at Admission - 02/15/22 1357       Core Components/Risk Factors/Patient Goals on Admission    Weight Management Yes;Weight Maintenance    Intervention Weight Management: Develop a combined nutrition and exercise program designed to reach desired caloric intake, while maintaining appropriate intake of nutrient and fiber, sodium and fats, and appropriate energy expenditure required for the weight goal.;Weight Management: Provide education and appropriate resources to help participant work on and attain dietary goals.    Expected Outcomes Short Term: Continue to  assess and modify interventions until short term weight is achieved;Long Term: Adherence to nutrition and physical activity/exercise program aimed toward attainment of established weight goal;Understanding recommendations for meals to include 15-35% energy as protein, 25-35% energy from fat, 35-60% energy from carbohydrates, less than 200mg  of dietary cholesterol, 20-35 gm of total fiber daily    Diabetes Yes    Intervention Provide education about signs/symptoms and action to take for hypo/hyperglycemia.;Provide education about proper nutrition, including hydration, and aerobic/resistive exercise prescription along with prescribed medications to achieve blood glucose in normal ranges: Fasting glucose 65-99 mg/dL    Expected Outcomes Short Term: Participant verbalizes understanding of the signs/symptoms and immediate care of hyper/hypoglycemia, proper foot care and importance of medication, aerobic/resistive exercise and nutrition plan for blood glucose control.;Long Term: Attainment of HbA1C < 7%.    Hypertension Yes    Intervention Provide education on lifestyle modifcations including regular physical activity/exercise, weight management, moderate sodium restriction and increased consumption of fresh fruit, vegetables, and low fat dairy, alcohol moderation, and smoking  cessation.;Monitor prescription use compliance.    Expected Outcomes Short Term: Continued assessment and intervention until BP is < 140/44mm HG in hypertensive participants. < 130/38mm HG in hypertensive participants with diabetes, heart failure or chronic kidney disease.;Long Term: Maintenance of blood pressure at goal levels.    Lipids Yes    Intervention Provide education and support for participant on nutrition & aerobic/resistive exercise along with prescribed medications to achieve LDL 70mg , HDL >40mg .    Expected Outcomes Short Term: Participant states understanding of desired cholesterol values and is compliant with medications  prescribed. Participant is following exercise prescription and nutrition guidelines.;Long Term: Cholesterol controlled with medications as prescribed, with individualized exercise RX and with personalized nutrition plan. Value goals: LDL < 70mg , HDL > 40 mg.    Stress Yes    Intervention Offer individual and/or small group education and counseling on adjustment to heart disease, stress management and health-related lifestyle change. Teach and support self-help strategies.;Refer participants experiencing significant psychosocial distress to appropriate mental health specialists for further evaluation and treatment. When possible, include family members and significant others in education/counseling sessions.    Expected Outcomes Short Term: Participant demonstrates changes in health-related behavior, relaxation and other stress management skills, ability to obtain effective social support, and compliance with psychotropic medications if prescribed.;Long Term: Emotional wellbeing is indicated by absence of clinically significant psychosocial distress or social isolation.    Personal Goal Other Yes    Personal Goal Increase mobility to be able to return to golfing.    Intervention Provide aerobic and stretching exercise routine to help improve mobility and flexibilty to be able to return to golf once cleared by cardiology.    Expected Outcomes Patient will have to mobility to be able to resume golfing once cleared by cardiologist to do so.             Core Components/Risk Factors/Patient Goals Review:   Goals and Risk Factor Review     Row Name 02/28/22 0842 03/29/22 1643           Core Components/Risk Factors/Patient Goals Review   Personal Goals Review Weight Management/Obesity;Stress;Hypertension;Lipids;Diabetes Weight Management/Obesity;Stress;Hypertension;Lipids;Diabetes      Review [place] started intensive cardiac rehab on 02/21/22 and is off to a good start to exercise. Vital signs and  CBG's have been stable [place] is doing well with exercise at  intensive cardiac rehab  Vital signs and CBG's have been stable. Cashis reports feeling stronger and says he has more energy doing things around the house      Expected [place] will continue to participate in intensive cardiac rehab for exercise, nutrition and lifestyle modifications [place] will continue to participate in intensive cardiac rehab for exercise, nutrition and lifestyle modifications               Core Components/Risk Factors/Patient Goals at Discharge (Final Review):   Goals and Risk Factor Review - 03/29/22 1643       Core Components/Risk Factors/Patient Goals Review   Personal Goals Review Weight Management/Obesity;Stress;Hypertension;Lipids;Diabetes    Review [place] is doing well with exercise at  intensive cardiac rehab  Vital signs and CBG's have been stable. Jermari reports feeling stronger and says he has more energy doing things around the house    Expected [place] will continue to participate in intensive cardiac rehab for exercise, nutrition and lifestyle modifications             ITP Comments:  ITP Comments     Row Name 02/15/22 1014 02/22/22  1708 03/29/22 1638       ITP Comments Medical Director- Dr. Fransico Him, MD, Introduction to Pritikin Education Program/ Intensive Cardiac Rehab. Initial Orientation Packet reviewed with the Patient 30 Day ITP review. Levin started intensive cardiac rehab on 02/21/22 and did well with exercise 30 Day ITP review. [place] has good attendance and participation in intensive cardiac rehab              Comments: See ITP Comments

## 2022-03-30 NOTE — Progress Notes (Signed)
Reviewed home exercise guidelines with patient including endpoints, temperature precautions, target heart rate and rate of perceived exertion. Patient is currently walking 20 minutes daily and using his 10 lb hand weights 3 days/week as his mode of home exercise. Patient voices understanding of instructions given.  Sol Passer, MS, ACSM CEP

## 2022-04-01 ENCOUNTER — Encounter (HOSPITAL_COMMUNITY)
Admission: RE | Admit: 2022-04-01 | Discharge: 2022-04-01 | Disposition: A | Discharge status: Home / Self Care | Payer: Medicare Other | Source: Ambulatory Visit | Attending: Internal Medicine | Admitting: Internal Medicine

## 2022-04-01 DIAGNOSIS — Z951 Presence of aortocoronary bypass graft: Secondary | ICD-10-CM

## 2022-04-01 DIAGNOSIS — Z48812 Encounter for surgical aftercare following surgery on the circulatory system: Secondary | ICD-10-CM | POA: Diagnosis not present

## 2022-04-04 ENCOUNTER — Encounter (HOSPITAL_COMMUNITY)
Admission: RE | Admit: 2022-04-04 | Discharge: 2022-04-04 | Disposition: A | Discharge status: Home / Self Care | Payer: Medicare Other | Source: Ambulatory Visit | Attending: Internal Medicine | Admitting: Internal Medicine

## 2022-04-04 DIAGNOSIS — Z951 Presence of aortocoronary bypass graft: Secondary | ICD-10-CM

## 2022-04-04 DIAGNOSIS — Z48812 Encounter for surgical aftercare following surgery on the circulatory system: Secondary | ICD-10-CM | POA: Diagnosis not present

## 2022-04-06 ENCOUNTER — Encounter (HOSPITAL_COMMUNITY)
Admission: RE | Admit: 2022-04-06 | Discharge: 2022-04-06 | Disposition: A | Discharge status: Home / Self Care | Payer: Medicare Other | Source: Ambulatory Visit | Attending: Internal Medicine | Admitting: Internal Medicine

## 2022-04-06 DIAGNOSIS — Z951 Presence of aortocoronary bypass graft: Secondary | ICD-10-CM

## 2022-04-06 DIAGNOSIS — Z48812 Encounter for surgical aftercare following surgery on the circulatory system: Secondary | ICD-10-CM | POA: Diagnosis not present

## 2022-04-08 ENCOUNTER — Other Ambulatory Visit: Payer: Self-pay | Admitting: Surgical

## 2022-04-08 ENCOUNTER — Other Ambulatory Visit: Payer: Self-pay | Admitting: Family Medicine

## 2022-04-08 ENCOUNTER — Encounter (HOSPITAL_COMMUNITY): Payer: Medicare Other

## 2022-04-08 ENCOUNTER — Other Ambulatory Visit: Payer: Self-pay | Admitting: Neurology

## 2022-04-08 DIAGNOSIS — E119 Type 2 diabetes mellitus without complications: Secondary | ICD-10-CM

## 2022-04-11 ENCOUNTER — Encounter (HOSPITAL_COMMUNITY)
Admission: RE | Admit: 2022-04-11 | Discharge: 2022-04-11 | Disposition: A | Discharge status: Home / Self Care | Payer: Medicare Other | Source: Ambulatory Visit | Attending: Internal Medicine | Admitting: Internal Medicine

## 2022-04-11 DIAGNOSIS — Z48812 Encounter for surgical aftercare following surgery on the circulatory system: Secondary | ICD-10-CM | POA: Diagnosis not present

## 2022-04-11 DIAGNOSIS — Z951 Presence of aortocoronary bypass graft: Secondary | ICD-10-CM | POA: Insufficient documentation

## 2022-04-13 ENCOUNTER — Encounter (HOSPITAL_COMMUNITY)
Admission: RE | Admit: 2022-04-13 | Discharge: 2022-04-13 | Disposition: A | Discharge status: Home / Self Care | Payer: Medicare Other | Source: Ambulatory Visit | Attending: Internal Medicine | Admitting: Internal Medicine

## 2022-04-13 DIAGNOSIS — Z48812 Encounter for surgical aftercare following surgery on the circulatory system: Secondary | ICD-10-CM | POA: Diagnosis not present

## 2022-04-13 DIAGNOSIS — Z951 Presence of aortocoronary bypass graft: Secondary | ICD-10-CM

## 2022-04-15 ENCOUNTER — Encounter (HOSPITAL_COMMUNITY)
Admission: RE | Admit: 2022-04-15 | Discharge: 2022-04-15 | Disposition: A | Discharge status: Home / Self Care | Payer: Medicare Other | Source: Ambulatory Visit | Attending: Internal Medicine | Admitting: Internal Medicine

## 2022-04-15 DIAGNOSIS — Z951 Presence of aortocoronary bypass graft: Secondary | ICD-10-CM | POA: Diagnosis not present

## 2022-04-15 DIAGNOSIS — Z48812 Encounter for surgical aftercare following surgery on the circulatory system: Secondary | ICD-10-CM | POA: Diagnosis not present

## 2022-04-18 ENCOUNTER — Encounter (HOSPITAL_COMMUNITY)
Admission: RE | Admit: 2022-04-18 | Discharge: 2022-04-18 | Disposition: A | Discharge status: Home / Self Care | Payer: Medicare Other | Source: Ambulatory Visit | Attending: Internal Medicine | Admitting: Internal Medicine

## 2022-04-18 DIAGNOSIS — Z951 Presence of aortocoronary bypass graft: Secondary | ICD-10-CM

## 2022-04-18 DIAGNOSIS — Z48812 Encounter for surgical aftercare following surgery on the circulatory system: Secondary | ICD-10-CM | POA: Diagnosis not present

## 2022-04-20 ENCOUNTER — Encounter (HOSPITAL_COMMUNITY)
Admission: RE | Admit: 2022-04-20 | Discharge: 2022-04-20 | Disposition: A | Discharge status: Home / Self Care | Payer: Medicare Other | Source: Ambulatory Visit | Attending: Internal Medicine | Admitting: Internal Medicine

## 2022-04-20 DIAGNOSIS — Z48812 Encounter for surgical aftercare following surgery on the circulatory system: Secondary | ICD-10-CM | POA: Diagnosis not present

## 2022-04-20 DIAGNOSIS — Z951 Presence of aortocoronary bypass graft: Secondary | ICD-10-CM

## 2022-04-22 ENCOUNTER — Encounter (HOSPITAL_COMMUNITY)
Admission: RE | Admit: 2022-04-22 | Discharge: 2022-04-22 | Disposition: A | Discharge status: Home / Self Care | Payer: Medicare Other | Source: Ambulatory Visit | Attending: Internal Medicine | Admitting: Internal Medicine

## 2022-04-22 DIAGNOSIS — Z48812 Encounter for surgical aftercare following surgery on the circulatory system: Secondary | ICD-10-CM | POA: Diagnosis not present

## 2022-04-22 DIAGNOSIS — Z951 Presence of aortocoronary bypass graft: Secondary | ICD-10-CM | POA: Diagnosis not present

## 2022-04-25 ENCOUNTER — Encounter (HOSPITAL_COMMUNITY)
Admission: RE | Admit: 2022-04-25 | Discharge: 2022-04-25 | Disposition: A | Discharge status: Home / Self Care | Payer: Medicare Other | Source: Ambulatory Visit | Attending: Internal Medicine | Admitting: Internal Medicine

## 2022-04-25 DIAGNOSIS — Z48812 Encounter for surgical aftercare following surgery on the circulatory system: Secondary | ICD-10-CM | POA: Diagnosis not present

## 2022-04-25 DIAGNOSIS — Z951 Presence of aortocoronary bypass graft: Secondary | ICD-10-CM

## 2022-04-25 NOTE — Progress Notes (Signed)
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 Depression concerns found by scoring the questionnaire.;Long Term goal: The participant improves quality of Life and PHQ9 Scores as seen by post scores and/or verbalization of changes             Quality of Life Scores:  Quality of Life - 02/15/22 1419       Quality of Life   Select Quality of Life      Quality of Life Scores   Health/Function Pre 15.2 %    Socioeconomic Pre 25.2 %    Psych/Spiritual Pre 18.86 %    Family Pre 21.6 %    GLOBAL Pre 18.56 %            Scores of 19 and below usually indicate a poorer quality of life in  these areas.  A difference of  2-3 points is a clinically meaningful difference.  A difference of 2-3 points in the total score of the Quality of Life Index has been associated with significant improvement in overall quality of life, self-image, physical symptoms, and general health in studies assessing change in quality of life.  PHQ-9: Review Flowsheet  More data exists      02/15/2022 02/07/2022 12/21/2021 12/15/2021 12/15/2020  Depression screen PHQ 2/9  Decreased Interest 0 0 0 0 0  Down, Depressed, Hopeless 1 0 0 0 1  PHQ - 2 Score 1 0 0 0 1  Altered sleeping 1 - - - -  Tired, decreased energy 1 - - - -  Change in appetite 3 - - - -  Feeling bad or failure about yourself  1 - - - -  Trouble concentrating 0 - - - -  Moving slowly or fidgety/restless 0 - - - -  Suicidal thoughts 0 - - - -  PHQ-9 Score 7 - - - -  Difficult doing work/chores Not difficult at all - - - -   Interpretation of Total Score  Total Score Depression Severity:  1-4 = Minimal depression, 5-9 = Mild depression, 10-14 = Moderate depression, 15-19 = Moderately severe depression, 20-27 = Severe depression   Psychosocial Evaluation and Intervention:   Psychosocial Re-Evaluation:  Psychosocial Re-Evaluation     Row Name 02/22/22 1710 03/29/22 1640 04/21/22 1415         Psychosocial Re-Evaluation   Current issues with Current Stress Concerns Current Stress Concerns Current Stress Concerns     Comments Johnryan did not voice any concerns or stressors on his first day of exercise. Will review quality of life in the upcoming week Quality of life survey reviewed. Howar's wife is recovering post shoulder surgery. Kona has not mentioned any concerns or stressors during exercise at intensive cardiac rehab . Dinesh has not mentioned any concerns or stressors during exercise at intensive cardiac rehab.  Seann will complete intensive cardiac rehab on 05/06/22     Expected Outcomes -- Trevyon will have decreased concerns or  stressors upon completion of intensive cardiac rehab. Daylyn will have decreased concerns or stressors upon completion of intensive cardiac rehab.     Interventions Stress management education Stress management education Stress management education     Continue Psychosocial Services  Follow up required by staff No Follow up required No Follow up required       Initial Review   Source of Stress Concerns Chronic Illness Chronic Illness Chronic Illness     Comments will continue to monitor and offer suport as needed will continue to monitor and offer suport as needed will continue to monitor and offer suport as needed              Psychosocial Discharge (Final Psychosocial Re-Evaluation):  Psychosocial Re-Evaluation - 04/21/22 1415       Psychosocial Re-Evaluation   Current issues with Current Stress Concerns    Comments . Avry has not mentioned any concerns or stressors during exercise at intensive cardiac rehab.  Dawid will complete intensive cardiac rehab on 05/06/22    Expected Outcomes Francesco will have decreased concerns or stressors upon completion of intensive cardiac rehab.    Interventions Stress management education    Continue Psychosocial Services  No Follow up required      Initial Review   Source of Stress Concerns Chronic Illness    Comments will continue to monitor and offer suport as needed  Vocational Rehabilitation: Provide vocational rehab assistance to qualifying candidates.   Vocational Rehab Evaluation & Intervention:  Vocational Rehab - 02/15/22 1354       Initial Vocational Rehab Evaluation & Intervention   Assessment shows need for Vocational Rehabilitation No   Heitor is retired and does not need vocational rehab at this time            Education: Education Goals: Education classes will be provided on a weekly basis, covering required topics. Participant will state understanding/return demonstration of topics presented.     Education     Row Name 02/21/22 1200     Education   Cardiac Education Topics Pritikin   Geographical information systems officer Psychosocial   Psychosocial Workshop Healthy Sleep for a Healthy Heart   Instruction Review Code 1- Verbalizes Understanding   Class Start Time 1155   Class Stop Time 1238   Class Time Calculation (min) 43 min    Row Name 02/23/22 1600     Education   Cardiac Education Topics Pritikin   Customer service manager   Weekly Topic Comforting Weekend Breakfasts   Instruction Review Code 1- Verbalizes Understanding   Class Start Time 1155   Class Stop Time 1232   Class Time Calculation (min) 37 min    Row Name 02/25/22 1200     Education   Cardiac Education Topics Pritikin   Licensed conveyancer Nutrition   Nutrition Dining Out - Part 1   Instruction Review Code 1- Verbalizes Understanding   Class Start Time 1155   Class Stop Time 1230   Class Time Calculation (min) 35 min    Row Name 02/28/22 1300     Education   Cardiac Education Topics Pritikin   Psychologist, forensic Exercise Education   Exercise Education Biomechanial Limitations   Instruction Review Code 1- Verbalizes Understanding   Class Start Time 1150   Class Stop Time 1231   Class Time Calculation (min) 41 min    Row Name 03/02/22 1215     Education   Cardiac Education Topics Pritikin   Customer service manager   Weekly Topic Fast Evening Meals   Instruction Review Code 1- Verbalizes Understanding   Class Start Time 1140   Class Stop Time 1215   Class Time Calculation (min) 35 min    Row Name 03/04/22 1200     Education   Cardiac Education Topics Pritikin   Glass blower/designer Nutrition   Nutrition Workshop  Fueling a Forensic psychologist   Instruction Review Code 1- Tax inspector   Class Start Time 1147   Class Stop Time 1233   Class Time Calculation (min) 46 min    Row Name 03/07/22 1300     Education   Cardiac Education Topics Pritikin   Customer service manager   Weekly Topic International Cuisine- Spotlight on the [employer] Zones   Instruction Review Code 1- Verbalizes Understanding   Class Start Time 1150   Class Stop Time 1230   Class Time Calculation (min) 40 min  Row Name 03/09/22 1300     Education   Cardiac Education Topics Pritikin   Select Core Videos     Core Videos   Educator Dietitian   Select Nutrition   Nutrition Facts on Fat   Instruction Review Code 1- Verbalizes Understanding   Class Start Time 1145   Class Stop Time 1230   Class Time Calculation (min) 45 min    Row Name 03/14/22 1600     Education   Cardiac Education Topics Pritikin   Select Workshops     Workshops   Educator Exercise Physiologist   Select Psychosocial   Psychosocial Workshop Other  From [employer] to Heart   Instruction Review Code 1- Verbalizes Understanding   Class Start Time 1145   Class Stop Time 1231   Class Time Calculation (min) 46 min    Row Name 03/16/22 1600     Education   Cardiac Education Topics Pritikin   Customer service manager   Weekly Topic Adding Flavor - Sodium-Free   Instruction Review Code 1- Verbalizes Understanding   Class Start Time 1150   Class Stop Time 1223   Class Time Calculation (min) 33 min    Row Name 03/18/22 1200     Education   Cardiac Education Topics Pritikin   Hospital doctor Education   General Education Heart Disease Risk Reduction   Instruction Review Code 1- Verbalizes Understanding   Class Start Time 1147   Class Stop Time 1222   Class Time Calculation (min) 35 min    Row  Name 03/21/22 1400     Education   Cardiac Education Topics Pritikin   Select Workshops     Workshops   Educator Exercise Physiologist   Select Exercise   Exercise Workshop Location manager and Fall Prevention   Instruction Review Code 1- Verbalizes Understanding   Class Start Time 1145   Class Stop Time 1230   Class Time Calculation (min) 45 min    Row Name 03/25/22 1200     Education   Cardiac Education Topics Pritikin   Licensed conveyancer Nutrition   Nutrition Overview of the Pritikin Eating Plan   Instruction Review Code 1- Verbalizes Understanding   Class Start Time 1150   Class Stop Time 1230   Class Time Calculation (min) 40 min    Row Name 03/28/22 1700     Education   Cardiac Education Topics Pritikin   [employer]     Workshops   Educator Exercise Physiologist   Select Psychosocial   Psychosocial Workshop Recognizing and Reducing Stress   Instruction Review Code 1- Verbalizes Understanding   Class Start Time 1148   Class Stop Time 1233   Class Time Calculation (min) 45 min    Row Name 03/30/22 1200     Education   Cardiac Education Topics Pritikin   Orthoptist   Educator Dietitian   Weekly Topic Personalizing Your Pritikin Plate   Instruction Review Code 1- Verbalizes Understanding   Class Start Time 1140   Class Stop Time 1220   Class Time Calculation (min) 40 min    Row Name 04/01/22 1300     Education   Cardiac Education Topics Pritikin   Psychologist, sport and exercise  Core Videos   Educator Exercise Physiologist   Select Psychosocial   Psychosocial Healthy Minds, Bodies, Hearts   Instruction Review Code 1- Verbalizes Understanding   Class Start Time 1151   Class Stop Time 1230   Class Time Calculation (min) 39 min    Row Name 04/04/22 1300     Education   Cardiac Education Topics Pritikin   Glass blower/designer  Nutrition   Nutrition Workshop Label Reading   Instruction Review Code 1- Verbalizes Understanding   Class Start Time 1150   Class Stop Time 1233   Class Time Calculation (min) 43 min    Row Name 04/06/22 1230     Education   Cardiac Education Topics Pritikin   Customer service manager   Weekly Topic Tasty Appetizers and Snacks   Instruction Review Code 1- Verbalizes Understanding   Class Start Time 1150   Class Stop Time 1226   Class Time Calculation (min) 36 min    Row Name 04/13/22 1500     Education   Cardiac Education Topics Pritikin   Secondary school teacher School   Educator Dietitian   Weekly Topic Tasty Appetizers and Snacks   Instruction Review Code 1- Verbalizes Understanding   Class Start Time 1145   Class Stop Time 1223   Class Time Calculation (min) 38 min    Row Name 04/15/22 1300     Education   Cardiac Education Topics Pritikin   Licensed conveyancer Nutrition   Nutrition Calorie Density   Instruction Review Code 1- Verbalizes Understanding   Class Start Time 1145   Class Stop Time 1225   Class Time Calculation (min) 40 min    Row Name 04/18/22 1400     Education   Cardiac Education Topics Pritikin   Nurse, children's   Educator Dietitian   Select Nutrition   Nutrition Nutrition Action Plan   Instruction Review Code 1- Verbalizes Understanding   Class Start Time 1145   Class Stop Time 1219   Class Time Calculation (min) 34 min    Row Name 04/20/22 1300     Education   Cardiac Education Topics Pritikin   Customer service manager   Weekly Topic Efficiency Cooking - Meals in a Snap   Instruction Review Code 1- Verbalizes Understanding   Class Start Time 1140   Class Stop Time 1220   Class Time Calculation (min) 40 min    Row Name 04/22/22 1400     Education   Cardiac Education  Topics Pritikin   Psychologist, forensic Exercise Education   Exercise Education Move It!   Instruction Review Code 1- Verbalizes Understanding   Class Start Time 1150   Class Stop Time 1230   Class Time Calculation (min) 40 min    Row Name 04/25/22 1600     Education   Cardiac Education Topics Pritikin   Geographical information systems officer Psychosocial   Psychosocial Workshop Recognizing and Reducing Stress   Instruction Review Code 1- Verbalizes Understanding   Class Start Time 1150  Class Stop Time 1225   Class Time Calculation (min) 35 min            Core Videos: Exercise    Move It!  Clinical staff conducted group or individual video education with verbal and written material and guidebook.  Patient learns the recommended Pritikin exercise program. Exercise with the goal of living a long, healthy life. Some of the health benefits of exercise include controlled diabetes, healthier blood pressure levels, improved cholesterol levels, improved heart and lung capacity, improved sleep, and better body composition. Everyone should speak with their doctor before starting or changing an exercise routine.  Biomechanical Limitations Clinical staff conducted group or individual video education with verbal and written material and guidebook.  Patient learns how biomechanical limitations can impact exercise and how we can mitigate and possibly overcome limitations to have an impactful and balanced exercise routine.  Body Composition Clinical staff conducted group or individual video education with verbal and written material and guidebook.  Patient learns that body composition (ratio of muscle mass to fat mass) is a key component to assessing overall fitness, rather than body weight alone. Increased fat mass, especially visceral belly fat, can put [place] at increased risk for metabolic  syndrome, type 2 diabetes, heart disease, and even death. It is recommended to combine diet and exercise (cardiovascular and resistance training) to improve your body composition. Seek guidance from your physician and exercise physiologist before implementing an exercise routine.  Exercise Action Plan Clinical staff conducted group or individual video education with verbal and written material and guidebook.  Patient learns the recommended strategies to achieve and enjoy long-term exercise adherence, including variety, self-motivation, self-efficacy, and positive decision making. Benefits of exercise include fitness, good health, weight management, more energy, better sleep, less stress, and overall well-being.  Medical   Heart Disease Risk Reduction Clinical staff conducted group or individual video education with verbal and written material and guidebook.  Patient learns our heart is our most vital organ as it circulates oxygen, nutrients, white blood cells, and hormones throughout the entire body, and carries waste away. Data supports a plant-based eating plan like the Pritikin Program for its effectiveness in slowing progression of and reversing heart disease. The video provides a number of recommendations to address heart disease.   Metabolic Syndrome and Belly Fat  Clinical staff conducted group or individual video education with verbal and written material and guidebook.  Patient learns what metabolic syndrome is, how it leads to heart disease, and how one can reverse it and keep it from coming back. You have metabolic syndrome if you have 3 of the following 5 criteria: abdominal obesity, high blood pressure, high triglycerides, low HDL cholesterol, and high blood sugar.  Hypertension and Heart Disease Clinical staff conducted group or individual video education with verbal and written material and guidebook.  Patient learns that high blood pressure, or hypertension, is very common in the  [place]. Hypertension is largely due to excessive salt intake, but other important risk factors include being overweight, physical inactivity, drinking too much alcohol, smoking, and not eating enough potassium from fruits and vegetables. High blood pressure is a leading risk factor for heart attack, stroke, congestive heart failure, dementia, kidney failure, and premature death. Long-term effects of excessive salt intake include stiffening of the arteries and thickening of heart muscle and organ damage. Recommendations include ways to reduce hypertension and the risk of heart disease.  Diseases of Our Time - Focusing on Diabetes Clinical staff conducted group or  individual video education with verbal and written material and guidebook.  Patient learns why the best way to stop diseases of our time is prevention, through food and other lifestyle changes. Medicine (such as prescription pills and surgeries) is often only a Band-Aid on the problem, not a long-term solution. Most common diseases of our time include obesity, type 2 diabetes, hypertension, heart disease, and cancer. The Pritikin Program is recommended and has been proven to help reduce, reverse, and/or prevent the damaging effects of metabolic syndrome.  Nutrition   Overview of the Pritikin Eating Plan  Clinical staff conducted group or individual video education with verbal and written material and guidebook.  Patient learns about the Pritikin Eating Plan for disease risk reduction. The Pritikin Eating Plan emphasizes a wide variety of unrefined, minimally-processed carbohydrates, like fruits, vegetables, whole grains, and legumes. Go, Caution, and Stop food choices are explained. Plant-based and lean animal proteins are emphasized. Rationale provided for low sodium intake for blood pressure control, low added sugars for blood sugar stabilization, and low added fats and oils for coronary artery disease risk reduction and weight  management.  Calorie Density  Clinical staff conducted group or individual video education with verbal and written material and guidebook.  Patient learns about calorie density and how it impacts the Pritikin Eating Plan. Knowing the characteristics of the food you choose will help you decide whether those foods will lead to weight gain or weight loss, and whether you want to consume more or less of them. Weight loss is usually a side effect of the Pritikin Eating Plan because of its focus on low calorie-dense foods.  Label Reading  Clinical staff conducted group or individual video education with verbal and written material and guidebook.  Patient learns about the Pritikin recommended label reading guidelines and corresponding recommendations regarding calorie density, added sugars, sodium content, and whole grains.  Dining Out - Part 1  Clinical staff conducted group or individual video education with verbal and written material and guidebook.  Patient learns that restaurant meals can be sabotaging because they can be so high in calories, fat, sodium, and/or sugar. Patient learns recommended strategies on how to positively address this and avoid unhealthy pitfalls.  Facts on Fats  Clinical staff conducted group or individual video education with verbal and written material and guidebook.  Patient learns that lifestyle modifications can be just as effective, if not more so, as many medications for lowering your risk of heart disease. A Pritikin lifestyle can help to reduce your risk of inflammation and atherosclerosis (cholesterol build-up, or plaque, in the artery walls). Lifestyle interventions such as dietary choices and physical activity address the cause of atherosclerosis. A review of the types of fats and their impact on blood cholesterol levels, along with dietary recommendations to reduce fat intake is also included.  Nutrition Action Plan  Clinical staff conducted group or individual  video education with verbal and written material and guidebook.  Patient learns how to incorporate Pritikin recommendations into their lifestyle. Recommendations include planning and keeping personal health goals in mind as an important part of their success.  Healthy Mind-Set    Healthy Minds, Bodies, Hearts  Clinical staff conducted group or individual video education with verbal and written material and guidebook.  Patient learns how to identify when they are stressed. Video will discuss the impact of that stress, as well as the many benefits of stress management. Patient will also be introduced to stress management techniques. The way we think, act, and  feel has an impact on our hearts.  How Our Thoughts Can Heal Our Hearts  Clinical staff conducted group or individual video education with verbal and written material and guidebook.  Patient learns that negative thoughts can cause depression and anxiety. This can result in negative lifestyle behavior and serious health problems. Cognitive behavioral therapy is an effective method to help control our thoughts in order to change and improve our emotional outlook.  Additional Videos:  Exercise    Improving Performance  Clinical staff conducted group or individual video education with verbal and written material and guidebook.  Patient learns to use a non-linear approach by alternating intensity levels and lengths of time spent exercising to help burn more calories and lose more body fat. Cardiovascular exercise helps improve heart health, metabolism, hormonal balance, blood sugar control, and recovery from fatigue. Resistance training improves strength, endurance, balance, coordination, reaction time, metabolism, and muscle mass. Flexibility exercise improves circulation, posture, and balance. Seek guidance from your physician and exercise physiologist before implementing an exercise routine and learn your capabilities and proper form for all  exercise.  Introduction to Yoga  Clinical staff conducted group or individual video education with verbal and written material and guidebook.  Patient learns about yoga, a discipline of the coming together of mind, breath, and body. The benefits of yoga include improved flexibility, improved range of motion, better posture and core strength, increased lung function, weight loss, and positive self-image. Yoga's heart health benefits include lowered blood pressure, healthier heart rate, decreased cholesterol and triglyceride levels, improved immune function, and reduced stress. Seek guidance from your physician and exercise physiologist before implementing an exercise routine and learn your capabilities and proper form for all exercise.  Medical   Aging: Enhancing Your Quality of Life  Clinical staff conducted group or individual video education with verbal and written material and guidebook.  Patient learns key strategies and recommendations to stay in good physical health and enhance quality of life, such as prevention strategies, having an advocate, securing a Health Care Proxy and Power of Attorney, and keeping a list of medications and system for tracking them. It also discusses how to avoid risk for bone loss.  Biology of Weight Control  Clinical staff conducted group or individual video education with verbal and written material and guidebook.  Patient learns that weight gain occurs because we consume more calories than we burn (eating more, moving less). Even if your body weight is normal, you may have higher ratios of fat compared to muscle mass. Too much body fat puts you at increased risk for cardiovascular disease, heart attack, stroke, type 2 diabetes, and obesity-related cancers. In addition to exercise, following the Pritikin Eating Plan can help reduce your risk.  Decoding Lab Results  Clinical staff conducted group or individual video education with verbal and written material and  guidebook.  Patient learns that lab test reflects one measurement whose values change over time and are influenced by many factors, including medication, stress, sleep, exercise, food, hydration, pre-existing medical conditions, and more. It is recommended to use the knowledge from this video to become more involved with your lab results and evaluate your numbers to speak with your doctor.   Diseases of Our Time - Overview  Clinical staff conducted group or individual video education with verbal and written material and guidebook.  Patient learns that according to the CDC, 50% to 70% of chronic diseases (such as obesity, type 2 diabetes, elevated lipids, hypertension, and heart disease) are avoidable through  lifestyle improvements including healthier food choices, listening to satiety cues, and increased physical activity.  Sleep Disorders Clinical staff conducted group or individual video education with verbal and written material and guidebook.  Patient learns how good quality and duration of sleep are important to overall health and well-being. Patient also learns about sleep disorders and how they impact health along with recommendations to address them, including discussing with a physician.  Nutrition  Dining Out - Part 2 Clinical staff conducted group or individual video education with verbal and written material and guidebook.  Patient learns how to plan ahead and communicate in order to maximize their dining experience in a healthy and nutritious manner. Included are recommended food choices based on the type of restaurant the patient is visiting.   Fueling a Banker conducted group or individual video education with verbal and written material and guidebook.  There is a strong connection between our food choices and our health. Diseases like obesity and type 2 diabetes are very prevalent and are in large-part due to lifestyle choices. The Pritikin Eating Plan  provides plenty of food and hunger-curbing satisfaction. It is easy to follow, affordable, and helps reduce health risks.  Menu Workshop  Clinical staff conducted group or individual video education with verbal and written material and guidebook.  Patient learns that restaurant meals can sabotage health goals because they are often packed with calories, fat, sodium, and sugar. Recommendations include strategies to plan ahead and to communicate with the manager, chef, or server to help order a healthier meal.  Planning Your Eating Strategy  Clinical staff conducted group or individual video education with verbal and written material and guidebook.  Patient learns about the Pritikin Eating Plan and its benefit of reducing the risk of disease. The Pritikin Eating Plan does not focus on calories. Instead, it emphasizes high-quality, nutrient-rich foods. By knowing the characteristics of the foods, we choose, we can determine their calorie density and make informed decisions.  Targeting Your Nutrition Priorities  Clinical staff conducted group or individual video education with verbal and written material and guidebook.  Patient learns that lifestyle habits have a tremendous impact on disease risk and progression. This video provides eating and physical activity recommendations based on your personal health goals, such as reducing LDL cholesterol, losing weight, preventing or controlling type 2 diabetes, and reducing high blood pressure.  Vitamins and Minerals  Clinical staff conducted group or individual video education with verbal and written material and guidebook.  Patient learns different ways to obtain key vitamins and minerals, including through a recommended healthy diet. It is important to discuss all supplements you take with your doctor.   Healthy Mind-Set    Smoking Cessation  Clinical staff conducted group or individual video education with verbal and written material and guidebook.   Patient learns that cigarette smoking and tobacco addiction pose a serious health risk which affects millions of people. Stopping smoking will significantly reduce the risk of heart disease, lung disease, and many forms of cancer. Recommended strategies for quitting are covered, including working with your doctor to develop a successful plan.  Culinary   Becoming a Set designer conducted group or individual video education with verbal and written material and guidebook.  Patient learns that cooking at home can be healthy, cost-effective, quick, and puts them in control. Keys to cooking healthy recipes will include looking at your recipe, assessing your equipment needs, planning ahead, making it simple, choosing cost-effective seasonal ingredients,  and limiting the use of added fats, salts, and sugars.  Cooking - Breakfast and Snacks  Clinical staff conducted group or individual video education with verbal and written material and guidebook.  Patient learns how important breakfast is to satiety and nutrition through the entire day. Recommendations include key foods to eat during breakfast to help stabilize blood sugar levels and to prevent overeating at meals later in the day. Planning ahead is also a key component.  Cooking - Educational psychologist conducted group or individual video education with verbal and written material and guidebook.  Patient learns eating strategies to improve overall health, including an approach to cook more at home. Recommendations include thinking of animal protein as a side on your plate rather than center stage and focusing instead on lower calorie dense options like vegetables, fruits, whole grains, and plant-based proteins, such as beans. Making sauces in large quantities to freeze for later and leaving the skin on your vegetables are also recommended to maximize your experience.  Cooking - Healthy Salads and Dressing Clinical staff  conducted group or individual video education with verbal and written material and guidebook.  Patient learns that vegetables, fruits, whole grains, and legumes are the foundations of the Pritikin Eating Plan. Recommendations include how to incorporate each of these in flavorful and healthy salads, and how to create homemade salad dressings. Proper handling of ingredients is also covered. Cooking - Soups and [employer] - Soups and Desserts Clinical staff conducted group or individual video education with verbal and written material and guidebook.  Patient learns that Pritikin soups and desserts make for easy, nutritious, and delicious snacks and meal components that are low in sodium, fat, sugar, and calorie density, while high in vitamins, minerals, and filling fiber. Recommendations include simple and healthy ideas for soups and desserts.   Overview     The Pritikin Solution Program Overview Clinical staff conducted group or individual video education with verbal and written material and guidebook.  Patient learns that the results of the Pritikin Program have been documented in more than 100 articles published in peer-reviewed journals, and the benefits include reducing risk factors for (and, in some cases, even reversing) high cholesterol, high blood pressure, type 2 diabetes, obesity, and more! An overview of the three key pillars of the Pritikin Program will be covered: eating well, doing regular exercise, and having a healthy mind-set.  WORKSHOPS  Exercise: Exercise Basics: Building Your Action Plan Clinical staff led group instruction and group discussion with PowerPoint presentation and patient guidebook. To enhance the learning environment the use of posters, models and videos may be added. At the conclusion of this workshop, patients will comprehend the difference between physical activity and exercise, as well as the benefits of incorporating both, into their routine. Patients will  understand the FITT (Frequency, Intensity, Time, and Type) principle and how to use it to build an exercise action plan. In addition, safety concerns and other considerations for exercise and cardiac rehab will be addressed by the presenter. The purpose of this lesson is to promote a comprehensive and effective weekly exercise routine in order to improve patients' overall level of fitness.   Managing Heart Disease: Your Path to a Healthier Heart Clinical staff led group instruction and group discussion with PowerPoint presentation and patient guidebook. To enhance the learning environment the use of posters, models and videos may be added.At the conclusion of this workshop, patients will understand the anatomy and physiology of the heart. Additionally,  they will understand how Pritikin's three pillars impact the risk factors, the progression, and the management of heart disease.  The purpose of this lesson is to provide a high-level overview of the heart, heart disease, and how the Pritikin lifestyle positively impacts risk factors.  Exercise Biomechanics Clinical staff led group instruction and group discussion with PowerPoint presentation and patient guidebook. To enhance the learning environment the use of posters, models and videos may be added. Patients will learn how the structural parts of their bodies function and how these functions impact their daily activities, movement, and exercise. Patients will learn how to promote a neutral spine, learn how to manage pain, and identify ways to improve their physical movement in order to promote healthy living. The purpose of this lesson is to expose patients to common physical limitations that impact physical activity. Participants will learn practical ways to adapt and manage aches and pains, and to minimize their effect on regular exercise. Patients will learn how to maintain good posture while sitting, walking, and lifting.  Balance Training  and Fall Prevention  Clinical staff led group instruction and group discussion with PowerPoint presentation and patient guidebook. To enhance the learning environment the use of posters, models and videos may be added. At the conclusion of this workshop, patients will understand the importance of their sensorimotor skills (vision, proprioception, and the vestibular system) in maintaining their ability to balance as they age. Patients will apply a variety of balancing exercises that are appropriate for their current level of function. Patients will understand the common causes for poor balance, possible solutions to these problems, and ways to modify their physical environment in order to minimize their fall risk. The purpose of this lesson is to teach patients about the importance of maintaining balance as they age and ways to minimize their risk of falling.  WORKSHOPS   Nutrition:  Fueling a Ship broker led group instruction and group discussion with PowerPoint presentation and patient guidebook. To enhance the learning environment the use of posters, models and videos may be added. Patients will review the foundational principles of the Pritikin Eating Plan and understand what constitutes a serving size in each of the food groups. Patients will also learn Pritikin-friendly foods that are better choices when away from home and review make-ahead meal and snack options. Calorie density will be reviewed and applied to three nutrition priorities: weight maintenance, weight loss, and weight gain. The purpose of this lesson is to reinforce (in a group setting) the key concepts around what patients are recommended to eat and how to apply these guidelines when away from home by planning and selecting Pritikin-friendly options. Patients will understand how calorie density may be adjusted for different weight management goals.  Mindful Eating  Clinical staff led group instruction and group  discussion with PowerPoint presentation and patient guidebook. To enhance the learning environment the use of posters, models and videos may be added. Patients will briefly review the concepts of the Pritikin Eating Plan and the importance of low-calorie dense foods. The concept of mindful eating will be introduced as well as the importance of paying attention to internal hunger signals. Triggers for non-hunger eating and techniques for dealing with triggers will be explored. The purpose of this lesson is to provide patients with the opportunity to review the basic principles of the Pritikin Eating Plan, discuss the value of eating mindfully and how to measure internal cues of hunger and fullness using the Hunger Scale. Patients will also discuss  reasons for non-hunger eating and learn strategies to use for controlling emotional eating.  Targeting Your Nutrition Priorities Clinical staff led group instruction and group discussion with PowerPoint presentation and patient guidebook. To enhance the learning environment the use of posters, models and videos may be added. Patients will learn how to determine their genetic susceptibility to disease by reviewing their family history. Patients will gain insight into the importance of diet as part of an overall healthy lifestyle in mitigating the impact of genetics and other environmental insults. The purpose of this lesson is to provide patients with the opportunity to assess their personal nutrition priorities by looking at their family history, their own health history and current risk factors. Patients will also be able to discuss ways of prioritizing and modifying the Pritikin Eating Plan for their highest risk areas  Menu  Clinical staff led group instruction and group discussion with PowerPoint presentation and patient guidebook. To enhance the learning environment the use of posters, models and videos may be added. Using menus brought in from [employer],  or printed from [employer], patients will apply the Pritikin dining out guidelines that were presented in the [employer] video. Patients will also be able to practice these guidelines in a variety of provided scenarios. The purpose of this lesson is to provide patients with the opportunity to practice hands-on learning of the Pritikin Dining Out guidelines with actual menus and practice scenarios.  Label Reading Clinical staff led group instruction and group discussion with PowerPoint presentation and patient guidebook. To enhance the learning environment the use of posters, models and videos may be added. Patients will review and discuss the Pritikin label reading guidelines presented in Pritikin's Label Reading Educational series video. Using fool labels brought in from local grocery stores and markets, patients will apply the label reading guidelines and determine if the packaged food meet the Pritikin guidelines. The purpose of this lesson is to provide patients with the opportunity to review, discuss, and practice hands-on learning of the Pritikin Label Reading guidelines with actual packaged food labels. Cooking School  Pritikin's [employer] are designed to teach patients ways to prepare quick, simple, and affordable recipes at home. The importance of nutrition's role in chronic disease risk reduction is reflected in its emphasis in the overall Pritikin program. By learning how to prepare essential core Pritikin Eating Plan recipes, patients will increase control over what they eat; be able to customize the flavor of foods without the use of added salt, sugar, or fat; and improve the quality of the food they consume. By learning a set of core recipes which are easily assembled, quickly prepared, and affordable, patients are more likely to prepare more healthy foods at home. These workshops focus on convenient breakfasts, simple entres, side dishes, and desserts which  can be prepared with minimal effort and are consistent with nutrition recommendations for cardiovascular risk reduction. Cooking [employer] are taught by a Armed forces logistics/support/administrative officer (RD) who has been trained by the [employer]. The chef or RD has a clear understanding of the importance of minimizing - if not completely eliminating - added fat, sugar, and sodium in recipes. Throughout the series of Cooking School Workshop sessions, patients will learn about healthy ingredients and efficient methods of cooking to build confidence in their capability to prepare    Cooking School weekly topics:  Adding Flavor- Sodium-Free  Fast and Healthy Breakfasts  Powerhouse Plant-Based Proteins  Satisfying Salads and Dressings  Simple Sides and Sauces  International Cuisine-Spotlight on the [employer] Zones  Delicious Desserts  Savory Soups  [employer] - Meals in a Snap  Tasty Appetizers and Snacks  Comforting Weekend Breakfasts  One-Pot Wonders   Fast Evening Meals  Landscape architect Your Pritikin Plate  WORKSHOPS   Healthy Mindset (Psychosocial):  Focused Goals, Sustainable Changes Clinical staff led group instruction and group discussion with PowerPoint presentation and patient guidebook. To enhance the learning environment the use of posters, models and videos may be added. Patients will be able to apply effective goal setting strategies to establish at least one personal goal, and then take consistent, meaningful action toward that goal. They will learn to identify common barriers to achieving personal goals and develop strategies to overcome them. Patients will also gain an understanding of how our mind-set can impact our ability to achieve goals and the importance of cultivating a positive and growth-oriented mind-set. The purpose of this lesson is to provide patients with a deeper understanding of how to set and achieve personal goals, as well as the tools  and strategies needed to overcome common obstacles which may arise along the way.  From Head to Heart: The Power of a Healthy Outlook  Clinical staff led group instruction and group discussion with PowerPoint presentation and patient guidebook. To enhance the learning environment the use of posters, models and videos may be added. Patients will be able to recognize and describe the impact of emotions and mood on physical health. They will discover the importance of self-care and explore self-care practices which may work for them. Patients will also learn how to utilize the 4 C's to cultivate a healthier outlook and better manage stress and challenges. The purpose of this lesson is to demonstrate to patients how a healthy outlook is an essential part of maintaining good health, especially as they continue their cardiac rehab journey.  Healthy Sleep for a Healthy Heart Clinical staff led group instruction and group discussion with PowerPoint presentation and patient guidebook. To enhance the learning environment the use of posters, models and videos may be added. At the conclusion of this workshop, patients will be able to demonstrate knowledge of the importance of sleep to overall health, well-being, and quality of life. They will understand the symptoms of, and treatments for, common sleep disorders. Patients will also be able to identify daytime and nighttime behaviors which impact sleep, and they will be able to apply these tools to help manage sleep-related challenges. The purpose of this lesson is to provide patients with a general overview of sleep and outline the importance of quality sleep. Patients will learn about a few of the most common sleep disorders. Patients will also be introduced to the concept of "sleep hygiene," and discover ways to self-manage certain sleeping problems through simple daily behavior changes. Finally, the workshop will motivate patients by clarifying the links between  quality sleep and their goals of heart-healthy living.   Recognizing and Reducing Stress Clinical staff led group instruction and group discussion with PowerPoint presentation and patient guidebook. To enhance the learning environment the use of posters, models and videos may be added. At the conclusion of this workshop, patients will be able to understand the types of stress reactions, differentiate between acute and chronic stress, and recognize the impact that chronic stress has on their health. They will also be able to apply different coping mechanisms, such as reframing negative self-talk. Patients will have the opportunity to practice a variety of  stress management techniques, such as deep abdominal breathing, progressive muscle relaxation, and/or guided imagery.  The purpose of this lesson is to educate patients on the role of stress in their lives and to provide healthy techniques for coping with it.  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 Doe not have a prosthesis   Learning Preferences Audio;Individual Instruction;Group Instruction;Video;Verbal Instruction;Skilled Demonstration;Pictoral;Written Material             Education Topics:  Knowledge Questionnaire Score:  Knowledge Questionnaire Score - 02/15/22 1420       Knowledge Questionnaire Score   Pre Score 18/24             Core Components/Risk Factors/Patient Goals at Admission:  Personal Goals and Risk Factors at Admission - 02/15/22 1357       Core Components/Risk Factors/Patient Goals on Admission    Weight Management Yes;Weight Maintenance    Intervention Weight Management: Develop a combined nutrition and exercise program designed to reach desired caloric intake, while maintaining appropriate intake of nutrient and fiber, sodium and fats, and  appropriate energy expenditure required for the weight goal.;Weight Management: Provide education and appropriate resources to help participant work on and attain dietary goals.    Expected Outcomes Short Term: Continue to assess and modify interventions until short term weight is achieved;Long Term: Adherence to nutrition and physical activity/exercise program aimed toward attainment of established weight goal;Understanding recommendations for meals to include 15-35% energy as protein, 25-35% energy from fat, 35-60% energy from carbohydrates, less than 200mg  of dietary cholesterol, 20-35 gm of total fiber daily    Diabetes Yes    Intervention Provide education about signs/symptoms and action to take for hypo/hyperglycemia.;Provide education about proper nutrition, including hydration, and aerobic/resistive exercise prescription along with prescribed medications to achieve blood glucose in normal ranges: Fasting glucose 65-99 mg/dL    Expected Outcomes Short Term: Participant verbalizes understanding of the signs/symptoms and immediate care of hyper/hypoglycemia, proper foot care and importance of medication, aerobic/resistive exercise and nutrition plan for blood glucose control.;Long Term: Attainment of HbA1C < 7%.    Hypertension Yes    Intervention Provide education on lifestyle modifcations including regular physical activity/exercise, weight management, moderate sodium restriction and increased consumption of fresh fruit, vegetables, and low fat dairy, alcohol moderation, and smoking cessation.;Monitor prescription use compliance.    Expected Outcomes Short Term: Continued assessment and intervention until BP is < 140/33mm HG in hypertensive participants. < 130/45mm HG in hypertensive participants with diabetes, heart failure or chronic kidney disease.;Long Term: Maintenance of blood pressure at goal levels.    Lipids Yes    Intervention Provide education and support for participant on nutrition &  aerobic/resistive exercise along with prescribed medications to achieve LDL 70mg , HDL >40mg .    Expected Outcomes Short Term: Participant states understanding of desired cholesterol values and is compliant with medications prescribed. Participant is following exercise prescription and nutrition guidelines.;Long Term: Cholesterol controlled with medications as prescribed, with individualized exercise RX and with personalized nutrition plan. Value goals: LDL < 70mg , HDL > 40 mg.    Stress Yes    Intervention Offer individual and/or small group education and counseling on adjustment to heart disease, stress management and health-related lifestyle change. Teach and support self-help strategies.;Refer participants experiencing significant psychosocial distress to appropriate mental health specialists for further evaluation and treatment. When  possible, include family members and significant others in education/counseling sessions.    Expected Outcomes Short Term: Participant demonstrates changes in health-related behavior, relaxation and other stress management skills, ability to obtain effective social support, and compliance with psychotropic medications if prescribed.;Long Term: Emotional wellbeing is indicated by absence of clinically significant psychosocial distress or social isolation.    Personal Goal Other Yes    Personal Goal Increase mobility to be able to return to golfing.    Intervention Provide aerobic and stretching exercise routine to help improve mobility and flexibilty to be able to return to golf once cleared by cardiology.    Expected Outcomes Patient will have to mobility to be able to resume golfing once cleared by cardiologist to do so.             Core Components/Risk Factors/Patient Goals Review:   Goals and Risk Factor Review     Row Name 02/28/22 0842 03/29/22 1643 04/21/22 1430         Core Components/Risk Factors/Patient Goals Review   Personal Goals Review Weight  Management/Obesity;Stress;Hypertension;Lipids;Diabetes Weight Management/Obesity;Stress;Hypertension;Lipids;Diabetes Weight Management/Obesity;Stress;Hypertension;Lipids;Diabetes     Review Tod started intensive cardiac rehab on 02/21/22 and is off to a good start to exercise. Vital signs and CBG's have been stable Toran is doing well with exercise at  intensive cardiac rehab  Vital signs and CBG's have been stable. Roshon reports feeling stronger and says he has more energy doing things around the house Neko is doing well with exercise at  intensive cardiac rehab  Vital signs and CBG's have been stable. Tyrail has lost 2.3 kg since starting the program. Obrian will complete intensive cardiac rehab on 05/06/22     Expected Outcomes Maaz will continue to participate in intensive cardiac rehab for exercise, nutrition and lifestyle modifications Rosie will continue to participate in intensive cardiac rehab for exercise, nutrition and lifestyle modifications Logun will continue to participate in intensive cardiac rehab for exercise, nutrition and lifestyle modifications              Core Components/Risk Factors/Patient Goals at Discharge (Final Review):   Goals and Risk Factor Review - 04/21/22 1430       Core Components/Risk Factors/Patient Goals Review   Personal Goals Review Weight Management/Obesity;Stress;Hypertension;Lipids;Diabetes    Review Curlee is doing well with exercise at  intensive cardiac rehab  Vital signs and CBG's have been stable. Yunus has lost 2.3 kg since starting the program. Theodis will complete intensive cardiac rehab on 05/06/22    Expected Outcomes Ritesh will continue to participate in intensive cardiac rehab for exercise, nutrition and lifestyle modifications             ITP Comments:  ITP Comments     Row Name 02/15/22 1014 02/22/22 1708 03/29/22 1638 04/21/22 1414     ITP Comments Medical Director- Dr. Armanda Magic, MD, Introduction to [facility]  Education Program/ Intensive Cardiac Rehab. Initial Orientation Packet reviewed with the Patient 30 Day ITP review. Freddi started intensive cardiac rehab on 02/21/22 and did well with exercise 30 Day ITP review. Demitrious has good attendance and participation in intensive cardiac rehab 30 Day ITP review. Mable continues to have  good attendance and participation in intensive cardiac rehab. Birt will complete intensive cardiac rehab on 05/06/22             Comments: See ITP Comments

## 2022-04-26 DIAGNOSIS — R310 Gross hematuria: Secondary | ICD-10-CM | POA: Diagnosis not present

## 2022-04-26 DIAGNOSIS — R82998 Other abnormal findings in urine: Secondary | ICD-10-CM | POA: Diagnosis not present

## 2022-04-26 DIAGNOSIS — R350 Frequency of micturition: Secondary | ICD-10-CM | POA: Diagnosis not present

## 2022-04-27 ENCOUNTER — Encounter (HOSPITAL_COMMUNITY)
Admission: RE | Admit: 2022-04-27 | Discharge: 2022-04-27 | Disposition: A | Discharge status: Home / Self Care | Payer: Medicare Other | Source: Ambulatory Visit | Attending: Internal Medicine | Admitting: Internal Medicine

## 2022-04-27 DIAGNOSIS — Z951 Presence of aortocoronary bypass graft: Secondary | ICD-10-CM

## 2022-04-27 DIAGNOSIS — Z48812 Encounter for surgical aftercare following surgery on the circulatory system: Secondary | ICD-10-CM | POA: Diagnosis not present

## 2022-04-29 ENCOUNTER — Encounter (HOSPITAL_COMMUNITY)
Admission: RE | Admit: 2022-04-29 | Discharge: 2022-04-29 | Disposition: A | Discharge status: Home / Self Care | Payer: Medicare Other | Source: Ambulatory Visit | Attending: Internal Medicine | Admitting: Internal Medicine

## 2022-04-29 DIAGNOSIS — Z951 Presence of aortocoronary bypass graft: Secondary | ICD-10-CM

## 2022-04-29 NOTE — Progress Notes (Signed)
Incomplete Session Note  Patient Details  Name: Andrew [name] MRN: [id number] Date of Birth: [birth year] Referring Provider:   Flowsheet Row INTENSIVE CARDIAC REHAB ORIENT from 02/15/2022 in [facility]  Referring Provider Pricilla Riffle, MD       [name] did not complete his rehab session.  Lang reported that he has been experiencing GI upset  since he has been taking doxycycline and reported to another classmate that he vomited en route to coming to exercise at cardiac rehab. Sylus says he is going to call Dr Copland's office to discuss. I advised Moise that he not exercise today. Antone plans to return to exercise on Monday if he feels better.Thayer Headings RN BSN

## 2022-05-02 ENCOUNTER — Encounter (HOSPITAL_COMMUNITY)
Admission: RE | Admit: 2022-05-02 | Discharge: 2022-05-02 | Disposition: A | Discharge status: Home / Self Care | Payer: Medicare Other | Source: Ambulatory Visit | Attending: Internal Medicine | Admitting: Internal Medicine

## 2022-05-02 DIAGNOSIS — Z48812 Encounter for surgical aftercare following surgery on the circulatory system: Secondary | ICD-10-CM | POA: Diagnosis not present

## 2022-05-02 DIAGNOSIS — Z951 Presence of aortocoronary bypass graft: Secondary | ICD-10-CM | POA: Diagnosis not present

## 2022-05-03 ENCOUNTER — Other Ambulatory Visit: Payer: Medicare Other

## 2022-05-04 ENCOUNTER — Encounter (HOSPITAL_COMMUNITY)
Admission: RE | Admit: 2022-05-04 | Discharge: 2022-05-04 | Disposition: A | Discharge status: Home / Self Care | Payer: Medicare Other | Source: Ambulatory Visit | Attending: Cardiovascular Disease | Admitting: Cardiovascular Disease

## 2022-05-04 DIAGNOSIS — Z951 Presence of aortocoronary bypass graft: Secondary | ICD-10-CM | POA: Diagnosis not present

## 2022-05-04 DIAGNOSIS — Z48812 Encounter for surgical aftercare following surgery on the circulatory system: Secondary | ICD-10-CM | POA: Diagnosis not present

## 2022-05-04 NOTE — Progress Notes (Signed)
Discharge Progress Report  Patient Details  Name: Andrew [name] MRN: [id number] Date of Birth: 17-Feb-1950 Referring Provider:   Flowsheet Row INTENSIVE CARDIAC REHAB ORIENT from 02/15/2022 in [facility]  Referring Provider Pricilla Riffle, MD        Number of Visits: 21  Reason for Discharge:  Patient reached a stable level of exercise. Patient independent in their exercise. Patient has met program and personal goals.  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  ADL [facility]:   Initial Exercise Prescription:  Initial Exercise Prescription - 02/15/22 1400       Date of Initial Exercise RX and Referring Provider   Date 02/15/22    Referring Provider Pricilla Riffle, MD    Expected Discharge Date 04/29/22      Recumbant Bike   Level 1    Watts 17    Minutes 15    METs 2.6      NuStep   Level 1    SPM 85    Minutes 15    METs 2      Prescription Details   Frequency (times per week) 3    Duration Progress to 30 minutes of continuous aerobic without signs/symptoms of physical distress      Intensity   THRR 40-80% of Max Heartrate 60-120    Ratings of Perceived Exertion 11-13    Perceived Dyspnea 0-4      Progression   Progression Continue to progress workloads to maintain intensity without signs/symptoms of physical distress.      Resistance Training   Training Prescription Yes    Weight 3 lbs    Reps 10-15             Discharge Exercise Prescription (Final Exercise Prescription Changes):  Exercise Prescription Changes - 05/06/22 1026       Response to Exercise   Blood Pressure (Admit) 128/66    Blood Pressure (Exercise) 156/70    Blood Pressure (Exit) 112/60    Heart Rate (Admit) 71 bpm    Heart Rate (Exercise) 105 bpm    Heart Rate (Exit) 80 bpm    Rating of Perceived Exertion (Exercise) 12     Symptoms None    Comments Patient completed the cardiac rehab program today.    Duration Continue with 30 min of aerobic exercise without signs/symptoms of physical distress.    Intensity THRR unchanged      Progression   Progression Continue to progress workloads to maintain intensity without signs/symptoms of physical distress.    Average METs 2.1      Resistance Training   Training Prescription Yes    Weight 4 lbs    Reps 10-15    Time 10 Minutes      Interval Training   Interval Training No      Recumbant Bike   Level 3    RPM 55    Watts 30    Minutes 15    METs 2.2      NuStep   Level 3    SPM 99    Minutes 15    METs 2      Home Exercise Plan   Plans to continue exercise at Home (comment)   Walking   Frequency Add 4 additional days to program exercise sessions.    Initial Home Exercises  Provided 03/25/22             Functional Capacity:  6 Minute Walk     Row Name 02/15/22 1208 05/04/22 0954       6 Minute Walk   Phase Initial Discharge    Distance 1044 feet 1177 feet    Distance Feet Change -- 133 ft    Walk Time 6 minutes 6 minutes    # of Rest Breaks 0 0    MPH 1.98 2.23    METS 2.6 3.07    RPE 9 10    Perceived Dyspnea  0 0    VO2 Peak 9.11 10.74    Symptoms No No    Resting HR 80 bpm 88 bpm    Resting BP 122/70 108/58    Resting Oxygen Saturation  96 % 99 %    Exercise Oxygen Saturation  during 6 min walk 97 % 99 %    Max Ex. HR 98 bpm 111 bpm    Max Ex. BP 138/74 142/62    2 Minute Post BP 128/74 128/62             Psychological, QOL, Others - Outcomes: PHQ 2/9:    05/04/2022   10:48 AM 02/15/2022   10:32 AM 02/07/2022   10:48 AM 12/21/2021   11:06 AM 12/15/2021   12:08 PM  Depression screen PHQ 2/9  Decreased Interest 0 0 0 0 0  Down, Depressed, Hopeless 0 1 0 0 0  PHQ - 2 Score 0 1 0 0 0  Altered sleeping 1 1     Tired, decreased energy 0 1     Change in appetite 0 3     Feeling bad or failure about yourself  0 1      Trouble concentrating 0 0     Moving slowly or fidgety/restless 0 0     Suicidal thoughts 0 0     PHQ-9 Score 1 7     Difficult doing work/chores Not difficult at all Not difficult at all       Quality of Life:  Quality of Life - 05/04/22 1627       Quality of Life   Select Quality of Life      Quality of Life Scores   Health/Function Pre 15.2 %    Health/Function Post 20.7 %    Health/Function % Change 36.18 %    Socioeconomic Pre 25.2 %    Socioeconomic Post 24.17 %    Socioeconomic % Change  -4.09 %    Psych/Spiritual Pre 18.86 %    Psych/Spiritual Post 25.21 %    Psych/Spiritual % Change 33.67 %    Family Pre 21.6 %    Family Post 21.4 %    Family % Change -0.93 %    GLOBAL Pre 18.56 %    GLOBAL Post 22.39 %    GLOBAL % Change 20.64 %             Personal Goals: Goals established at orientation with interventions provided to work toward goal.  Personal Goals and Risk Factors at Admission - 02/15/22 1357       Core Components/Risk Factors/Patient Goals on Admission    Weight Management Yes;Weight Maintenance    Intervention Weight Management: Develop a combined nutrition and exercise program designed to reach desired caloric intake, while maintaining appropriate intake of nutrient and fiber, sodium and fats, and appropriate energy expenditure required for the weight goal.;Weight Management: Provide education and  appropriate resources to help participant work on and attain dietary goals.    Expected Outcomes Short Term: Continue to assess and modify interventions until short term weight is achieved;Long Term: Adherence to nutrition and physical activity/exercise program aimed toward attainment of established weight goal;Understanding recommendations for meals to include 15-35% energy as protein, 25-35% energy from fat, 35-60% energy from carbohydrates, less than 200mg  of dietary cholesterol, 20-35 gm of total fiber daily    Diabetes Yes    Intervention Provide  education about signs/symptoms and action to take for hypo/hyperglycemia.;Provide education about proper nutrition, including hydration, and aerobic/resistive exercise prescription along with prescribed medications to achieve blood glucose in normal ranges: Fasting glucose 65-99 mg/dL    Expected Outcomes Short Term: Participant verbalizes understanding of the signs/symptoms and immediate care of hyper/hypoglycemia, proper foot care and importance of medication, aerobic/resistive exercise and nutrition plan for blood glucose control.;Long Term: Attainment of HbA1C < 7%.    Hypertension Yes    Intervention Provide education on lifestyle modifcations including regular physical activity/exercise, weight management, moderate sodium restriction and increased consumption of fresh fruit, vegetables, and low fat dairy, alcohol moderation, and smoking cessation.;Monitor prescription use compliance.    Expected Outcomes Short Term: Continued assessment and intervention until BP is < 140/54mm HG in hypertensive participants. < 130/3mm HG in hypertensive participants with diabetes, heart failure or chronic kidney disease.;Long Term: Maintenance of blood pressure at goal levels.    Lipids Yes    Intervention Provide education and support for participant on nutrition & aerobic/resistive exercise along with prescribed medications to achieve LDL 70mg , HDL >40mg .    Expected Outcomes Short Term: Participant states understanding of desired cholesterol values and is compliant with medications prescribed. Participant is following exercise prescription and nutrition guidelines.;Long Term: Cholesterol controlled with medications as prescribed, with individualized exercise RX and with personalized nutrition plan. Value goals: LDL < 70mg , HDL > 40 mg.    Stress Yes    Intervention Offer individual and/or small group education and counseling on adjustment to heart disease, stress management and health-related lifestyle change.  Teach and support self-help strategies.;Refer participants experiencing significant psychosocial distress to appropriate mental health specialists for further evaluation and treatment. When possible, include family members and significant others in education/counseling sessions.    Expected Outcomes Short Term: Participant demonstrates changes in health-related behavior, relaxation and other stress management skills, ability to obtain effective social support, and compliance with psychotropic medications if prescribed.;Long Term: Emotional wellbeing is indicated by absence of clinically significant psychosocial distress or social isolation.    Personal Goal Other Yes    Personal Goal Increase mobility to be able to return to golfing.    Intervention Provide aerobic and stretching exercise routine to help improve mobility and flexibilty to be able to return to golf once cleared by cardiology.    Expected Outcomes Patient will have to mobility to be able to resume golfing once cleared by cardiologist to do so.              Personal Goals Discharge:  Goals and Risk Factor Review     Row Name 02/28/22 1610 03/29/22 1643 04/21/22 1430         Core Components/Risk Factors/Patient Goals Review   Personal Goals Review Weight Management/Obesity;Stress;Hypertension;Lipids;Diabetes Weight Management/Obesity;Stress;Hypertension;Lipids;Diabetes Weight Management/Obesity;Stress;Hypertension;Lipids;Diabetes     Review [place] started intensive cardiac rehab on 02/21/22 and is off to a good start to exercise. Vital signs and CBG's have been stable Adreon is doing well with exercise at  intensive cardiac  rehab  Vital signs and CBG's have been stable. Kippy reports feeling stronger and says he has more energy doing things around the house Amuel is doing well with exercise at  intensive cardiac rehab  Vital signs and CBG's have been stable. Boyde has lost 2.3 kg since starting the program. Miguel will complete  intensive cardiac rehab on 05/06/22     Expected Outcomes Joell will continue to participate in intensive cardiac rehab for exercise, nutrition and lifestyle modifications Seananthony will continue to participate in intensive cardiac rehab for exercise, nutrition and lifestyle modifications Jermell will continue to participate in intensive cardiac rehab for exercise, nutrition and lifestyle modifications              Exercise Goals and Review:  Exercise Goals     Row Name 02/15/22 1051             Exercise Goals   Increase Physical Activity Yes       Intervention Provide advice, education, support and counseling about physical activity/exercise needs.;Develop an individualized exercise prescription for aerobic and resistive training based on initial evaluation findings, risk stratification, comorbidities and participant's personal goals.       Expected Outcomes Short Term: Attend rehab on a regular basis to increase amount of physical activity.;Long Term: Add in home exercise to make exercise part of routine and to increase amount of physical activity.;Long Term: Exercising regularly at least 3-5 days a week.       Increase Strength and Stamina Yes       Intervention Provide advice, education, support and counseling about physical activity/exercise needs.;Develop an individualized exercise prescription for aerobic and resistive training based on initial evaluation findings, risk stratification, comorbidities and participant's personal goals.       Expected Outcomes Short Term: Increase workloads from initial exercise prescription for resistance, speed, and METs.;Short Term: Perform resistance training exercises routinely during rehab and add in resistance training at home;Long Term: Improve cardiorespiratory fitness, muscular endurance and strength as measured by increased METs and functional capacity ( )       Able to understand and use rate of perceived exertion (RPE) scale Yes        Intervention Provide education and explanation on how to use RPE scale       Expected Outcomes Short Term: Able to use RPE daily in rehab to express subjective intensity level;Long Term:  Able to use RPE to guide intensity level when exercising independently       Knowledge and understanding of Target Heart Rate Range (THRR) Yes       Intervention Provide education and explanation of THRR including how the numbers were predicted and where they are located for reference       Expected Outcomes Short Term: Able to state/look up THRR;Long Term: Able to use THRR to govern intensity when exercising independently;Short Term: Able to use daily as guideline for intensity in rehab       Able to check pulse independently Yes       Intervention Provide education and demonstration on how to check pulse in carotid and radial arteries.;Review the importance of being able to check your own pulse for safety during independent exercise       Expected Outcomes Short Term: Able to explain why pulse checking is important during independent exercise;Long Term: Able to check pulse independently and accurately       Understanding of Exercise Prescription Yes       Intervention Provide education, explanation, and written materials  on patient's individual exercise prescription       Expected Outcomes Short Term: Able to explain program exercise prescription;Long Term: Able to explain home exercise prescription to exercise independently                Exercise Goals Re-Evaluation:  Exercise Goals Re-Evaluation     Row Name 02/21/22 1136 03/04/22 1132 03/25/22 0954 04/25/22 1056 05/06/22 1130     Exercise Goal Re-Evaluation   Exercise Goals Review Increase Physical Activity;Able to understand and use rate of perceived exertion (RPE) scale;Increase Strength and Stamina Increase Physical Activity;Able to understand and use rate of perceived exertion (RPE) scale;Increase Strength and Stamina Increase Physical  Activity;Able to understand and use rate of perceived exertion (RPE) scale;Increase Strength and Stamina;Knowledge and understanding of Target Heart Rate Range (THRR);Understanding of Exercise Prescription Increase Physical Activity;Able to understand and use rate of perceived exertion (RPE) scale;Increase Strength and Stamina;Knowledge and understanding of Target Heart Rate Range (THRR);Understanding of Exercise Prescription Increase Physical Activity;Able to understand and use rate of perceived exertion (RPE) scale;Increase Strength and Stamina;Knowledge and understanding of Target Heart Rate Range (THRR);Understanding of Exercise Prescription   Comments Patient able to understand and use RPE scale appropriately. Patient is making gradual progress with exercise. Reviewed exercise prescription with patient. Patient is walking 1.5 miles in about 20 minutes daily. Patient is also stretching and using his 10 lb weights 3 days/week. Discussed decreasing weights to 1 day/week in addition to the 2 days/week at home since the 10 lb are a little heavy for him. Increased hand weights at CR from 3 to 4 lbs today and tolerated well. Discussed how to increase MET level, and increased WL on RB from 2 to 2.5 the last minute and tolerated well. Patient is making good progress with exercise. Patient is walking at home and has no concerns about his exercise action plan at this time. Patient completed the cardiac rehab program and made gradual progress with exercise. Patient's functional capacity increased 13% as measured by and strength increased 23% as measured by grip strength test. He plans to continue walking and using his weights at home as his mode of home exercise.   Expected Outcomes Progress workloads as tolerated to help increase strength and stamina, increase mobility. Continue to progress workloads as tolerated. Patient will continue daily exercise routine. Continue to increase WL and weights as tolerated to help  increase strength and stamina. Continue to progress workloads as tolerated. Patient will walk 30 minutes 5-7 days/week to maintain health and fitness gains.            Nutrition & Weight - Outcomes:  Pre Biometrics - 02/15/22 1014       Pre Biometrics   Waist Circumference 45 inches    Hip Circumference 43.25 inches    Waist to Hip Ratio 1.04 %    Triceps Skinfold 24 mm    % Body Fat 31.3 %    Grip Strength 30 kg    Flexibility --   Not performed, knee issues.   Single Leg Stand 7.56 seconds             Post Biometrics - 05/06/22 1000        Post  Biometrics   Height 6' 0.25" (1.835 m)    Waist Circumference 43.5 inches    Hip Circumference 42.25 inches    Waist to Hip Ratio 1.03 %    BMI (Calculated) 26.25    Triceps Skinfold 24 mm    %  Body Fat 30.4 %    Grip Strength 37 kg    Flexibility --   Not performed, knee issues.   Single Leg Stand 3.18 seconds             Nutrition:  Nutrition Therapy & Goals - 04/15/22 1130       Nutrition Therapy   Diet Heart healthy/Carbohydrate Consistent Diet    Drug/Food Interactions Statins/Certain Fruits      Personal Nutrition Goals   Nutrition Goal Patient to identify strategies for reducing cardiovascular risk by attending the weekly [facility] education and nutrition series    Personal Goal #2 Patient to improve diet quality by using the plate method as a daily guide for meal planning to include lean protein/plant protein, fruits, vegetables, whole grains, nonfat dairy as part of well balanced diet    Personal Goal #3 Patient to identify strategies for weight gain of 0.5-2.0# per week.    Personal Goal #4 Patient to reduce sodium intake to 1500mg  per day    Comments Goals in progress. Moshe stopped Rybelsus due to continued unwanted weightloss; he continues follow-up with PCP and Endo to switch to a weight neutral option. His weight loss has stabililized at this point. Idriss continues to attend the [employer] and nutrition series regularly. Dung will continue to benefit from participation in intensive cardiac rehab for nutrition, exercise, and lifestyle modification.      Intervention Plan   Intervention Prescribe, educate and counsel regarding individualized specific dietary modifications aiming towards targeted core components such as weight, hypertension, lipid management, diabetes, heart failure and other comorbidities.;Nutrition handout(s) given to patient.    Expected Outcomes Short Term Goal: Understand basic principles of dietary content, such as calories, fat, sodium, cholesterol and nutrients.;Long Term Goal: Adherence to prescribed nutrition plan.             Nutrition Discharge:  Nutrition Assessments - 04/29/22 0947       Rate Your Plate Scores   Post Score 81             Education Questionnaire Score:  Knowledge Questionnaire Score - 05/04/22 1630       Knowledge Questionnaire Score   Pre Score 18/24    Post Score 18/24             Goals reviewed with patient; copy given to patient.Pt graduates from  Intensive cardiac rehab program on 05/04/22 with completion of  30 exercise and  29 education sessions. Pt maintained good attendance and progressed nicely during their participation in rehab as evidenced by increased MET level. Ravin increased his distance on his post exercise walk test by 133 feet. Pele lost 2.5 kg while participating in the program. Medication list reconciled. Repeat  PHQ score- 1 .  Pt has made significant lifestyle changes and should be commended for their success. Atul achieved their goals during cardiac rehab.   Pt plans to continue exercise at the [facility] 3 days a week, walking and using hand weights. Dakarai says he feels stronger since he has participating in the program. We are proud of Ovid's progress!Thayer Headings RN BSN

## 2022-05-06 ENCOUNTER — Ambulatory Visit: Payer: Medicare Other | Admitting: Endocrinology

## 2022-05-06 ENCOUNTER — Encounter (HOSPITAL_COMMUNITY)
Admission: RE | Admit: 2022-05-06 | Discharge: 2022-05-06 | Disposition: A | Discharge status: Home / Self Care | Payer: Medicare Other | Source: Ambulatory Visit | Attending: Cardiovascular Disease | Admitting: Cardiovascular Disease

## 2022-05-06 VITALS — BP 128/66 | HR 71 | Ht 72.25 in | Wt 194.9 lb

## 2022-05-06 DIAGNOSIS — Z48812 Encounter for surgical aftercare following surgery on the circulatory system: Secondary | ICD-10-CM | POA: Diagnosis not present

## 2022-05-06 DIAGNOSIS — Z951 Presence of aortocoronary bypass graft: Secondary | ICD-10-CM

## 2022-05-15 ENCOUNTER — Other Ambulatory Visit: Payer: Self-pay | Admitting: Family Medicine

## 2022-05-15 DIAGNOSIS — J309 Allergic rhinitis, unspecified: Secondary | ICD-10-CM

## 2022-05-23 DIAGNOSIS — R310 Gross hematuria: Secondary | ICD-10-CM | POA: Diagnosis not present
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
## 2022-05-24 ENCOUNTER — Encounter: Payer: Self-pay | Admitting: Internal Medicine

## 2022-05-24 ENCOUNTER — Ambulatory Visit: Payer: Medicare Other | Attending: Internal Medicine | Admitting: Internal Medicine

## 2022-05-24 VITALS — BP 108/66 | HR 61 | Ht 74.0 in | Wt 198.4 lb

## 2022-05-24 DIAGNOSIS — I25119 Atherosclerotic heart disease of native coronary artery with unspecified angina pectoris: Secondary | ICD-10-CM | POA: Insufficient documentation

## 2022-05-24 DIAGNOSIS — I25110 Atherosclerotic heart disease of native coronary artery with unstable angina pectoris: Secondary | ICD-10-CM

## 2022-05-24 DIAGNOSIS — E785 Hyperlipidemia, unspecified: Secondary | ICD-10-CM

## 2022-05-24 DIAGNOSIS — I2510 Atherosclerotic heart disease of native coronary artery without angina pectoris: Secondary | ICD-10-CM | POA: Diagnosis not present

## 2022-05-24 DIAGNOSIS — R0602 Shortness of breath: Secondary | ICD-10-CM | POA: Diagnosis not present

## 2022-05-24 DIAGNOSIS — I10 Essential (primary) hypertension: Secondary | ICD-10-CM

## 2022-05-24 NOTE — Patient Instructions (Signed)
Medication Instructions:   *If you need a refill on your cardiac medications before your next appointment, please call your pharmacy*   Lab Work: CBC, NMR, BMET  If you have labs (blood work) drawn today and your tests are completely normal, you will receive your results only by: MyChart Message (if you have MyChart) OR A paper copy in the mail If you have any lab test that is abnormal or we need to change your treatment, we will call you to review the results.   Testing/Procedures:    Follow-Up: At [facility], you and your health needs are our priority.  As part of our continuing mission to provide you with exceptional heart care, we have created designated Provider Care Teams.  These Care Teams include your primary Cardiologist (physician) and Advanced Practice Providers (APPs -  Physician Assistants and Nurse Practitioners) who all work together to provide you with the care you need, when you need it.  We recommend signing up for the patient portal called "MyChart".  Sign up information is provided on this After Visit Summary.  MyChart is used to connect with patients for Virtual Visits (Telemedicine).  Patients are able to view lab/test results, encounter notes, upcoming appointments, etc.  Non-urgent messages can be sent to your provider as well.   To learn more about what you can do with MyChart, go to [email or web address].    Your next appointment:   5 month(s) END OF SEPT OR OCT   Provider:   Dietrich Pates, MD     Other Instructions

## 2022-05-25 ENCOUNTER — Telehealth: Payer: Self-pay | Admitting: Cardiology

## 2022-05-25 ENCOUNTER — Observation Stay (HOSPITAL_COMMUNITY)
Admission: EM | Admit: 2022-05-25 | Discharge: 2022-05-26 | Disposition: A | Discharge status: Home / Self Care | Payer: Medicare Other | Attending: Family Medicine | Admitting: Family Medicine

## 2022-05-25 ENCOUNTER — Emergency Department (HOSPITAL_COMMUNITY): Payer: Medicare Other

## 2022-05-25 ENCOUNTER — Encounter (HOSPITAL_COMMUNITY): Payer: Self-pay

## 2022-05-25 ENCOUNTER — Other Ambulatory Visit: Payer: Self-pay

## 2022-05-25 DIAGNOSIS — K219 Gastro-esophageal reflux disease without esophagitis: Secondary | ICD-10-CM | POA: Diagnosis present

## 2022-05-25 DIAGNOSIS — N390 Urinary tract infection, site not specified: Secondary | ICD-10-CM | POA: Diagnosis present

## 2022-05-25 DIAGNOSIS — N1831 Chronic kidney disease, stage 3a: Secondary | ICD-10-CM | POA: Diagnosis not present

## 2022-05-25 DIAGNOSIS — Z79899 Other long term (current) drug therapy: Secondary | ICD-10-CM | POA: Insufficient documentation

## 2022-05-25 DIAGNOSIS — I10 Essential (primary) hypertension: Secondary | ICD-10-CM | POA: Diagnosis present

## 2022-05-25 DIAGNOSIS — Z7902 Long term (current) use of antithrombotics/antiplatelets: Secondary | ICD-10-CM | POA: Insufficient documentation

## 2022-05-25 DIAGNOSIS — E119 Type 2 diabetes mellitus without complications: Secondary | ICD-10-CM | POA: Diagnosis not present

## 2022-05-25 DIAGNOSIS — I129 Hypertensive chronic kidney disease with stage 1 through stage 4 chronic kidney disease, or unspecified chronic kidney disease: Secondary | ICD-10-CM | POA: Diagnosis not present

## 2022-05-25 DIAGNOSIS — R319 Hematuria, unspecified: Secondary | ICD-10-CM | POA: Insufficient documentation

## 2022-05-25 DIAGNOSIS — R7989 Other specified abnormal findings of blood chemistry: Secondary | ICD-10-CM | POA: Diagnosis present

## 2022-05-25 DIAGNOSIS — Z91040 Latex allergy status: Secondary | ICD-10-CM | POA: Diagnosis not present

## 2022-05-25 DIAGNOSIS — N179 Acute kidney failure, unspecified: Secondary | ICD-10-CM | POA: Diagnosis not present

## 2022-05-25 DIAGNOSIS — I2510 Atherosclerotic heart disease of native coronary artery without angina pectoris: Secondary | ICD-10-CM | POA: Diagnosis present

## 2022-05-25 DIAGNOSIS — Z7982 Long term (current) use of aspirin: Secondary | ICD-10-CM | POA: Diagnosis not present

## 2022-05-25 DIAGNOSIS — E875 Hyperkalemia: Secondary | ICD-10-CM | POA: Diagnosis not present

## 2022-05-25 DIAGNOSIS — E869 Volume depletion, unspecified: Secondary | ICD-10-CM | POA: Diagnosis not present

## 2022-05-25 DIAGNOSIS — N281 Cyst of kidney, acquired: Secondary | ICD-10-CM | POA: Diagnosis not present

## 2022-05-25 DIAGNOSIS — E785 Hyperlipidemia, unspecified: Secondary | ICD-10-CM | POA: Diagnosis present

## 2022-05-25 DIAGNOSIS — Z8673 Personal history of transient ischemic attack (TIA), and cerebral infarction without residual deficits: Secondary | ICD-10-CM | POA: Diagnosis not present

## 2022-05-25 DIAGNOSIS — Z951 Presence of aortocoronary bypass graft: Secondary | ICD-10-CM | POA: Diagnosis not present

## 2022-05-25 DIAGNOSIS — H409 Unspecified glaucoma: Secondary | ICD-10-CM | POA: Diagnosis present

## 2022-05-25 DIAGNOSIS — D649 Anemia, unspecified: Secondary | ICD-10-CM | POA: Diagnosis not present

## 2022-05-25 LAB — GLUCOSE, CAPILLARY
Glucose-Capillary: 115 mg/dL — ABNORMAL HIGH (ref 70–99)
Glucose-Capillary: 119 mg/dL — ABNORMAL HIGH (ref 70–99)
Glucose-Capillary: 94 mg/dL (ref 70–99)
Glucose-Capillary: 136 mg/dL — ABNORMAL HIGH (ref 70–99)

## 2022-05-25 LAB — CBC
Hematocrit: 33.3 % — ABNORMAL LOW (ref 37.5–51.0)
Hemoglobin: 10.5 g/dL — ABNORMAL LOW (ref 13.0–17.7)
MCH: 26.9 pg (ref 26.6–33.0)
MCHC: 31.5 g/dL (ref 31.5–35.7)
MCV: 85 fL (ref 79–97)
Platelets: 175 10*3/uL (ref 150–450)
RBC: 3.9 x10E6/uL — ABNORMAL LOW (ref 4.14–5.80)
RDW: 13.4 % (ref 11.6–15.4)
WBC: 4.6 10*3/uL (ref 3.4–10.8)

## 2022-05-25 LAB — BASIC METABOLIC PANEL
Anion gap: 9 (ref 5–15)
Anion gap: 9 (ref 5–15)
BUN/Creatinine Ratio: 17 (ref 10–24)
BUN: 47 mg/dL — ABNORMAL HIGH (ref 8–27)
BUN: 60 mg/dL — ABNORMAL HIGH (ref 8–23)
BUN: 46 mg/dL — ABNORMAL HIGH (ref 8–23)
CO2: 20 mmol/L (ref 20–29)
CO2: 19 mmol/L — ABNORMAL LOW (ref 22–32)
CO2: 16 mmol/L — ABNORMAL LOW (ref 22–32)
Calcium: 9.1 mg/dL (ref 8.6–10.2)
Calcium: 8.6 mg/dL — ABNORMAL LOW (ref 8.9–10.3)
Calcium: 8.4 mg/dL — ABNORMAL LOW (ref 8.9–10.3)
Chloride: 105 mmol/L (ref 96–106)
Chloride: 107 mmol/L (ref 98–111)
Chloride: 113 mmol/L — ABNORMAL HIGH (ref 98–111)
Creatinine, Ser: 2.78 mg/dL — ABNORMAL HIGH (ref 0.76–1.27)
Creatinine, Ser: 3.05 mg/dL — ABNORMAL HIGH (ref 0.61–1.24)
Creatinine, Ser: 2.12 mg/dL — ABNORMAL HIGH (ref 0.61–1.24)
GFR, Estimated: 21 mL/min — ABNORMAL LOW (ref >60)
GFR, Estimated: 33 mL/min — ABNORMAL LOW (ref >60)
Glucose, Bld: 113 mg/dL — ABNORMAL HIGH (ref 70–99)
Glucose, Bld: 114 mg/dL — ABNORMAL HIGH (ref 70–99)
Glucose: 114 mg/dL — ABNORMAL HIGH (ref 70–99)
Potassium: 5.8 mmol/L (ref 3.5–5.2)
Potassium: 4.9 mmol/L (ref 3.5–5.1)
Potassium: 4.6 mmol/L (ref 3.5–5.1)
Sodium: 137 mmol/L (ref 134–144)
Sodium: 135 mmol/L (ref 135–145)
Sodium: 138 mmol/L (ref 135–145)
eGFR: 24 mL/min/{1.73_m2} — ABNORMAL LOW (ref >59)

## 2022-05-25 LAB — NMR, LIPOPROFILE
Cholesterol, Total: 104 mg/dL (ref 100–199)
HDL Particle Number: 21 umol/L — ABNORMAL LOW (ref >=30.5)
HDL-C: 44 mg/dL (ref >39)
LDL Particle Number: 439 nmol/L (ref <1000)
LDL Size: 20.2 nm — ABNORMAL LOW (ref >20.5)
LDL-C (NIH Calc): 43 mg/dL (ref 0–99)
LP-IR Score: 26 (ref <=45)
Small LDL Particle Number: 239 nmol/L (ref <=527)
Triglycerides: 84 mg/dL (ref 0–149)

## 2022-05-25 LAB — URINALYSIS, ROUTINE W REFLEX MICROSCOPIC
Bilirubin Urine: NEGATIVE
Glucose, UA: >=500 mg/dL — AB
Ketones, ur: NEGATIVE mg/dL
Nitrite: NEGATIVE
Protein, ur: 100 mg/dL — AB
RBC / HPF: >50 RBC/hpf (ref 0–5)
Specific Gravity, Urine: 1.011 (ref 1.005–1.030)
WBC, UA: >50 WBC/hpf (ref 0–5)
pH: 5 (ref 5.0–8.0)

## 2022-05-25 LAB — CBC WITH DIFFERENTIAL/PLATELET
Abs Immature Granulocytes: 0.01 10*3/uL (ref 0.00–0.07)
Basophils Absolute: 0.1 10*3/uL (ref 0.0–0.1)
Basophils Relative: 1 %
Eosinophils Absolute: 0.4 10*3/uL (ref 0.0–0.5)
Eosinophils Relative: 7 %
HCT: 32.8 % — ABNORMAL LOW (ref 39.0–52.0)
Hemoglobin: 10.1 g/dL — ABNORMAL LOW (ref 13.0–17.0)
Immature Granulocytes: 0 %
Lymphocytes Relative: 35 %
Lymphs Abs: 1.7 10*3/uL (ref 0.7–4.0)
MCH: 27.4 pg (ref 26.0–34.0)
MCHC: 30.8 g/dL (ref 30.0–36.0)
MCV: 88.9 fL (ref 80.0–100.0)
Monocytes Absolute: 0.7 10*3/uL (ref 0.1–1.0)
Monocytes Relative: 15 %
Neutro Abs: 1.9 10*3/uL (ref 1.7–7.7)
Neutrophils Relative %: 42 %
Platelets: 157 10*3/uL (ref 150–400)
RBC: 3.69 MIL/uL — ABNORMAL LOW (ref 4.22–5.81)
RDW: 13.8 % (ref 11.5–15.5)
WBC: 4.7 10*3/uL (ref 4.0–10.5)
nRBC: 0 % (ref 0.0–0.2)

## 2022-05-25 LAB — HEPATIC FUNCTION PANEL
ALT: 27 U/L (ref 0–44)
AST: 25 U/L (ref 15–41)
Albumin: 3.7 g/dL (ref 3.5–5.0)
Alkaline Phosphatase: 101 U/L (ref 38–126)
Bilirubin, Direct: <0.1 mg/dL (ref 0.0–0.2)
Total Bilirubin: 0.3 mg/dL (ref 0.3–1.2)
Total Protein: 7.2 g/dL (ref 6.5–8.1)

## 2022-05-25 LAB — SODIUM, URINE, RANDOM: Sodium, Ur: 70 mmol/L

## 2022-05-25 LAB — CBG MONITORING, ED: Glucose-Capillary: 97 mg/dL (ref 70–99)

## 2022-05-25 LAB — CREATININE, URINE, RANDOM: Creatinine, Urine: 94 mg/dL

## 2022-05-25 LAB — CARBAMAZEPINE LEVEL, TOTAL: Carbamazepine Lvl: 6.4 ug/mL (ref 4.0–12.0)

## 2022-05-25 LAB — CK: Total CK: 40 U/L — ABNORMAL LOW (ref 49–397)

## 2022-05-25 MED ORDER — LATANOPROST 0.005 % OP SOLN
1.0000 [drp] | Freq: Every day | OPHTHALMIC | Status: DC
  Administered 2022-05-25: 1 [drp] via OPHTHALMIC
  Filled 2022-05-25: qty 2.5

## 2022-05-25 MED ORDER — DORZOLAMIDE HCL 2 % OP SOLN
1.0000 [drp] | Freq: Every day | OPHTHALMIC | Status: DC
  Filled 2022-05-25: qty 10

## 2022-05-25 MED ORDER — SODIUM CHLORIDE 0.9 % IV BOLUS
1000.0000 mL | Freq: Once | INTRAVENOUS | Status: AC
  Administered 2022-05-25: 1000 mL via INTRAVENOUS

## 2022-05-25 MED ORDER — CLONIDINE HCL 0.3 MG/24HR TD PTWK: 0.3000 mg | MEDICATED_PATCH | TRANSDERMAL | Status: DC

## 2022-05-25 MED ORDER — CARBAMAZEPINE 200 MG PO TABS
200.0000 mg | ORAL_TABLET | Freq: Every day | ORAL | Status: DC
  Administered 2022-05-25 – 2022-05-26 (×2): 200 mg via ORAL
  Filled 2022-05-25 (×2): qty 1

## 2022-05-25 MED ORDER — ONDANSETRON HCL 4 MG/2ML IJ SOLN: 4.0000 mg | Freq: Four times a day (QID) | INTRAMUSCULAR | Status: DC | PRN

## 2022-05-25 MED ORDER — SODIUM CHLORIDE 0.9 % IV SOLN
1.0000 g | INTRAVENOUS | Status: DC
  Administered 2022-05-26: 1 g via INTRAVENOUS
  Filled 2022-05-25: qty 10

## 2022-05-25 MED ORDER — PANTOPRAZOLE SODIUM 40 MG PO TBEC
40.0000 mg | DELAYED_RELEASE_TABLET | Freq: Every day | ORAL | Status: DC
  Administered 2022-05-25 – 2022-05-26 (×2): 40 mg via ORAL
  Filled 2022-05-25 (×2): qty 1

## 2022-05-25 MED ORDER — ASPIRIN 81 MG PO TBEC
81.0000 mg | DELAYED_RELEASE_TABLET | Freq: Every day | ORAL | Status: DC
  Administered 2022-05-25 – 2022-05-26 (×2): 81 mg via ORAL
  Filled 2022-05-25 (×2): qty 1

## 2022-05-25 MED ORDER — SODIUM CHLORIDE 0.9 % IV SOLN
1.0000 g | Freq: Once | INTRAVENOUS | Status: AC
  Administered 2022-05-25: 1 g via INTRAVENOUS
  Filled 2022-05-25: qty 10

## 2022-05-25 MED ORDER — TIMOLOL MALEATE 0.5 % OP SOLN
1.0000 [drp] | Freq: Every day | OPHTHALMIC | Status: DC
  Filled 2022-05-25: qty 5

## 2022-05-25 MED ORDER — ACETAMINOPHEN 325 MG PO TABS: 650.0000 mg | ORAL_TABLET | Freq: Four times a day (QID) | ORAL | Status: DC | PRN

## 2022-05-25 MED ORDER — ACETAMINOPHEN 650 MG RE SUPP: 650.0000 mg | Freq: Four times a day (QID) | RECTAL | Status: DC | PRN

## 2022-05-25 MED ORDER — DORZOLAMIDE HCL-TIMOLOL MAL 2-0.5 % OP SOLN: 1.0000 [drp] | Freq: Every day | OPHTHALMIC | Status: DC

## 2022-05-25 MED ORDER — INSULIN ASPART 100 UNIT/ML IJ SOLN: 0.0000 [IU] | Freq: Three times a day (TID) | INTRAMUSCULAR | Status: DC

## 2022-05-25 MED ORDER — CLOPIDOGREL BISULFATE 75 MG PO TABS
75.0000 mg | ORAL_TABLET | Freq: Every day | ORAL | Status: DC
  Administered 2022-05-25 – 2022-05-26 (×2): 75 mg via ORAL
  Filled 2022-05-25 (×2): qty 1

## 2022-05-25 MED ORDER — ONDANSETRON HCL 4 MG PO TABS: 4.0000 mg | ORAL_TABLET | Freq: Four times a day (QID) | ORAL | Status: DC | PRN

## 2022-05-25 MED ORDER — SODIUM CHLORIDE 0.9 % IV SOLN: INTRAVENOUS | Status: AC
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
## 2022-05-25 NOTE — Plan of Care (Signed)
  Problem: Education: Goal: Knowledge of General Education information will improve Description: Including pain rating scale, medication(s)/side effects and non-pharmacologic comfort measures Outcome: Progressing   Problem: Health Behavior/Discharge Planning: Goal: Ability to manage health-related needs will improve Outcome: Progressing   Problem: Clinical Measurements: Goal: Ability to maintain clinical measurements within normal limits will improve Outcome: Progressing   Problem: Activity: Goal: Risk for activity intolerance will decrease Outcome: Progressing   Problem: Elimination: Goal: Will not experience complications related to bowel motility Outcome: Progressing   Problem: Pain Managment: Goal: General experience of comfort will improve Outcome: Progressing   

## 2022-05-25 NOTE — ED Triage Notes (Signed)
Patient reports he was called earlier tonight about lab work he got done yesterday, was told his potassium is high.

## 2022-05-25 NOTE — Plan of Care (Signed)
  Problem: Education: Goal: Knowledge of General Education information will improve Description Including pain rating scale, medication(s)/side effects and non-pharmacologic comfort measures Outcome: Progressing   Problem: Health Behavior/Discharge Planning: Goal: Ability to manage health-related needs will improve Outcome: Progressing   
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
## 2022-05-25 NOTE — Telephone Encounter (Signed)
Received page overnight from [facility] about a critical lab result: K 5.8 (no hemolysis) with a new AKI with Cr 2.78 (from 1.26 prior). Called patient to inform him and his wife of the results. He was sleeping and has no complaints of CP, SOB, or palpitations. We discussed that his hyperkalemia is severe enough that he should be seen at his nearest ED to get his hyperkalemia treated and his AKI worked up thoroughly. He verbalized that he would have his wife drive him to the nearest ED for further workup. Will route message to his cardiologist, Dr. Tenny Craw, so she is aware as well.  Bella Kennedy, MD Cardiology 05/25/2022
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
## 2022-05-26 DIAGNOSIS — D649 Anemia, unspecified: Secondary | ICD-10-CM | POA: Diagnosis not present

## 2022-05-26 DIAGNOSIS — N179 Acute kidney failure, unspecified: Secondary | ICD-10-CM | POA: Diagnosis not present

## 2022-05-26 DIAGNOSIS — E869 Volume depletion, unspecified: Secondary | ICD-10-CM | POA: Diagnosis not present

## 2022-05-26 DIAGNOSIS — I129 Hypertensive chronic kidney disease with stage 1 through stage 4 chronic kidney disease, or unspecified chronic kidney disease: Secondary | ICD-10-CM | POA: Diagnosis not present

## 2022-05-26 DIAGNOSIS — N390 Urinary tract infection, site not specified: Secondary | ICD-10-CM | POA: Diagnosis not present

## 2022-05-26 DIAGNOSIS — E875 Hyperkalemia: Secondary | ICD-10-CM | POA: Diagnosis not present

## 2022-05-26 DIAGNOSIS — N1831 Chronic kidney disease, stage 3a: Secondary | ICD-10-CM | POA: Diagnosis not present

## 2022-05-26 LAB — BASIC METABOLIC PANEL
Anion gap: 6 (ref 5–15)
BUN: 42 mg/dL — ABNORMAL HIGH (ref 8–23)
CO2: 18 mmol/L — ABNORMAL LOW (ref 22–32)
Calcium: 8.2 mg/dL — ABNORMAL LOW (ref 8.9–10.3)
Chloride: 115 mmol/L — ABNORMAL HIGH (ref 98–111)
Creatinine, Ser: 1.92 mg/dL — ABNORMAL HIGH (ref 0.61–1.24)
GFR, Estimated: 37 mL/min — ABNORMAL LOW (ref >60)
Glucose, Bld: 97 mg/dL (ref 70–99)
Potassium: 4.7 mmol/L (ref 3.5–5.1)
Sodium: 139 mmol/L (ref 135–145)

## 2022-05-26 LAB — CBC
HCT: 30.2 % — ABNORMAL LOW (ref 39.0–52.0)
Hemoglobin: 9.2 g/dL — ABNORMAL LOW (ref 13.0–17.0)
MCH: 27.3 pg (ref 26.0–34.0)
MCHC: 30.5 g/dL (ref 30.0–36.0)
MCV: 89.6 fL (ref 80.0–100.0)
Platelets: 128 10*3/uL — ABNORMAL LOW (ref 150–400)
RBC: 3.37 MIL/uL — ABNORMAL LOW (ref 4.22–5.81)
RDW: 14 % (ref 11.5–15.5)
WBC: 3.9 10*3/uL — ABNORMAL LOW (ref 4.0–10.5)
nRBC: 0 % (ref 0.0–0.2)

## 2022-05-26 LAB — URINE CULTURE: Culture: 50000 — AB

## 2022-05-26 LAB — GLUCOSE, CAPILLARY
Glucose-Capillary: 107 mg/dL — ABNORMAL HIGH (ref 70–99)
Glucose-Capillary: 117 mg/dL — ABNORMAL HIGH (ref 70–99)

## 2022-05-26 MED ORDER — FLUCONAZOLE 100 MG PO TABS: 100.0000 mg | ORAL_TABLET | Freq: Every day | ORAL | 0 refills | Status: AC

## 2022-05-26 NOTE — TOC CM/SW Note (Signed)
  Transition of Care [facility]) Screening Note   Patient Details  Name: Lamere Ahlert Date of Birth: 12-28-1950   Transition [facility] Care [facility] CM/SW Contact:    Howell Rucks, RN Phone Number: 05/26/2022, 9:52 AM    Transition of Care Department [facility]) has reviewed patient and no TOC needs have been identified at this time. We will continue to monitor patient advancement through interdisciplinary progression rounds. If new patient transition needs arise, please place a TOC consult.

## 2022-05-26 NOTE — Progress Notes (Signed)
[facility] Progress Note  Subjective: no /co' today  Vitals:   05/25/22 2054 05/26/22 0433 05/26/22 0734 05/26/22 1201  BP: 127/61 112/67  (!) 112/57  Pulse: 65 66  60  Resp: 16 19  18   Temp: 98.7 F (37.1 C) 98.2 F (36.8 C)  98.7 F (37.1 C)  TempSrc: Oral Oral  Oral  SpO2: 98% 98%  99%  Weight:   88.5 kg   Height:        Exam: Gen alert, no distress No rash, cyanosis or gangrene Sclera anicteric, throat clear  No jvd or bruits, neck veins are flat Chest clear bilat to bases, no rales/ wheezing RRR no MRG Abd soft ntnd no mass or ascites +bs GU normal male MS no joint effusions or deformity Ext no LE or UE edema, no wounds or ulcers Neuro is alert, Ox 3 , nf        Home meds include - norvasc 10, coreg 6.25 bid, dapagliflozin 10mg  qam, crestor, semaglutide, asa, tegretol, catapres patch 0.3 q 7d, plavix, olmesartan 40 qd, protonix , prns / vits/ supps       Date                           Creat               eGFR  2011- 2022                 0.78- 1.14          May- sept 2023          0.98- 1.08  Nov-dec 2023             0.80- 1.38  Jan 2024                    1.21 Feb 2022                   1.26   05/24/22                      2.78   05/25/22                      3.05          B/l creat = 0.80- 1.26, eGFR   57- > 60 ml/min, CKD 3a from dec 2023- feb 2024       UA turbid, large Hb, glu > 500, large LE, 100 prot, few bact, >50 rbc/ wbc's      UNa 70, Ucr 94        VS today --> 118/70, HR 70s, RR 10-18, afeb     Na 135  K + 4.9  CO219  BUN 60  creat 3.05   Ca 8.6  alb 3.7   LFT's okay  eGFR 21 ml/min   WBC 4K           Assessment/ Plan: AKI on CKD 3a -  b/l creat = 0.80- 1.26, eGFR   57- > 60 ml/min, from dec 2023- feb 2024. Creat here 2.8 on admission, sent to ED for abnormal OP labs Andres Labrum). Got 2 L bolus in ED and IVF"s overnight. Creat up to 3.0 at time on consult. Was taking ARB and SGLT2 inhibitor, both were held. BP's were soft  80s-100s in ED, better now. Making urine. Renal US showed no hydro, UA  was consistent w/ UTI.  Is getting IV abx. Suspect AKI likely due to vol depletion+ ARB effects. We re-bolused another 1 L and cont'd IVF's. Today creat is down to 1.9, sig better. OK for dc. Would cont to hold diuretics and ARB until seen by PCP for labs in 1-2 weeks. No need for renal f/u right now. If needed can be referred in the OP setting. Will sign off.  Acute UTI - getting IV abx Anemia - normocytic HTN - bp's soft to normal, will hold catapres patch for now, let BP's come up a bit.  DM2 - ok to resume [place] when renal function better CAD sp CABG           Andrew Moselle MD [facility] 05/26/2022, 3:34 PM  Recent Labs  Lab 05/25/22 0436 05/25/22 0437 05/25/22 1808 05/26/22 0417  HGB  --  10.1*  --  9.2*  ALBUMIN 3.7  --   --   --   CALCIUM 8.6*  --  8.4* 8.2*  CREATININE 3.05*  --  2.12* 1.92*  K 4.9  --  4.6 4.7   No results for input(s): "IRON", "TIBC", "FERRITIN" in the last 168 hours. Inpatient medications:  aspirin EC  81 mg Oral Daily   carbamazepine  200 mg Oral Daily   clopidogrel  75 mg Oral Daily   latanoprost  1 drop Both Eyes QHS   pantoprazole  40 mg Oral Daily    cefTRIAXone (ROCEPHIN)  IV Stopped (05/26/22 1240)   acetaminophen **OR** acetaminophen, ondansetron **OR** ondansetron (ZOFRAN) IV
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
## 2022-05-30 ENCOUNTER — Other Ambulatory Visit (INDEPENDENT_AMBULATORY_CARE_PROVIDER_SITE_OTHER): Payer: Medicare Other

## 2022-05-30 ENCOUNTER — Other Ambulatory Visit: Payer: Medicare Other

## 2022-05-30 DIAGNOSIS — E1169 Type 2 diabetes mellitus with other specified complication: Secondary | ICD-10-CM | POA: Diagnosis not present

## 2022-05-30 DIAGNOSIS — E669 Obesity, unspecified: Secondary | ICD-10-CM | POA: Diagnosis not present

## 2022-05-30 DIAGNOSIS — R350 Frequency of micturition: Secondary | ICD-10-CM | POA: Diagnosis not present

## 2022-05-30 DIAGNOSIS — E785 Hyperlipidemia, unspecified: Secondary | ICD-10-CM

## 2022-05-30 DIAGNOSIS — R310 Gross hematuria: Secondary | ICD-10-CM | POA: Diagnosis not present

## 2022-05-30 DIAGNOSIS — R972 Elevated prostate specific antigen [PSA]: Secondary | ICD-10-CM | POA: Diagnosis not present

## 2022-05-30 LAB — BASIC METABOLIC PANEL
BUN: 22 mg/dL (ref 6–23)
CO2: 23 mEq/L (ref 19–32)
Calcium: 9.3 mg/dL (ref 8.4–10.5)
Chloride: 110 mEq/L (ref 96–112)
Creatinine, Ser: 1.71 mg/dL — ABNORMAL HIGH (ref 0.40–1.50)
GFR: 39.89 mL/min — ABNORMAL LOW (ref >60.00)
Glucose, Bld: 143 mg/dL — ABNORMAL HIGH (ref 70–99)
Potassium: 4.5 mEq/L (ref 3.5–5.1)
Sodium: 142 mEq/L (ref 135–145)

## 2022-05-30 LAB — LDL CHOLESTEROL, DIRECT: Direct LDL: 39 mg/dL

## 2022-05-30 LAB — HEMOGLOBIN A1C: Hgb A1c MFr Bld: 7.2 % — ABNORMAL HIGH (ref 4.6–6.5)

## 2022-06-03 ENCOUNTER — Ambulatory Visit (INDEPENDENT_AMBULATORY_CARE_PROVIDER_SITE_OTHER): Payer: Medicare Other | Admitting: Endocrinology

## 2022-06-03 ENCOUNTER — Encounter: Payer: Self-pay | Admitting: Endocrinology

## 2022-06-03 VITALS — BP 122/68 | HR 74 | Ht 74.0 in | Wt 189.0 lb

## 2022-06-03 DIAGNOSIS — E1165 Type 2 diabetes mellitus with hyperglycemia: Secondary | ICD-10-CM | POA: Diagnosis not present

## 2022-06-03 DIAGNOSIS — Z7984 Long term (current) use of oral hypoglycemic drugs: Secondary | ICD-10-CM

## 2022-06-03 DIAGNOSIS — E785 Hyperlipidemia, unspecified: Secondary | ICD-10-CM | POA: Diagnosis not present

## 2022-06-03 MED ORDER — GLUCOSE BLOOD VI STRP: ORAL_STRIP | 12 refills | Status: AC

## 2022-06-03 NOTE — Patient Instructions (Signed)
Rybelsus daily in am  Check blood sugars on waking up 2-3 days a week  Also check blood sugars about 2 hours after meals and do this after different meals by rotation  Recommended blood sugar levels on waking up are 90-130 and about 2 hours after meal is 130-160  Please bring your blood sugar monitor to each visit, thank you
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
## 2022-06-07 NOTE — Patient Instructions (Incomplete)
It was good to see you again today, I will be in touch with your lab work ASAP Assuming your kidney function is continuing to improve we will plan to ge a CT of your chest, abdomen and pelvis, as well as check a TSH and PSA. This work- up is to make sure there is nothing nefarious causing your weight loss

## 2022-06-08 ENCOUNTER — Ambulatory Visit (INDEPENDENT_AMBULATORY_CARE_PROVIDER_SITE_OTHER): Payer: Medicare Other | Admitting: Family Medicine

## 2022-06-08 VITALS — BP 122/80 | HR 68 | Temp 98.2°F | Resp 18 | Ht 72.0 in | Wt 182.4 lb

## 2022-06-08 DIAGNOSIS — Z7984 Long term (current) use of oral hypoglycemic drugs: Secondary | ICD-10-CM

## 2022-06-08 DIAGNOSIS — S2222XG Fracture of body of sternum, subsequent encounter for fracture with delayed healing: Secondary | ICD-10-CM | POA: Diagnosis not present

## 2022-06-08 DIAGNOSIS — R634 Abnormal weight loss: Secondary | ICD-10-CM

## 2022-06-08 DIAGNOSIS — N179 Acute kidney failure, unspecified: Secondary | ICD-10-CM

## 2022-06-08 DIAGNOSIS — R972 Elevated prostate specific antigen [PSA]: Secondary | ICD-10-CM

## 2022-06-08 DIAGNOSIS — E785 Hyperlipidemia, unspecified: Secondary | ICD-10-CM | POA: Diagnosis not present

## 2022-06-08 DIAGNOSIS — I10 Essential (primary) hypertension: Secondary | ICD-10-CM

## 2022-06-08 DIAGNOSIS — E1151 Type 2 diabetes mellitus with diabetic peripheral angiopathy without gangrene: Secondary | ICD-10-CM

## 2022-06-08 DIAGNOSIS — I2510 Atherosclerotic heart disease of native coronary artery without angina pectoris: Secondary | ICD-10-CM | POA: Diagnosis not present

## 2022-06-08 LAB — COMPREHENSIVE METABOLIC PANEL
ALT: 17 U/L (ref 0–53)
AST: 15 U/L (ref 0–37)
Albumin: 4.1 g/dL (ref 3.5–5.2)
Alkaline Phosphatase: 127 U/L — ABNORMAL HIGH (ref 39–117)
BUN: 26 mg/dL — ABNORMAL HIGH (ref 6–23)
CO2: 26 mEq/L (ref 19–32)
Calcium: 9.3 mg/dL (ref 8.4–10.5)
Chloride: 107 mEq/L (ref 96–112)
Creatinine, Ser: 1.78 mg/dL — ABNORMAL HIGH (ref 0.40–1.50)
GFR: 38.01 mL/min — ABNORMAL LOW (ref >60.00)
Glucose, Bld: 138 mg/dL — ABNORMAL HIGH (ref 70–99)
Potassium: 4.6 mEq/L (ref 3.5–5.1)
Sodium: 141 mEq/L (ref 135–145)
Total Bilirubin: 0.3 mg/dL (ref 0.2–1.2)
Total Protein: 7.8 g/dL (ref 6.0–8.3)

## 2022-06-08 LAB — CBC
HCT: 35.5 % — ABNORMAL LOW (ref 39.0–52.0)
Hemoglobin: 11.2 g/dL — ABNORMAL LOW (ref 13.0–17.0)
MCHC: 31.4 g/dL (ref 30.0–36.0)
MCV: 86.6 fl (ref 78.0–100.0)
Platelets: 217 10*3/uL (ref 150.0–400.0)
RBC: 4.11 Mil/uL — ABNORMAL LOW (ref 4.22–5.81)
RDW: 14.2 % (ref 11.5–15.5)
WBC: 4.8 10*3/uL (ref 4.0–10.5)

## 2022-06-08 LAB — TSH: TSH: 1.8 u[IU]/mL (ref 0.35–5.50)

## 2022-06-08 LAB — PSA: PSA: 7.34 ng/mL — ABNORMAL HIGH (ref 0.10–4.00)

## 2022-06-08 MED ORDER — ROSUVASTATIN CALCIUM 40 MG PO TABS: 40.0000 mg | ORAL_TABLET | Freq: Every day | ORAL | 3 refills | Status: DC

## 2022-06-10 NOTE — Addendum Note (Signed)
Addended by: Abbe Amsterdam C on: 06/10/2022 05:42 PM   Modules accepted: Orders

## 2022-06-24 ENCOUNTER — Other Ambulatory Visit: Payer: Self-pay | Admitting: Neurology

## 2022-06-27 ENCOUNTER — Ambulatory Visit (HOSPITAL_COMMUNITY)
Admission: RE | Admit: 2022-06-27 | Discharge: 2022-06-27 | Disposition: A | Discharge status: Home / Self Care | Payer: Medicare Other | Source: Ambulatory Visit | Attending: Family Medicine | Admitting: Family Medicine

## 2022-06-27 DIAGNOSIS — I2510 Atherosclerotic heart disease of native coronary artery without angina pectoris: Secondary | ICD-10-CM | POA: Insufficient documentation

## 2022-06-27 DIAGNOSIS — R634 Abnormal weight loss: Secondary | ICD-10-CM | POA: Insufficient documentation

## 2022-06-27 DIAGNOSIS — N281 Cyst of kidney, acquired: Secondary | ICD-10-CM | POA: Diagnosis not present

## 2022-06-27 DIAGNOSIS — S2222XG Fracture of body of sternum, subsequent encounter for fracture with delayed healing: Secondary | ICD-10-CM | POA: Insufficient documentation

## 2022-06-27 DIAGNOSIS — N179 Acute kidney failure, unspecified: Secondary | ICD-10-CM | POA: Insufficient documentation

## 2022-06-27 DIAGNOSIS — K5730 Diverticulosis of large intestine without perforation or abscess without bleeding: Secondary | ICD-10-CM | POA: Diagnosis not present

## 2022-06-27 DIAGNOSIS — S2220XA Unspecified fracture of sternum, initial encounter for closed fracture: Secondary | ICD-10-CM | POA: Diagnosis not present

## 2022-06-27 LAB — POCT I-STAT CREATININE: Creatinine, Ser: 1.7 mg/dL — ABNORMAL HIGH (ref 0.61–1.24)

## 2022-06-27 MED ORDER — IOHEXOL 300 MG/ML  SOLN
100.0000 mL | Freq: Once | INTRAMUSCULAR | Status: AC | PRN
  Administered 2022-06-27: 100 mL via INTRAVENOUS

## 2022-06-28 ENCOUNTER — Other Ambulatory Visit: Payer: Self-pay

## 2022-06-28 MED ORDER — CARBAMAZEPINE 200 MG PO TABS: 200.0000 mg | ORAL_TABLET | Freq: Every day | ORAL | 0 refills | Status: DC

## 2022-07-01 ENCOUNTER — Telehealth (INDEPENDENT_AMBULATORY_CARE_PROVIDER_SITE_OTHER): Payer: Medicare Other | Admitting: Family Medicine

## 2022-07-01 ENCOUNTER — Encounter: Payer: Self-pay | Admitting: Family Medicine

## 2022-07-01 VITALS — Temp 98.9°F | Ht 72.0 in | Wt 182.0 lb

## 2022-07-01 DIAGNOSIS — U071 COVID-19: Secondary | ICD-10-CM | POA: Diagnosis not present

## 2022-07-01 MED ORDER — MOLNUPIRAVIR EUA 200MG CAPSULE
4.0000 | ORAL_CAPSULE | Freq: Two times a day (BID) | ORAL | 0 refills | Status: AC
Start: 2022-07-01 — End: 2022-07-06

## 2022-07-01 NOTE — Progress Notes (Signed)
Wife - positive Covid  Home test last night - positive Tickling in throat no other symptoms

## 2022-07-01 NOTE — Progress Notes (Signed)
Virtual Visit via Telephone Note  I connected with  Andrew [name] on 07/01/22 at 11:00 AM EDT by telephone and verified that I am speaking with the correct person using two identifiers.   I discussed the limitations, risks, security and privacy concerns of performing an evaluation and management service by telephone and the availability of in person appointments. I also discussed with the patient that there may be a patient responsible charge related to this service. The patient expressed understanding and agreed to proceed.  Participating parties included in this telephone visit include: The patient and the nurse practitioner listed.  The patient is: At home I am: at [facility]   Subjective:    CC: COVID +  HPI: Andrew [name] is a 72 y.o. year old male presenting today via telephone visit to discuss COVID.   Patient reports his wife has been feeling poorly for about a week and tested positive for COVID yesterday. He started having an itchy throat yesterday so he tested and was positive as well. Reports he is feeling okay overall, but given his health history he wanted to speak with [place] and be proactive. He started taking vitamins, elderberry, etc. Patient denies any chest pain, palpitations, dyspnea, wheezing, edema, recurrent headaches, vision changes.    Past medical history, Surgical history, Family history not pertinant except as noted below, Social history, Allergies, and medications have been entered into the medical record, reviewed, and corrections made.   Review of Systems:  All review of systems negative except what is listed in the HPI  Objective:    General:  Patient speaking clearly in complete sentences. No shortness of breath noted.   Alert and oriented x3.   Normal judgment.  No apparent acute distress.  Impression and Recommendations:    1. COVID-19 - molnupiravir EUA (LAGEVRIO) 200 mg CAPS capsule; Take 4 capsules  (800 mg total) by mouth 2 (two) times daily for 5 days.  Dispense: 40 capsule; Refill: 0  Discussed options with patient. He would like to have the molnupiravir available in case he starts feeling worse in the next day or so - aware that it needs to be taken in first 5 days of symptoms to be most effective. Aware that Paxlovid is contraindicated due to medication interactions. Discussed supportive measures and symptoms to monitor for. Overall stable right now. Follow-up if needed.       I discussed the assessment and treatment plan with the patient. The patient was provided an opportunity to ask questions and all were answered. The patient agreed with the plan and demonstrated an understanding of the instructions.   The patient was advised to call back or seek an in-person evaluation if the symptoms worsen or if the condition fails to improve as anticipated.  I provided 20 minutes of non-face-to-face time during this TELEPHONE encounter.    Clayborne Dana, NP

## 2022-07-06 ENCOUNTER — Telehealth: Payer: Self-pay | Admitting: Family Medicine

## 2022-07-06 NOTE — Telephone Encounter (Signed)
I called pt yesterday (left a message on his machine over the weekend) but connected with him yesterday Went over results together.  No concerning findings to explain weight loss. We suspect weight loss is likely due to GLP-1 medication use However, I did want to make sure all is well from a urologic standpoint.  Patient does see Dr. Marlou Porch at [facility] urology; I think all is up-to-date as far as a cystoscopy, etc. but will send report to make sure we are covered  Called [facility]- pt indeed sees Dr Rickey Barbara done in April  I will fax over copy of recent CT report  [phone] attn Dr Herrick/ Morrie Sheldon  CT CHEST, ABDOMEN, AND PELVIS WITH CONTRAST 06/27/2022   TECHNIQUE: Multidetector CT imaging of the chest, abdomen and pelvis was performed following the standard protocol during bolus administration of intravenous contrast.   RADIATION DOSE REDUCTION: This exam was performed according to the departmental dose-optimization program which includes automated exposure control, adjustment of the mA and/or kV according to patient size and/or use of iterative reconstruction technique.   CONTRAST:  OMNIPAQUE IOHEXOL 300 MG/ML  SOLN   COMPARISON:  Multiple priors including CT March 08, 2022   FINDINGS: CT CHEST FINDINGS   Cardiovascular: Aortic atherosclerosis. Normal caliber thoracic aorta. No central pulmonary embolus on this nondedicated study. Coronary artery calcifications. Prior median sternotomy and CABG. Normal size heart. No significant pericardial effusion/thickening.   Mediastinum/Nodes: No suspicious thyroid nodule. No pathologically enlarged mediastinal, hilar or axillary lymph nodes. The esophagus is grossly unremarkable.   Lungs/Pleura: Calcified granuloma in the left upper lobe. No suspicious pulmonary nodules or masses. No pleural effusion. No pneumothorax.   Musculoskeletal: Prior median sternotomy. No aggressive lytic or blastic lesion of bone.   CT  ABDOMEN PELVIS FINDINGS   Hepatobiliary: No suspicious hepatic lesion. Gallbladder is unremarkable. No biliary ductal dilation.   Pancreas: No pancreatic ductal dilation or evidence of acute inflammation.   Spleen: No splenomegaly or focal splenic lesion.   Adrenals/Urinary Tract: Subcentimeter right adrenal nodule on image 55/2 is statistically likely to reflect a benign adenoma and requires no independent imaging follow-up. Left adrenal gland appears normal.   No hydronephrosis. 18 mm right renal cyst is considered benign requiring no independent imaging follow-up. Additional hypodense bilateral renal lesions are technically too small to accurately characterize but statistically likely to reflect cysts and require no independent imaging follow-up. Kidneys demonstrate symmetric enhancement and excretion of contrast material.   Diffuse irregular thickening of the urinary bladder wall.   Stomach/Bowel: Stomach is minimally distended limiting evaluation. No pathologic dilation of small or large bowel. No evidence of acute bowel inflammation. Scattered left-sided colonic diverticulosis without findings of acute diverticulitis.   Vascular/Lymphatic: Aortic atherosclerosis. Normal caliber abdominal aorta. Portal, splenic and superior mesenteric veins are patent. No pathologically enlarged abdominal or pelvic lymph nodes.   Reproductive: Enlarged prostate gland.   Other: No significant abdominopelvic free fluid.   Musculoskeletal: No aggressive lytic or blastic lesion of bone.   IMPRESSION: 1. Diffuse irregular thickening of the urinary bladder wall, nonspecific but can be seen in the setting of cystitis, chronic bladder outlet obstruction or bladder malignancy. Correlate with urinalysis and suggest urology consult. 2. Enlarged prostate gland. 3. Scattered left-sided colonic diverticulosis without findings of acute diverticulitis. 4. No evidence of acute cardiopulmonary  disease or malignancy in the chest. 5.  Aortic Atherosclerosis (ICD10-I70.0).

## 2022-07-18 DIAGNOSIS — N3021 Other chronic cystitis with hematuria: Secondary | ICD-10-CM | POA: Diagnosis not present

## 2022-07-18 DIAGNOSIS — R972 Elevated prostate specific antigen [PSA]: Secondary | ICD-10-CM | POA: Diagnosis not present

## 2022-07-24 ENCOUNTER — Other Ambulatory Visit: Payer: Self-pay | Admitting: Neurology

## 2022-08-23 ENCOUNTER — Other Ambulatory Visit: Payer: Self-pay | Admitting: Cardiothoracic Surgery

## 2022-08-25 ENCOUNTER — Other Ambulatory Visit (INDEPENDENT_AMBULATORY_CARE_PROVIDER_SITE_OTHER): Payer: Medicare Other

## 2022-08-25 DIAGNOSIS — E1165 Type 2 diabetes mellitus with hyperglycemia: Secondary | ICD-10-CM | POA: Diagnosis not present

## 2022-08-25 LAB — BASIC METABOLIC PANEL
BUN: 15 mg/dL (ref 6–23)
CO2: 27 mEq/L (ref 19–32)
Calcium: 8.8 mg/dL (ref 8.4–10.5)
Chloride: 109 mEq/L (ref 96–112)
Creatinine, Ser: 1.26 mg/dL (ref 0.40–1.50)
GFR: 57.45 mL/min — ABNORMAL LOW (ref >60.00)
Glucose, Bld: 116 mg/dL — ABNORMAL HIGH (ref 70–99)
Potassium: 3.5 mEq/L (ref 3.5–5.1)
Sodium: 138 mEq/L (ref 135–145)

## 2022-08-25 LAB — HEMOGLOBIN A1C: Hgb A1c MFr Bld: 6.2 % (ref 4.6–6.5)

## 2022-08-26 LAB — FRUCTOSAMINE: Fructosamine: 303 umol/L — ABNORMAL HIGH (ref 0–285)

## 2022-09-01 ENCOUNTER — Encounter: Payer: Self-pay | Admitting: Endocrinology

## 2022-09-01 ENCOUNTER — Ambulatory Visit (INDEPENDENT_AMBULATORY_CARE_PROVIDER_SITE_OTHER): Payer: Medicare Other | Admitting: Endocrinology

## 2022-09-01 VITALS — BP 140/84 | HR 61 | Ht 72.0 in | Wt 199.4 lb

## 2022-09-01 DIAGNOSIS — E118 Type 2 diabetes mellitus with unspecified complications: Secondary | ICD-10-CM | POA: Diagnosis not present

## 2022-09-01 DIAGNOSIS — Z7984 Long term (current) use of oral hypoglycemic drugs: Secondary | ICD-10-CM

## 2022-09-01 MED ORDER — BLOOD GLUCOSE TEST VI STRP: 1.0000 | ORAL_STRIP | Freq: Three times a day (TID) | 0 refills | Status: AC

## 2022-09-01 MED ORDER — LANCETS MISC. MISC: 1.0000 | Freq: Three times a day (TID) | 0 refills | Status: AC

## 2022-09-01 MED ORDER — BLOOD GLUCOSE MONITORING SUPPL DEVI: 1.0000 | Freq: Three times a day (TID) | 0 refills | Status: AC

## 2022-09-01 MED ORDER — DAPAGLIFLOZIN PROPANEDIOL 5 MG PO TABS
5.0000 mg | ORAL_TABLET | Freq: Every day | ORAL | 3 refills | Status: DC
Start: 2022-09-01 — End: 2022-12-05

## 2022-09-01 MED ORDER — LANCET DEVICE MISC: 1.0000 | Freq: Three times a day (TID) | 0 refills | Status: AC
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
## 2022-09-01 NOTE — Patient Instructions (Addendum)
Glucometer prescription sent to your pharmacy.  Check glucose 2-3 times a day, fasting and at bedtime.  Continue rybelsus 7 mg daily.  Restart farxiga 5mg  daily.  Lab in 2 weeks.
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
## 2022-09-14 ENCOUNTER — Encounter: Payer: Self-pay | Admitting: Family Medicine

## 2022-09-14 ENCOUNTER — Ambulatory Visit: Payer: Medicare Other | Admitting: Family Medicine

## 2022-09-14 VITALS — BP 140/80 | HR 63 | Temp 98.3°F | Resp 18 | Ht 72.0 in | Wt 189.4 lb

## 2022-09-14 DIAGNOSIS — I2510 Atherosclerotic heart disease of native coronary artery without angina pectoris: Secondary | ICD-10-CM

## 2022-09-14 DIAGNOSIS — M25512 Pain in left shoulder: Secondary | ICD-10-CM | POA: Diagnosis not present

## 2022-09-14 DIAGNOSIS — I10 Essential (primary) hypertension: Secondary | ICD-10-CM | POA: Diagnosis not present

## 2022-09-14 DIAGNOSIS — Z23 Encounter for immunization: Secondary | ICD-10-CM

## 2022-09-14 DIAGNOSIS — M4802 Spinal stenosis, cervical region: Secondary | ICD-10-CM

## 2022-09-14 DIAGNOSIS — N179 Acute kidney failure, unspecified: Secondary | ICD-10-CM | POA: Diagnosis not present

## 2022-09-14 DIAGNOSIS — E1151 Type 2 diabetes mellitus with diabetic peripheral angiopathy without gangrene: Secondary | ICD-10-CM | POA: Diagnosis not present

## 2022-09-14 DIAGNOSIS — M25511 Pain in right shoulder: Secondary | ICD-10-CM

## 2022-09-14 DIAGNOSIS — G8929 Other chronic pain: Secondary | ICD-10-CM | POA: Diagnosis not present

## 2022-09-14 LAB — MICROALBUMIN / CREATININE URINE RATIO
Creatinine,U: 83.4 mg/dL
Microalb Creat Ratio: 2.2 mg/g (ref 0.0–30.0)
Microalb, Ur: 1.8 mg/dL (ref 0.0–1.9)

## 2022-09-14 NOTE — Patient Instructions (Addendum)
It was great to see you again today, Best of luck with your golf tournament  As we mention, your kidney function looks quite a bit better when your labs were done in August  For joint pain, I would recommend using Tylenol as directed on the medication package.  I think it also would be reasonable for you to use an Aleve on occasion as needed, a couple of times a week should be okay.  Avoid using Aleve all the time however, because it is hard on your kidneys  Your blood pressure was mildly elevated here at the clinic today.  If you would, please keep an eye on your blood pressure at home or at the drugstore.  If you find you are consistently running higher than 135/85 please alert me  Flu shot today  Recommend COVID booster this fall  Recommend shingles vaccine if not done already, and tetanus booste rat your pharmacy   Assuming all is well, please see me in 4 to 6 months

## 2022-09-15 ENCOUNTER — Other Ambulatory Visit (INDEPENDENT_AMBULATORY_CARE_PROVIDER_SITE_OTHER): Payer: Medicare Other

## 2022-09-15 DIAGNOSIS — E118 Type 2 diabetes mellitus with unspecified complications: Secondary | ICD-10-CM | POA: Diagnosis not present

## 2022-09-15 LAB — BASIC METABOLIC PANEL
BUN: 15 mg/dL (ref 6–23)
CO2: 27 meq/L (ref 19–32)
Calcium: 9 mg/dL (ref 8.4–10.5)
Chloride: 108 meq/L (ref 96–112)
Creatinine, Ser: 1.32 mg/dL (ref 0.40–1.50)
GFR: 54.31 mL/min — ABNORMAL LOW (ref >60.00)
Glucose, Bld: 99 mg/dL (ref 70–99)
Potassium: 3.8 meq/L (ref 3.5–5.1)
Sodium: 142 meq/L (ref 135–145)

## 2022-09-22 ENCOUNTER — Other Ambulatory Visit: Payer: Self-pay | Admitting: Internal Medicine

## 2022-09-23 ENCOUNTER — Encounter: Payer: Self-pay | Admitting: Family Medicine

## 2022-09-26 DIAGNOSIS — H2512 Age-related nuclear cataract, left eye: Secondary | ICD-10-CM | POA: Diagnosis not present

## 2022-09-26 DIAGNOSIS — E119 Type 2 diabetes mellitus without complications: Secondary | ICD-10-CM | POA: Diagnosis not present

## 2022-09-26 DIAGNOSIS — H40032 Anatomical narrow angle, left eye: Secondary | ICD-10-CM | POA: Diagnosis not present

## 2022-09-26 DIAGNOSIS — H401121 Primary open-angle glaucoma, left eye, mild stage: Secondary | ICD-10-CM | POA: Diagnosis not present

## 2022-09-26 LAB — HM DIABETES EYE EXAM

## 2022-09-30 ENCOUNTER — Other Ambulatory Visit: Payer: Self-pay | Admitting: Neurology

## 2022-10-03 ENCOUNTER — Other Ambulatory Visit: Payer: Self-pay

## 2022-10-03 MED ORDER — CARBAMAZEPINE 200 MG PO TABS: 200.0000 mg | ORAL_TABLET | Freq: Every day | ORAL | 0 refills | Status: DC

## 2022-10-04 DIAGNOSIS — R972 Elevated prostate specific antigen [PSA]: Secondary | ICD-10-CM | POA: Diagnosis not present

## 2022-10-16 ENCOUNTER — Other Ambulatory Visit: Payer: Self-pay | Admitting: Neurology

## 2022-10-17 ENCOUNTER — Other Ambulatory Visit: Payer: Self-pay | Admitting: Family Medicine

## 2022-10-17 ENCOUNTER — Other Ambulatory Visit: Payer: Self-pay | Admitting: Neurology

## 2022-10-21 DIAGNOSIS — N401 Enlarged prostate with lower urinary tract symptoms: Secondary | ICD-10-CM | POA: Diagnosis not present

## 2022-10-21 DIAGNOSIS — R350 Frequency of micturition: Secondary | ICD-10-CM | POA: Diagnosis not present

## 2022-10-21 DIAGNOSIS — R972 Elevated prostate specific antigen [PSA]: Secondary | ICD-10-CM | POA: Diagnosis not present

## 2022-10-21 DIAGNOSIS — R351 Nocturia: Secondary | ICD-10-CM | POA: Diagnosis not present

## 2022-10-22 ENCOUNTER — Other Ambulatory Visit: Payer: Self-pay | Admitting: Cardiothoracic Surgery

## 2022-10-22 ENCOUNTER — Other Ambulatory Visit: Payer: Self-pay | Admitting: Family Medicine

## 2022-10-24 ENCOUNTER — Other Ambulatory Visit: Payer: Self-pay | Admitting: Family Medicine

## 2022-10-24 ENCOUNTER — Other Ambulatory Visit: Payer: Self-pay

## 2022-10-24 MED ORDER — CARBAMAZEPINE 200 MG PO TABS: 200.0000 mg | ORAL_TABLET | Freq: Every day | ORAL | 0 refills | Status: DC
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
## 2022-10-25 ENCOUNTER — Ambulatory Visit: Payer: Medicare Other | Attending: Internal Medicine | Admitting: Internal Medicine

## 2022-10-25 ENCOUNTER — Encounter: Payer: Self-pay | Admitting: Internal Medicine

## 2022-10-25 VITALS — BP 130/90 | HR 54 | Ht 72.0 in | Wt 201.0 lb

## 2022-10-25 DIAGNOSIS — I2510 Atherosclerotic heart disease of native coronary artery without angina pectoris: Secondary | ICD-10-CM | POA: Diagnosis not present

## 2022-10-25 NOTE — Patient Instructions (Signed)
Medication Instructions:   *If you need a refill on your cardiac medications before your next appointment, please call your pharmacy*   Lab Work:  If you have labs (blood work) drawn today and your tests are completely normal, you will receive your results only by: [place] (if you have MyChart) OR A paper copy in the mail If you have any lab test that is abnormal or we need to change your treatment, we will call you to review the results.   Testing/Procedures:    Follow-Up: At [facility], you and your health needs are our priority.  As part of our continuing mission to provide you with exceptional heart care, we have created designated Provider Care Teams.  These Care Teams include your primary Cardiologist (physician) and Advanced Practice Providers (APPs -  Physician Assistants and Nurse Practitioners) who all work together to provide you with the care you need, when you need it.  We recommend signing up for the patient portal called "MyChart".  Sign up information is provided on this After Visit Summary.  MyChart is used to connect with patients for Virtual Visits (Telemedicine).  Patients are able to view lab/test results, encounter notes, upcoming appointments, etc.  Non-urgent messages can be sent to your provider as well.   To learn more about what you can do with MyChart, go to [email or web address].    Your next appointment:   6 month(s)  Provider:   Dorris Carnes, MD     Other Instructions

## 2022-10-26 ENCOUNTER — Telehealth: Payer: Self-pay | Admitting: Adult Health

## 2022-10-26 MED ORDER — CARBAMAZEPINE 200 MG PO TABS: 200.0000 mg | ORAL_TABLET | Freq: Every day | ORAL | 0 refills | Status: DC

## 2022-10-26 NOTE — Telephone Encounter (Signed)
Pt has scheduled his needed 1 yr f/u and is on wait list, pt states he is down to his last 7 pills for carbamazepine (TEGRETOL) 200 MG tablet  , he is requesting a refill to [facility] [phone]

## 2022-10-26 NOTE — Telephone Encounter (Signed)
Refill sent to pharmacy for 60 tabs

## 2022-12-05 ENCOUNTER — Encounter: Payer: Self-pay | Admitting: Endocrinology

## 2022-12-05 ENCOUNTER — Ambulatory Visit (INDEPENDENT_AMBULATORY_CARE_PROVIDER_SITE_OTHER): Payer: Medicare Other | Admitting: Endocrinology

## 2022-12-05 VITALS — BP 118/60 | HR 61 | Resp 20 | Ht 72.0 in | Wt 202.4 lb

## 2022-12-05 DIAGNOSIS — E118 Type 2 diabetes mellitus with unspecified complications: Secondary | ICD-10-CM

## 2022-12-05 DIAGNOSIS — Z7984 Long term (current) use of oral hypoglycemic drugs: Secondary | ICD-10-CM | POA: Diagnosis not present

## 2022-12-05 LAB — POCT GLYCOSYLATED HEMOGLOBIN (HGB A1C): Hemoglobin A1C: 6.4 % — AB (ref 4.0–5.6)

## 2022-12-05 MED ORDER — DAPAGLIFLOZIN PROPANEDIOL 5 MG PO TABS: 5.0000 mg | ORAL_TABLET | Freq: Every day | ORAL | 4 refills | Status: DC
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
## 2022-12-06 ENCOUNTER — Other Ambulatory Visit: Payer: Self-pay

## 2022-12-20 ENCOUNTER — Other Ambulatory Visit: Payer: Self-pay | Admitting: Family Medicine

## 2022-12-20 DIAGNOSIS — E785 Hyperlipidemia, unspecified: Secondary | ICD-10-CM

## 2022-12-22 ENCOUNTER — Ambulatory Visit: Payer: Medicare Other | Admitting: Neurology

## 2022-12-22 ENCOUNTER — Telehealth: Payer: Self-pay | Admitting: Adult Health

## 2022-12-22 ENCOUNTER — Other Ambulatory Visit: Payer: Self-pay

## 2022-12-22 ENCOUNTER — Encounter: Payer: Self-pay | Admitting: Neurology

## 2022-12-22 VITALS — BP 156/73 | HR 58 | Ht 72.0 in | Wt 201.0 lb

## 2022-12-22 DIAGNOSIS — G4752 REM sleep behavior disorder: Secondary | ICD-10-CM

## 2022-12-22 DIAGNOSIS — G514 Facial myokymia: Secondary | ICD-10-CM

## 2022-12-22 DIAGNOSIS — R200 Anesthesia of skin: Secondary | ICD-10-CM | POA: Diagnosis not present

## 2022-12-22 DIAGNOSIS — F518 Other sleep disorders not due to a substance or known physiological condition: Secondary | ICD-10-CM | POA: Insufficient documentation

## 2022-12-22 DIAGNOSIS — G4763 Sleep related bruxism: Secondary | ICD-10-CM

## 2022-12-22 DIAGNOSIS — R682 Dry mouth, unspecified: Secondary | ICD-10-CM

## 2022-12-22 MED ORDER — CARBAMAZEPINE 200 MG PO TABS: 200.0000 mg | ORAL_TABLET | Freq: Every day | ORAL | 3 refills | Status: DC

## 2022-12-22 MED ORDER — CLOPIDOGREL BISULFATE 75 MG PO TABS: 75.0000 mg | ORAL_TABLET | Freq: Every day | ORAL | 1 refills | Status: DC

## 2022-12-22 NOTE — Patient Instructions (Signed)
RV in 12 months Carbamazepine Tablets What is this medication? CARBAMAZEPINE (kar ba MAZ e peen) prevents and controls seizures in people with epilepsy. It may also be used to treat nerve pain. It works by calming overactive nerves in your body. This medicine may be used for other purposes; ask your health care provider or pharmacist if you have questions. COMMON BRAND NAME(S): Epitol, Tegretol What should I tell my care team before I take this medication? They need to know if you have any of these conditions: Asian ancestry Bone marrow disease Glaucoma Heart disease Irregular heartbeat or rhythm Kidney disease Liver disease Low blood cell levels (white cells, red cells, or platelets) Mental health conditions Porphyria Suicidal thoughts, plans, or attempt by you or a family member An unusual or allergic reaction to carbamazepine, other medications, foods, dyes, or preservatives Pregnant or trying to get pregnant Breastfeeding How should I use this medication? Take this medication by mouth with a glass of water. Follow the directions on the prescription label. Take this medication with food. Take your doses at regular intervals. Do not take your medication more often than directed. Do not stop taking this medication except on the advice of your care team. A special MedGuide will be given to you by the pharmacist with each prescription and refill. Be sure to read this information carefully each time. Talk to your care team about the use of this medication in children. Special care may be needed. Overdosage: If you think you have taken too much of this medicine contact a poison control center or emergency room at once. NOTE: This medicine is only for you. Do not share this medicine with others. What if I miss a dose? If you miss a dose, take it as soon as you can. If it is almost time for your next dose, take only that dose. Do not take double or extra doses. What may interact with this  medication? Do not take this medication with any of the following: Certain medications used to treat HIV infection or AIDS that are given in combination with cobicistat Delavirdine MAOIs like Carbex, Eldepryl, Marplan, Nardil, and Parnate Nefazodone Oxcarbazepine This medication may also interact with the following: Acetaminophen Acetazolamide Barbiturate medications for inducing sleep or treating seizures, like phenobarbital Certain antibiotics like clarithromycin, erythromycin or troleandomycin Cimetidine Cyclosporine Danazol Dicumarol Doxycycline Male hormones, including estrogens and birth control pills Grapefruit juice Isoniazid, INH Levothyroxine and other thyroid hormones Lithium and other medications to treat mood problems or psychotic disturbances Loratadine Medications for angina or high blood pressure Medications for cancer Medications for depression or anxiety Medications for sleep Medications to treat fungal infections, like fluconazole, itraconazole or ketoconazole Medications used to treat HIV infection or AIDS Methadone Niacinamide Praziquantel Propoxyphene Rifampin or rifabutin Seizure or epilepsy medication Steroid medications such as prednisone or cortisone Theophylline Tramadol Warfarin This list may not describe all possible interactions. Give your health care provider a list of all the medicines, herbs, non-prescription drugs, or dietary supplements you use. Also tell them if you smoke, drink alcohol, or use illegal drugs. Some items may interact with your medicine. What should I watch for while using this medication? Visit your care team for regular checks on your progress. Do not change brands or dosage forms of this medication without discussing it with your care team. If you are taking this medication for epilepsy (seizures), do not stop taking it suddenly. This increases the risk of seizures. Wear a Arboriculturist or necklace. Carry an  identification  card with information about your condition, medications, and care team. This medication may cause serious skin reactions. They can happen weeks to months after starting the medication. Contact your care team right away if you notice fevers or flu-like symptoms with a rash. The rash may be red or purple and then turn into blisters or peeling of the skin. You may also notice a red rash with swelling of the face, lips, or lymph nodes in your neck or under your arms. This medication may affect your coordination, reaction time, or judgment. Do not drive or operate machinery until you know how this medication affects you. Sit up or stand slowly to reduce the risk of dizzy or fainting spells. Drinking alcohol with this medication can increase the risk of these side effects. Estrogen and progestin hormones may not work as well while you are taking this medication. A barrier contraceptive, such as a condom or diaphragm, is recommended if you are using these hormones for contraception. Talk to your care team about effective forms of contraception. This medication can make you more sensitive to the sun. Keep out of the sun. If you cannot avoid being in the sun, wear protective clothing and sunscreen. Do not use sun lamps, tanning beds, or tanning booths. This medication may cause thoughts of suicide or depression. This includes sudden changes in mood, behaviors, or thoughts. These changes can happen at any time but are more common in the beginning of treatment or after a change in dose. Call your care team right away if you experience these thoughts or worsening depression. Women who become pregnant while using this medication may enroll in the [place] [employer] Antiepileptic Drug Pregnancy Registry by calling [phone]. This registry collects information about the safety of antiepileptic medication use during pregnancy. This medication may cause a decrease in vitamin D and folic acid. You should make  sure that you get enough vitamins while you are taking this medication. Discuss the foods you eat and the vitamins you take with your care team. What side effects may I notice from receiving this medication? Side effects that you should report to your care team as soon as possible: Allergic reactions--skin rash, itching, hives, swelling of the face, lips, tongue, or throat Aplastic anemia--unusual weakness or fatigue, dizziness, headache, trouble breathing, increased bleeding or bruising Change in vision Heart rhythm changes--fast or irregular heartbeat, dizziness, feeling faint or lightheaded, chest pain, trouble breathing Infection--fever, chills, cough, or sore throat Liver injury--right upper belly pain, loss of appetite, nausea, light-colored stool, dark yellow or brown urine, yellowing skin or eyes, unusual weakness or fatigue Low sodium level--muscle weakness, fatigue, dizziness, headache, confusion Rash, fever, and swollen lymph nodes Redness, blistering, peeling or loosening of the skin, including inside the mouth Thoughts of suicide or self-harm, worsening mood, feelings of depression Side effects that usually do not require medical attention (report to your care team if they continue or are bothersome): Dizziness Drowsiness Loss of balance or coordination Nausea Vomiting This list may not describe all possible side effects. Call your doctor for medical advice about side effects. You may report side effects to FDA at 1-800-FDA-1088. Where should I keep my medication? Keep out of reach of children. Store at room temperature below 30 degrees C (86 degrees F). Keep container tightly closed. Protect from moisture. Throw away any unused medication after the expiration date. NOTE: This sheet is a summary. It may not cover all possible information. If you have questions about this medicine, talk to your doctor, pharmacist,  or health care provider.  2024 [employer] Standard (2021-07-20  00:00:00)

## 2022-12-22 NOTE — Addendum Note (Signed)
Addended by: Judi Cong on: 12/22/2022 11:58 AM   Modules accepted: Orders

## 2022-12-22 NOTE — Telephone Encounter (Signed)
Pt called to report he will be here by appointment time of 10:30, there is a delay due to traffic on highway.
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
## 2022-12-23 LAB — CARBAMAZEPINE LEVEL, TOTAL: Carbamazepine (Tegretol), S: 6.5 ug/mL (ref 4.0–12.0)

## 2022-12-27 ENCOUNTER — Telehealth: Payer: Self-pay | Admitting: *Deleted

## 2022-12-27 NOTE — Telephone Encounter (Signed)
-----   Message from Hatch Dohmeier sent at 12/23/2022 12:01 PM EST ----- Therapeutic level of carbamazepine. We checked to make sure that his impaired renal function doesn't interfere with the blood levels. I plan to reduce CBZ if renal function decreases in the future. We may switch to keppra.

## 2022-12-27 NOTE — Telephone Encounter (Signed)
Spoke to patient gave labwork results gave  Dr.Dohmeier  future recommendation . Pt expressed understanding and thanked me for calling

## 2023-01-10 NOTE — Telephone Encounter (Signed)
Closing encounter

## 2023-01-14 NOTE — Patient Instructions (Addendum)
 It was great to see you today If not done already recommend the RSV vaccine, a tetanus booster, COVID booster if not done in the last 6 months I will be in touch with labs, assuming all is well please see me in 6 months
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
## 2023-01-18 ENCOUNTER — Ambulatory Visit: Payer: Medicare Other | Admitting: Family Medicine

## 2023-01-18 ENCOUNTER — Encounter: Payer: Self-pay | Admitting: Family Medicine

## 2023-01-18 VITALS — BP 124/80 | HR 68 | Temp 97.8°F | Resp 18 | Ht 72.0 in | Wt 202.4 lb

## 2023-01-18 DIAGNOSIS — I2510 Atherosclerotic heart disease of native coronary artery without angina pectoris: Secondary | ICD-10-CM

## 2023-01-18 DIAGNOSIS — E785 Hyperlipidemia, unspecified: Secondary | ICD-10-CM

## 2023-01-18 DIAGNOSIS — E1151 Type 2 diabetes mellitus with diabetic peripheral angiopathy without gangrene: Secondary | ICD-10-CM

## 2023-01-18 DIAGNOSIS — Z23 Encounter for immunization: Secondary | ICD-10-CM

## 2023-01-18 DIAGNOSIS — I10 Essential (primary) hypertension: Secondary | ICD-10-CM

## 2023-01-18 DIAGNOSIS — Z7984 Long term (current) use of oral hypoglycemic drugs: Secondary | ICD-10-CM | POA: Diagnosis not present

## 2023-01-18 LAB — COMPREHENSIVE METABOLIC PANEL
ALT: 16 U/L (ref 0–53)
AST: 17 U/L (ref 0–37)
Albumin: 4.5 g/dL (ref 3.5–5.2)
Alkaline Phosphatase: 118 U/L — ABNORMAL HIGH (ref 39–117)
BUN: 17 mg/dL (ref 6–23)
CO2: 27 meq/L (ref 19–32)
Calcium: 9.1 mg/dL (ref 8.4–10.5)
Chloride: 107 meq/L (ref 96–112)
Creatinine, Ser: 1.22 mg/dL (ref 0.40–1.50)
GFR: 59.55 mL/min — ABNORMAL LOW (ref >60.00)
Glucose, Bld: 120 mg/dL — ABNORMAL HIGH (ref 70–99)
Potassium: 4.2 meq/L (ref 3.5–5.1)
Sodium: 142 meq/L (ref 135–145)
Total Bilirubin: 0.4 mg/dL (ref 0.2–1.2)
Total Protein: 7.5 g/dL (ref 6.0–8.3)

## 2023-01-18 LAB — CBC
HCT: 39.1 % (ref 39.0–52.0)
Hemoglobin: 12.4 g/dL — ABNORMAL LOW (ref 13.0–17.0)
MCHC: 31.8 g/dL (ref 30.0–36.0)
MCV: 85.9 fL (ref 78.0–100.0)
Platelets: 163 10*3/uL (ref 150.0–400.0)
RBC: 4.55 Mil/uL (ref 4.22–5.81)
RDW: 14.3 % (ref 11.5–15.5)
WBC: 3.9 10*3/uL — ABNORMAL LOW (ref 4.0–10.5)

## 2023-01-18 LAB — LIPID PANEL
Cholesterol: 123 mg/dL (ref 0–200)
HDL: 47.2 mg/dL (ref >39.00)
LDL Cholesterol: 58 mg/dL (ref 0–99)
NonHDL: 76.26
Total CHOL/HDL Ratio: 3
Triglycerides: 93 mg/dL (ref 0.0–149.0)
VLDL: 18.6 mg/dL (ref 0.0–40.0)

## 2023-01-31 ENCOUNTER — Telehealth: Payer: Self-pay

## 2023-01-31 NOTE — Telephone Encounter (Signed)
Copied from CRM [id number]. Topic: General - Other >> Jan 31, 2023  2:25 PM [place] A wrote: Reason for CRM: Patient called for results for labs however the labs were Abnormal-Agent did not divulge results

## 2023-02-01 NOTE — Telephone Encounter (Signed)
Will call pt back to give labs per MyChart:   "Your blood counts show a minimally low white cell count.  This abnormality is so minimal that I think it is likely of no major concern.  Your hemoglobin is low, but it continues to trend in the right direction over the last several months   Metabolic profile looks good.  Kidney function is almost normal, the best I have seen it in a year.   Cholesterol also looks good Lets get together and look at your labs in about 6 months"

## 2023-02-01 NOTE — Telephone Encounter (Signed)
Lab results have been given to the pt and mailed.

## 2023-02-02 ENCOUNTER — Ambulatory Visit: Payer: Medicare Other

## 2023-02-02 VITALS — Ht 73.0 in | Wt 202.0 lb

## 2023-02-02 DIAGNOSIS — Z0000 Encounter for general adult medical examination without abnormal findings: Secondary | ICD-10-CM | POA: Diagnosis not present

## 2023-02-02 NOTE — Patient Instructions (Addendum)
Mr. Lello , Thank you for taking time to come for your Medicare Wellness Visit. I appreciate your ongoing commitment to your health goals. Please review the following plan we discussed and let me know if I can assist you in the future.   Referrals/Orders/Follow-Ups/Clinician Recommendations:   This is a list of the screening recommended for you and due dates:  Health Maintenance  Topic Date Due   Zoster (Shingles) Vaccine (2 of 2) 06/22/2016   DTaP/Tdap/Td vaccine (2 - Td or Tdap) 03/06/2022   COVID-19 Vaccine (7 - 2024-25 season) 09/11/2022   Complete foot exam   02/08/2023   Hemoglobin A1C  06/04/2023   Yearly kidney health urinalysis for diabetes  09/14/2023   Eye exam for diabetics  09/26/2023   Yearly kidney function blood test for diabetes  01/18/2024   Medicare Annual Wellness Visit  02/02/2024   Colon Cancer Screening  11/21/2026   Pneumonia Vaccine  Completed   Flu Shot  Completed   Hepatitis C Screening  Completed   HPV Vaccine  Aged Out    Advanced directives: (Declined) Advance directive discussed with you today. Even though you declined this today, please call our office should you change your mind, and we can give you the proper paperwork for you to fill out.  Next Medicare Annual Wellness Visit scheduled for next year: Yes
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
## 2023-02-15 ENCOUNTER — Other Ambulatory Visit: Payer: Self-pay | Admitting: Internal Medicine

## 2023-03-06 ENCOUNTER — Other Ambulatory Visit: Payer: Self-pay

## 2023-03-11 ENCOUNTER — Other Ambulatory Visit: Payer: Self-pay | Admitting: Family Medicine

## 2023-04-04 ENCOUNTER — Ambulatory Visit (INDEPENDENT_AMBULATORY_CARE_PROVIDER_SITE_OTHER): Payer: Medicare Other | Admitting: Endocrinology

## 2023-04-04 ENCOUNTER — Encounter: Payer: Self-pay | Admitting: Endocrinology

## 2023-04-04 VITALS — BP 138/80 | HR 66 | Resp 20 | Ht 73.0 in | Wt 210.8 lb

## 2023-04-04 DIAGNOSIS — Z7984 Long term (current) use of oral hypoglycemic drugs: Secondary | ICD-10-CM

## 2023-04-04 DIAGNOSIS — E1151 Type 2 diabetes mellitus with diabetic peripheral angiopathy without gangrene: Secondary | ICD-10-CM | POA: Diagnosis not present

## 2023-04-04 LAB — POCT GLYCOSYLATED HEMOGLOBIN (HGB A1C): Hemoglobin A1C: 7.1 % — AB (ref 4.0–5.6)
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
## 2023-04-04 NOTE — Patient Instructions (Signed)
 Continue farxiga 5mg  daily.

## 2023-04-05 ENCOUNTER — Other Ambulatory Visit: Payer: Self-pay | Admitting: Family Medicine

## 2023-04-05 DIAGNOSIS — E785 Hyperlipidemia, unspecified: Secondary | ICD-10-CM

## 2023-04-11 DIAGNOSIS — R972 Elevated prostate specific antigen [PSA]: Secondary | ICD-10-CM | POA: Diagnosis not present

## 2023-04-20 DIAGNOSIS — R351 Nocturia: Secondary | ICD-10-CM | POA: Diagnosis not present

## 2023-04-20 DIAGNOSIS — N401 Enlarged prostate with lower urinary tract symptoms: Secondary | ICD-10-CM | POA: Diagnosis not present

## 2023-04-20 DIAGNOSIS — R350 Frequency of micturition: Secondary | ICD-10-CM | POA: Diagnosis not present

## 2023-04-21 ENCOUNTER — Other Ambulatory Visit: Payer: Self-pay | Admitting: Family Medicine

## 2023-05-01 ENCOUNTER — Other Ambulatory Visit: Payer: Self-pay | Admitting: Family Medicine

## 2023-05-01 DIAGNOSIS — J309 Allergic rhinitis, unspecified: Secondary | ICD-10-CM

## 2023-05-02 DIAGNOSIS — H401121 Primary open-angle glaucoma, left eye, mild stage: Secondary | ICD-10-CM | POA: Diagnosis not present

## 2023-05-02 DIAGNOSIS — H40032 Anatomical narrow angle, left eye: Secondary | ICD-10-CM | POA: Diagnosis not present
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
## 2023-05-10 ENCOUNTER — Ambulatory Visit (INDEPENDENT_AMBULATORY_CARE_PROVIDER_SITE_OTHER): Admitting: Family Medicine

## 2023-05-10 VITALS — BP 142/82 | HR 69 | Temp 98.0°F | Resp 16 | Ht 72.0 in | Wt 210.6 lb

## 2023-05-10 DIAGNOSIS — Z7984 Long term (current) use of oral hypoglycemic drugs: Secondary | ICD-10-CM

## 2023-05-10 DIAGNOSIS — S20211A Contusion of right front wall of thorax, initial encounter: Secondary | ICD-10-CM

## 2023-05-10 DIAGNOSIS — E785 Hyperlipidemia, unspecified: Secondary | ICD-10-CM

## 2023-05-10 DIAGNOSIS — I10 Essential (primary) hypertension: Secondary | ICD-10-CM

## 2023-05-10 DIAGNOSIS — I2510 Atherosclerotic heart disease of native coronary artery without angina pectoris: Secondary | ICD-10-CM | POA: Diagnosis not present

## 2023-05-10 DIAGNOSIS — E1151 Type 2 diabetes mellitus with diabetic peripheral angiopathy without gangrene: Secondary | ICD-10-CM | POA: Diagnosis not present

## 2023-05-11 ENCOUNTER — Encounter: Payer: Self-pay | Admitting: Family Medicine

## 2023-05-11 LAB — CBC
HCT: 41.1 % (ref 39.0–52.0)
Hemoglobin: 13.2 g/dL (ref 13.0–17.0)
MCHC: 32.2 g/dL (ref 30.0–36.0)
MCV: 88 fl (ref 78.0–100.0)
Platelets: 171 10*3/uL (ref 150.0–400.0)
RBC: 4.67 Mil/uL (ref 4.22–5.81)
RDW: 14.3 % (ref 11.5–15.5)
WBC: 4.7 10*3/uL (ref 4.0–10.5)

## 2023-05-11 LAB — COMPREHENSIVE METABOLIC PANEL WITH GFR
ALT: 12 U/L (ref 0–53)
AST: 14 U/L (ref 0–37)
Albumin: 4.6 g/dL (ref 3.5–5.2)
Alkaline Phosphatase: 126 U/L — ABNORMAL HIGH (ref 39–117)
BUN: 22 mg/dL (ref 6–23)
CO2: 24 meq/L (ref 19–32)
Calcium: 9.2 mg/dL (ref 8.4–10.5)
Chloride: 109 meq/L (ref 96–112)
Creatinine, Ser: 1.41 mg/dL (ref 0.40–1.50)
GFR: 49.94 mL/min — ABNORMAL LOW (ref >60.00)
Glucose, Bld: 143 mg/dL — ABNORMAL HIGH (ref 70–99)
Potassium: 4 meq/L (ref 3.5–5.1)
Sodium: 143 meq/L (ref 135–145)
Total Bilirubin: 0.5 mg/dL (ref 0.2–1.2)
Total Protein: 8 g/dL (ref 6.0–8.3)

## 2023-05-11 LAB — LIPID PANEL
Cholesterol: 134 mg/dL (ref 0–200)
HDL: 54.9 mg/dL (ref >39.00)
LDL Cholesterol: 60 mg/dL (ref 0–99)
NonHDL: 79.1
Total CHOL/HDL Ratio: 2
Triglycerides: 96 mg/dL (ref 0.0–149.0)
VLDL: 19.2 mg/dL (ref 0.0–40.0)

## 2023-05-24 DIAGNOSIS — H401121 Primary open-angle glaucoma, left eye, mild stage: Secondary | ICD-10-CM | POA: Diagnosis not present

## 2023-06-02 DIAGNOSIS — H402222 Chronic angle-closure glaucoma, left eye, moderate stage: Secondary | ICD-10-CM | POA: Diagnosis not present

## 2023-06-02 LAB — HM DIABETES EYE EXAM

## 2023-06-06 ENCOUNTER — Encounter: Payer: Self-pay | Admitting: Family Medicine

## 2023-06-06 DIAGNOSIS — H40032 Anatomical narrow angle, left eye: Secondary | ICD-10-CM | POA: Diagnosis not present

## 2023-06-06 DIAGNOSIS — H402222 Chronic angle-closure glaucoma, left eye, moderate stage: Secondary | ICD-10-CM | POA: Diagnosis not present

## 2023-06-08 LAB — HM DIABETES EYE EXAM

## 2023-06-21 DIAGNOSIS — H401122 Primary open-angle glaucoma, left eye, moderate stage: Secondary | ICD-10-CM | POA: Diagnosis not present

## 2023-06-21 DIAGNOSIS — H402222 Chronic angle-closure glaucoma, left eye, moderate stage: Secondary | ICD-10-CM | POA: Diagnosis not present

## 2023-06-21 DIAGNOSIS — H40032 Anatomical narrow angle, left eye: Secondary | ICD-10-CM | POA: Diagnosis not present

## 2023-07-05 ENCOUNTER — Encounter: Payer: Self-pay | Admitting: Endocrinology

## 2023-07-05 ENCOUNTER — Ambulatory Visit: Payer: Self-pay | Admitting: Endocrinology

## 2023-07-05 ENCOUNTER — Ambulatory Visit (INDEPENDENT_AMBULATORY_CARE_PROVIDER_SITE_OTHER): Admitting: Endocrinology

## 2023-07-05 VITALS — BP 142/70 | HR 59 | Resp 20 | Ht 72.0 in | Wt 216.8 lb

## 2023-07-05 DIAGNOSIS — E1165 Type 2 diabetes mellitus with hyperglycemia: Secondary | ICD-10-CM

## 2023-07-05 DIAGNOSIS — Z7984 Long term (current) use of oral hypoglycemic drugs: Secondary | ICD-10-CM | POA: Diagnosis not present

## 2023-07-05 DIAGNOSIS — E1169 Type 2 diabetes mellitus with other specified complication: Secondary | ICD-10-CM

## 2023-07-05 LAB — POCT GLYCOSYLATED HEMOGLOBIN (HGB A1C): Hemoglobin A1C: 7.2 % — AB (ref 4.0–5.6)

## 2023-07-05 MED ORDER — DAPAGLIFLOZIN PROPANEDIOL 10 MG PO TABS: 10.0000 mg | ORAL_TABLET | Freq: Every day | ORAL | 3 refills | Status: DC
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
## 2023-07-05 NOTE — Patient Instructions (Signed)
 Latest Reference Range & Units 12/05/22 11:05 04/04/23 11:06 07/05/23 11:00  Hemoglobin A1C 4.0 - 5.6 % 6.4 ! Pend 7.1 ! Pend 7.2 !  !: Data is abnormal

## 2023-07-19 ENCOUNTER — Ambulatory Visit: Payer: Medicare Other | Admitting: Family Medicine

## 2023-07-24 DIAGNOSIS — H402222 Chronic angle-closure glaucoma, left eye, moderate stage: Secondary | ICD-10-CM | POA: Diagnosis not present

## 2023-07-24 DIAGNOSIS — H401122 Primary open-angle glaucoma, left eye, moderate stage: Secondary | ICD-10-CM | POA: Diagnosis not present

## 2023-07-24 LAB — HM DIABETES EYE EXAM

## 2023-08-04 ENCOUNTER — Encounter: Payer: Self-pay | Admitting: Family Medicine

## 2023-09-15 NOTE — Progress Notes (Deleted)
 Last seen here for revisit 12/22/2022 for refills, Taking Tegretol  for facial twitching , no more focal seizures,  just yearly visit for refills .  Notable are REM BD history ( or PTSD?) , HTN,  he is on Catapres  patches. Plavix   for CAD/ TIA.   Andrew [name] is a 72 y.o. male patient who  has followed up with Duwaine Russell, NP . Last visit was for facial paresthesia on  01-27-2021.  Had open heart /bypass surgery 12-08-2021.  He is diabetic, has undergone cardiology work up with Vina Ross,MD,  had hyperkalemia- Dr Perri  (Nephrology) saw him in hospital at [facility] Dx with CKD 3.   And took him off many meds. His Hb A1c was 6.2 in August 24, Fructosamine 303. Creatinine 1.26, GFR 57. TSH 1.8 .       01/27/21:Mr. [name] is a 73 year old male with a history of sensory disturbance paresthesias on the left side of the face.  He also has vivid dreams and sleep-related bruxism.   He states overall his symptoms have remained stable.  He is only had 1-2 episodes since last seen where he was jerking as he was waking up from a dream.  He states that he has 1-2 episodes a week where the left side of his face feels numb or cold and is only last for 10 seconds.  This has not changed.  He remains on carbamazepine  200 mg daily

## 2023-09-25 ENCOUNTER — Ambulatory Visit: Payer: Medicare Other | Admitting: Neurology

## 2023-09-25 ENCOUNTER — Other Ambulatory Visit: Payer: Self-pay | Admitting: Family Medicine

## 2023-09-25 DIAGNOSIS — G4752 REM sleep behavior disorder: Secondary | ICD-10-CM

## 2023-09-25 DIAGNOSIS — E785 Hyperlipidemia, unspecified: Secondary | ICD-10-CM

## 2023-09-25 DIAGNOSIS — G4763 Sleep related bruxism: Secondary | ICD-10-CM

## 2023-10-08 ENCOUNTER — Other Ambulatory Visit: Payer: Self-pay | Admitting: Adult Health

## 2023-10-11 ENCOUNTER — Encounter: Payer: Self-pay | Admitting: Neurology

## 2023-10-11 ENCOUNTER — Ambulatory Visit (INDEPENDENT_AMBULATORY_CARE_PROVIDER_SITE_OTHER): Admitting: Neurology

## 2023-10-11 VITALS — BP 152/86 | HR 65 | Ht 72.0 in | Wt 219.0 lb

## 2023-10-11 DIAGNOSIS — G514 Facial myokymia: Secondary | ICD-10-CM

## 2023-10-11 DIAGNOSIS — G4752 REM sleep behavior disorder: Secondary | ICD-10-CM | POA: Diagnosis not present

## 2023-10-11 DIAGNOSIS — G4713 Recurrent hypersomnia: Secondary | ICD-10-CM | POA: Insufficient documentation

## 2023-10-11 DIAGNOSIS — I25119 Atherosclerotic heart disease of native coronary artery with unspecified angina pectoris: Secondary | ICD-10-CM | POA: Diagnosis not present

## 2023-10-11 DIAGNOSIS — F518 Other sleep disorders not due to a substance or known physiological condition: Secondary | ICD-10-CM

## 2023-10-11 MED ORDER — CARBAMAZEPINE 200 MG PO TABS: 200.0000 mg | ORAL_TABLET | Freq: Every day | ORAL | 3 refills | Status: DC
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
## 2023-10-11 NOTE — Addendum Note (Signed)
 Addended by: Adrena Nakamura K on: 10/11/2023 11:08 AM   Modules accepted: Orders

## 2023-10-11 NOTE — Patient Instructions (Signed)
 Hypersomnia Hypersomnia is a condition in which a person feels very tired during the day even though the person gets plenty of sleep at night. A person with this condition may take naps during the day and may find it very difficult to wake up from sleep. Hypersomnia may affect a person's ability to think, concentrate, drive, or remember things. What are the causes? The cause of this condition may not be known. Possible causes include: Taking certain medicines. Using drugs or alcohol. Sleep disorders, such as narcolepsy and sleep apnea. Injury to the head, brain, or spinal cord. Tumors. Certain medical conditions. These include: Depression. Diabetes. Gastroesophageal reflux disease (GERD). An underactive thyroid  gland (hypothyroidism). What are the signs or symptoms? The main symptoms of hypersomnia include: Feeling very tired throughout the day, regardless of how much sleep you got the night before. Having trouble waking up. Others may find it difficult to wake you up when you are sleeping. Sleeping for longer and longer periods at a time. Taking naps throughout the day. Other symptoms may include: Feeling restless, anxious, or annoyed. Lacking energy. Having trouble with: Remembering. Speaking. Thinking. Loss of appetite. Seeing, hearing, tasting, smelling, or feeling things that are not real (hallucinations). How is this diagnosed? This condition may be diagnosed based on: Your symptoms and medical history. Your sleeping habits. Your health care provider may ask you to write down your sleeping habits in a daily sleep log, along with any symptoms you have. A series of tests that are done while you sleep (sleep study or polysomnogram). A test that measures how quickly you can fall asleep during the day (daytime nap study or multiple sleep latency test). How is this treated? This condition may be treated by: Following a regular sleep routine. Making lifestyle changes, such  as changing your eating habits, getting regular exercise, and avoiding alcohol or caffeinated beverages. Taking medicines to make you more alert (stimulants) during the day. Treating any underlying medical causes of hypersomnia. Follow these instructions at home: Sleep habits Stick to a routine that includes going to bed and waking up at the same times every day and night. Practice a relaxing bedtime routine. This may include reading, meditation, deep breathing, or taking a warm bath before going to sleep. Exercise regularly as told by your health care provider. However, avoid exercising in the hours right before bedtime. Keep your sleep environment at a cooler temperature, darkened, and quiet. Sleep with pillows and a mattress that are comfortable and supportive. Schedule short 20-minute naps for when you feel sleepiest during the day. Talk with your employer or teachers about your hypersomnia. If possible, adjust your schedule so that: You have a regular daytime work schedule. You can take a scheduled nap during the day. You do not have to work or be active at night. Do not eat a heavy meal for a few hours before bedtime. Eat your meals at about the same times every day. Safety  Do not drive or use machinery if you are sleepy. Ask your health care provider if it is safe for you to drive. Wear a life jacket when swimming or spending time near water. General instructions  Take over-the-counter and prescription medicines only as told by your health care provider. This includes supplements. Avoid drinking alcohol or caffeinated beverages. Keep a sleep log that will help your health care provider manage your condition. This may include information about: What time you go to bed each night. How often you wake up at night. How many  hours you sleep at night. How often and for how long you nap during the day. Any observations from others, such as leg movements during sleep, sleep walking, or  snoring. Keep all follow-up visits. This is important. Contact a health care provider if: You have new symptoms. Your symptoms get worse. Get help right away if: You have thoughts about hurting yourself or someone else. Get help right away if you feel like you may hurt yourself or others, or have thoughts about taking your own life. Go to your nearest emergency room or: Call 911. Call the National Suicide Prevention Lifeline at 646 840 9415 or 988. This is open 24 hours a day. Text the Crisis Text Line at 669-725-4958. Summary Hypersomnia refers to a condition in which you feel very tired during the day even though you get plenty of sleep at night. A person with this condition may take naps during the day and may find it very difficult to wake up from sleep. Hypersomnia may affect a person's ability to think, concentrate, drive, or remember things. Treatment may include a regular sleep routine and making some lifestyle changes. This information is not intended to replace advice given to you by your health care provider. Make sure you discuss any questions you have with your health care provider. Document Revised: 12/07/2020 Document Reviewed: 12/07/2020 Elsevier Patient Education  2024 Elsevier Inc.Carbamazepine  Tablets What is this medication? CARBAMAZEPINE  (kar ba MAZ e peen) prevents and controls seizures in people with epilepsy. It may also be used to treat nerve pain. It works by calming overactive nerves in your body. This medicine may be used for other purposes; ask your health care provider or pharmacist if you have questions. COMMON BRAND NAME(S): Epitol , Tegretol  What should I tell my care team before I take this medication? They need to know if you have any of these conditions: Asian ancestry Bone marrow disease Glaucoma Heart disease Irregular heartbeat or rhythm Kidney disease Liver disease Low blood cell levels (white cells, red cells, or platelets) Mental health  conditions Porphyria Suicidal thoughts, plans, or attempt by you or a family member An unusual or allergic reaction to carbamazepine , other medications, foods, dyes, or preservatives Pregnant or trying to get pregnant Breastfeeding How should I use this medication? Take this medication by mouth with a glass of water. Follow the directions on the prescription label. Take this medication with food. Take your doses at regular intervals. Do not take your medication more often than directed. Do not stop taking this medication except on the advice of your care team. A special MedGuide will be given to you by the pharmacist with each prescription and refill. Be sure to read this information carefully each time. Talk to your care team about the use of this medication in children. Special care may be needed. Overdosage: If you think you have taken too much of this medicine contact a poison control center or emergency room at once. NOTE: This medicine is only for you. Do not share this medicine with others. What if I miss a dose? If you miss a dose, take it as soon as you can. If it is almost time for your next dose, take only that dose. Do not take double or extra doses. What may interact with this medication? Do not take this medication with any of the following: Certain medications used to treat HIV infection or AIDS that are given in combination with cobicistat Delavirdine MAOIs like Carbex, Eldepryl, Marplan, Nardil, and Parnate Nefazodone Oxcarbazepine This medication may  also interact with the following: Acetaminophen  Acetazolamide Barbiturate medications for inducing sleep or treating seizures, like phenobarbital Certain antibiotics like clarithromycin , erythromycin or troleandomycin Cimetidine Cyclosporine Danazol Dicumarol Doxycycline  Male hormones, including estrogens and birth control pills Grapefruit juice Isoniazid, INH Levothyroxine and other thyroid  hormones Lithium and  other medications to treat mood problems or psychotic disturbances Loratadine  Medications for angina or high blood pressure Medications for cancer Medications for depression or anxiety Medications for sleep Medications to treat fungal infections, like fluconazole , itraconazole or ketoconazole Medications used to treat HIV infection or AIDS Methadone Niacinamide Praziquantel Propoxyphene Rifampin or rifabutin Seizure or epilepsy medication Steroid medications such as prednisone  or cortisone Theophylline Tramadol  Warfarin This list may not describe all possible interactions. Give your health care provider a list of all the medicines, herbs, non-prescription drugs, or dietary supplements you use. Also tell them if you smoke, drink alcohol, or use illegal drugs. Some items may interact with your medicine. What should I watch for while using this medication? Visit your care team for regular checks on your progress. Do not change brands or dosage forms of this medication without discussing it with your care team. If you are taking this medication for epilepsy (seizures), do not stop taking it suddenly. This increases the risk of seizures. Wear a Arboriculturist or necklace. Carry an identification card with information about your condition, medications, and care team. This medication may cause serious skin reactions. They can happen weeks to months after starting the medication. Contact your care team right away if you notice fevers or flu-like symptoms with a rash. The rash may be red or purple and then turn into blisters or peeling of the skin. You may also notice a red rash with swelling of the face, lips, or lymph nodes in your neck or under your arms. This medication may affect your coordination, reaction time, or judgment. Do not drive or operate machinery until you know how this medication affects you. Sit up or stand slowly to reduce the risk of dizzy or fainting spells. Drinking alcohol  with this medication can increase the risk of these side effects. Estrogen and progestin hormones may not work as well while you are taking this medication. A barrier contraceptive, such as a condom or diaphragm, is recommended if you are using these hormones for contraception. Talk to your care team about effective forms of contraception. This medication can make you more sensitive to the sun. Keep out of the sun. If you cannot avoid being in the sun, wear protective clothing and sunscreen. Do not use sun lamps, tanning beds, or tanning booths. This medication may cause thoughts of suicide or depression. This includes sudden changes in mood, behaviors, or thoughts. These changes can happen at any time but are more common in the beginning of treatment or after a change in dose. Call your care team right away if you experience these thoughts or worsening depression. Women who become pregnant while using this medication may enroll in the [place] American Antiepileptic Drug Pregnancy Registry by calling ([phone]. This registry collects information about the safety of antiepileptic medication use during pregnancy. This medication may cause a decrease in vitamin D and folic acid. You should make sure that you get enough vitamins while you are taking this medication. Discuss the foods you eat and the vitamins you take with your care team. What side effects may I notice from receiving this medication? Side effects that you should report to your care team as soon as possible: Allergic  reactions--skin rash, itching, hives, swelling of the face, lips, tongue, or throat Aplastic anemia--unusual weakness or fatigue, dizziness, headache, trouble breathing, increased bleeding or bruising Change in vision Heart rhythm changes--fast or irregular heartbeat, dizziness, feeling faint or lightheaded, chest pain, trouble breathing Infection--fever, chills, cough, or sore throat Liver injury--right upper belly pain, loss  of appetite, nausea, light-colored stool, dark yellow or brown urine, yellowing skin or eyes, unusual weakness or fatigue Low sodium level--muscle weakness, fatigue, dizziness, headache, confusion Rash, fever, and swollen lymph nodes Redness, blistering, peeling or loosening of the skin, including inside the mouth Thoughts of suicide or self-harm, worsening mood, feelings of depression Side effects that usually do not require medical attention (report to your care team if they continue or are bothersome): Dizziness Drowsiness Loss of balance or coordination Nausea Vomiting This list may not describe all possible side effects. Call your doctor for medical advice about side effects. You may report side effects to FDA at 1-800-FDA-1088. Where should I keep my medication? Keep out of reach of children. Store at room temperature below 30 degrees C (86 degrees F). Keep container tightly closed. Protect from moisture. Throw away any unused medication after the expiration date. NOTE: This sheet is a summary. It may not cover all possible information. If you have questions about this medicine, talk to your doctor, pharmacist, or health care provider.  2024 Elsevier/Gold Standard (2021-07-20 00:00:00)

## 2023-10-12 ENCOUNTER — Ambulatory Visit: Payer: Self-pay | Admitting: Neurology

## 2023-10-12 LAB — CBC WITH DIFFERENTIAL/PLATELET
Basophils Absolute: 0 x10E3/uL (ref 0.0–0.2)
Basos: 1 %
EOS (ABSOLUTE): 0.2 x10E3/uL (ref 0.0–0.4)
Eos: 4 %
Hematocrit: 40.2 % (ref 37.5–51.0)
Hemoglobin: 12.9 g/dL — ABNORMAL LOW (ref 13.0–17.7)
Immature Grans (Abs): 0 x10E3/uL (ref 0.0–0.1)
Immature Granulocytes: 0 %
Lymphocytes Absolute: 1.6 x10E3/uL (ref 0.7–3.1)
Lymphs: 29 %
MCH: 28.3 pg (ref 26.6–33.0)
MCHC: 32.1 g/dL (ref 31.5–35.7)
MCV: 88 fL (ref 79–97)
Monocytes Absolute: 0.5 x10E3/uL (ref 0.1–0.9)
Monocytes: 8 %
Neutrophils Absolute: 3.3 x10E3/uL (ref 1.4–7.0)
Neutrophils: 58 %
Platelets: 140 x10E3/uL — ABNORMAL LOW (ref 150–450)
RBC: 4.56 x10E6/uL (ref 4.14–5.80)
RDW: 12.5 % (ref 11.6–15.4)
WBC: 5.7 x10E3/uL (ref 3.4–10.8)

## 2023-10-12 LAB — COMPREHENSIVE METABOLIC PANEL WITH GFR
ALT: 15 IU/L (ref 0–44)
AST: 18 IU/L (ref 0–40)
Albumin: 4.2 g/dL (ref 3.8–4.8)
Alkaline Phosphatase: 152 IU/L — ABNORMAL HIGH (ref 47–123)
BUN/Creatinine Ratio: 13 (ref 10–24)
BUN: 17 mg/dL (ref 8–27)
Bilirubin Total: 0.5 mg/dL (ref 0.0–1.2)
CO2: 23 mmol/L (ref 20–29)
Calcium: 9.2 mg/dL (ref 8.6–10.2)
Chloride: 107 mmol/L — ABNORMAL HIGH (ref 96–106)
Creatinine, Ser: 1.29 mg/dL — ABNORMAL HIGH (ref 0.76–1.27)
Globulin, Total: 3.2 g/dL (ref 1.5–4.5)
Glucose: 165 mg/dL — ABNORMAL HIGH (ref 70–99)
Potassium: 4.7 mmol/L (ref 3.5–5.2)
Sodium: 144 mmol/L (ref 134–144)
Total Protein: 7.4 g/dL (ref 6.0–8.5)
eGFR: 59 mL/min/1.73 — ABNORMAL LOW (ref >59)

## 2023-10-12 LAB — CARBAMAZEPINE LEVEL, TOTAL: Carbamazepine (Tegretol), S: 7.8 ug/mL (ref 4.0–12.0)

## 2023-10-16 NOTE — Telephone Encounter (Signed)
 Called pt at [phone]. Relayed results per Dr. Lionell note. He verbalized understanding.  Forwarded results to PCP via epic. He will continue close f/u with her. Has appt this month.  He asked for copy of results be mailed to him. Aware I will send to medical records to help with this request.

## 2023-10-19 ENCOUNTER — Telehealth: Payer: Self-pay

## 2023-10-19 NOTE — Telephone Encounter (Signed)
 Called pt and left a VM informing pt of message, advised pt to give the office a call back for any further questions or concerns.

## 2023-10-19 NOTE — Telephone Encounter (Signed)
 Please give him a callback, I looked over his labs and did note minimal anemia on his CBC from earlier this month.  However, please reassure him that I feel certain cardiology will make sure everything is optimized before he has his open heart surgery.  We will take care of these details for him  JC

## 2023-10-19 NOTE — Telephone Encounter (Signed)
 Copied from CRM [id number]. Topic: Clinical - Medical Advice >> Oct 19, 2023 10:17 AM [place] J wrote: Reason for CRM: Patient was recently diagnosed with anemia by his neurologist. He is scheduled for open heart surgery 20 months from now and would like to know if this affects anything in regards to his procedure that needs to take place.

## 2023-10-25 ENCOUNTER — Ambulatory Visit: Admitting: Neurology

## 2023-10-25 DIAGNOSIS — G514 Facial myokymia: Secondary | ICD-10-CM

## 2023-10-25 DIAGNOSIS — I10 Essential (primary) hypertension: Secondary | ICD-10-CM

## 2023-10-25 DIAGNOSIS — F518 Other sleep disorders not due to a substance or known physiological condition: Secondary | ICD-10-CM

## 2023-10-25 DIAGNOSIS — G4752 REM sleep behavior disorder: Secondary | ICD-10-CM

## 2023-10-25 DIAGNOSIS — G4710 Hypersomnia, unspecified: Secondary | ICD-10-CM | POA: Diagnosis not present

## 2023-10-25 DIAGNOSIS — G4713 Recurrent hypersomnia: Secondary | ICD-10-CM

## 2023-10-25 DIAGNOSIS — R682 Dry mouth, unspecified: Secondary | ICD-10-CM

## 2023-10-25 DIAGNOSIS — I25119 Atherosclerotic heart disease of native coronary artery with unspecified angina pectoris: Secondary | ICD-10-CM

## 2023-10-26 NOTE — Progress Notes (Signed)
 [facility] Andrew [name] 73 year old male [birth year]   HOME SLEEP TEST REPORT ( by Watch PAT)   STUDY DATE:  10-25-2023    ORDERING CLINICIAN:  REFERRING CLINICIAN:  Dr Watt, MD    CLINICAL INFORMATION/HISTORY:  Andrew [name] , ' Andrew [name]  , is a 73 y.o. AA male patient who is right handed here for revisit 10/11/2023 for  follow up on sleepiness, right facial numbness. Long standing history of  Facial Dyseasthesias , not necessary painful.   Formerly Dr Evelina patient. He reports : 'No changes   Tegretol   controls the facial twitching. facial dysesthesias and feeling of left face   drawing up  , no drooling, no twitching. refill tegretol  , CBC and CMET ordered.    2) HST ordered for  daytime sleepiness.  No Snoring.  No witnessed apnea, No REM enactment.  BMI is under 30.  Neck size has increased again after heart surgery.    If HST is positive , will meet in 4-5 months, if not RV with Np , 12 months from now.    Last seen here for revisit 12/22/2022 for refills, Taking Tegretol  for facial twitching , no more focal seizures,  just yearly visit for refills .  Notable are REM BD history ( or PTSD?) , HTN,  he is on Catapres  patches. Plavix   for CAD/ TIA. Andrew [name] is a 73 y.o. male patient who  has followed up with Duwaine Russell, NP . Last visit was for facial paresthesia on  01-27-2021.  He reports vivid dreams.  Had open heart /bypass surgery 12-08-2021.  He is diabetic, has undergone cardiology work up with Vina Ross,MD,  had hyperkalemia- Dr Perri  (Nephrology) saw him in hospital at [facility] Dx with CKD 3.   And took him off many meds. His Hb A1c was 6.2 in August 24, Fructosamine 303. Creatinine 1.26, GFR 57. TSH 1.8 .       Epworth sleepiness score:  13/ 25   FSS at  32/ 63    GDS 1 / 15    BMI:  29.7 kg/m   Neck Circumference: 17, Mallampati 3-4       Sleep Summary:   Total Recording Time (hours, min):  8 hours 28 minutes        Total Sleep Time (hours, min):    7 hours 29 m             Percent REM (%):   17.2%                                      Respiratory Indices:   Calculated pAHI (per hour):    CMS based apnea-hypopnea indicis ( AHI ): 0.9/h                          REM pAHI:  5.4/h                                               NREM pAHI:  0/h  Positional  AHI:   all sleep was in supine position.   Snoring in dB , mean Volume:   40 db , that is at threshold of detection - mildest snoring.                                                Oxygen Saturation Statistics:   Oxygen Saturation (%) Mean:    96%            O2 Saturation Range (%):     between 90 and 99%                                  O2 Saturation (minutes) <89%:   0 minutes        Pulse Rate Statistics:   Pulse Mean (bpm): 56                 Pulse Range:    44 though 74 bpm  Caveat: the watch pat device does not provide data of cardiac rhythm.              IMPRESSION:  This HST confirms that no sleep apnea is present,neither hypoxia nor significant snoring.   RECOMMENDATION: no follow up for sleep related issues is needed.  The patient will follow up regularly in 12 months with Np [place].    Sleep fragmentation in the presence of normal proportional sleep stages is a nonspecific findings and per se does not signify an intrinsic sleep disorder or a cause for the patient's sleep-related symptoms.  Causes include (but are not limited to) the unfamiliarity of sleeping while recorded by HST device or sleeping in a sleep lab for a full Polysomnography sleep study, but also circadian rhythm disturbances, medication side effects or an underlying mood disorder or medical problem.     INTERPRETING PHYSICIAN: Dedra Gores, MD  [facility] and [facility] Sleep Board certified by The [employer] of Sleep Medicine and Diplomate of the [employer] of Sleep Medicine. Board certified In  Neurology through the ABPN, Fellow of the [employer] of Neurology.

## 2023-11-01 ENCOUNTER — Other Ambulatory Visit

## 2023-11-01 DIAGNOSIS — E1151 Type 2 diabetes mellitus with diabetic peripheral angiopathy without gangrene: Secondary | ICD-10-CM | POA: Diagnosis not present

## 2023-11-01 NOTE — Procedures (Signed)
 [facility] [name] 73 year old male 1950-04-17   HOME SLEEP TEST REPORT ( by Watch PAT)   STUDY DATE:  10-25-2023    ORDERING CLINICIAN:  REFERRING CLINICIAN:  Dr Watt, MD    CLINICAL INFORMATION/HISTORY:  Andrew [name] , ' Andrew [name]  , is a 73 y.o. AA male patient who is right handed here for revisit 10/11/2023 for  follow up on sleepiness, right facial numbness. Long standing history of  Facial Dyseasthesias , not necessary painful.   Formerly Dr Evelina patient. He reports : 'No changes   Tegretol   controls the facial twitching. facial dysesthesias and feeling of left face   drawing up  , no drooling, no twitching. refill tegretol  , CBC and CMET ordered.    2) HST ordered for  daytime sleepiness.  No Snoring.  No witnessed apnea, No REM enactment.  BMI is under 30.  Neck size has increased again after heart surgery.    If HST is positive , will meet in 4-5 months, if not RV with Np , 12 months from now.    Last seen here for revisit 12/22/2022 for refills, Taking Tegretol  for facial twitching , no more focal seizures,  just yearly visit for refills .  Notable are REM BD history ( or PTSD?) , HTN,  he is on Catapres  patches. Plavix   for CAD/ TIA. Andrew [name] is a 72 y.o. male patient who  has followed up with Duwaine Russell, NP . Last visit was for facial paresthesia on  01-27-2021.  He reports vivid dreams.  Had open heart /bypass surgery 12-08-2021.  He is diabetic, has undergone cardiology work up with Vina Ross,MD,  had hyperkalemia- Dr Perri  (Nephrology) saw him in hospital at [facility] Dx with CKD 3.   And took him off many meds. His Hb A1c was 6.2 in August 24, Fructosamine 303. Creatinine 1.26, GFR 57. TSH 1.8 .       Epworth sleepiness score:  13/ 25   FSS at  32/ 63    GDS 1 / 15    BMI:  29.7 kg/m   Neck Circumference: 17, Mallampati 3-4       Sleep Summary:   Total Recording Time (hours, min):  8 hours 28 minutes        Total Sleep Time (hours, min):    7 hours 29 m             Percent REM (%):   17.2%                                      Respiratory Indices:   Calculated pAHI (per hour):    CMS based apnea-hypopnea indicis ( AHI ): 0.9/h                          REM pAHI:  5.4/h                                               NREM pAHI:  0/h  Positional  AHI:   all sleep was in supine position.   Snoring in dB , mean Volume:   40 db , that is at threshold of detection - mildest snoring.                                                Oxygen Saturation Statistics:   Oxygen Saturation (%) Mean:    96%            O2 Saturation Range (%):     between 90 and 99%                                  O2 Saturation (minutes) <89%:   0 minutes        Pulse Rate Statistics:   Pulse Mean (bpm): 56                 Pulse Range:    44 though 74 bpm  Caveat: the watch pat device does not provide data of cardiac rhythm.              IMPRESSION:  This HST confirms that no sleep apnea is present,neither hypoxia nor significant snoring.  the patient's dream behaviour or possible REM BD cannot be evaluated by a HST.  He would needs to allow us  an in lab sleep study with expanded EEG montage  to further investigate that.   RECOMMENDATION: no follow up for sleep related issues is needed.  The patient will follow up regularly in 12 months with Np [place].    Sleep fragmentation in the presence of normal proportional sleep stages is a nonspecific findings and per se does not signify an intrinsic sleep disorder or a cause for the patient's sleep-related symptoms.  Causes include (but are not limited to) the unfamiliarity of sleeping while recorded by HST device or sleeping in a sleep lab for a full Polysomnography sleep study, but also circadian rhythm disturbances, medication side effects or an underlying mood disorder or medical problem.     INTERPRETING PHYSICIAN: Dedra Gores, MD   [facility] and [facility] Board certified by The [employer] of Sleep Medicine and Diplomate of the [employer] of Sleep Medicine. Board certified In Neurology through the ABPN, Fellow of the [employer] of Neu[employer].

## 2023-11-02 LAB — MICROALBUMIN / CREATININE URINE RATIO
Creatinine, Urine: 80 mg/dL (ref 20–320)
Microalb Creat Ratio: 48 mg/g{creat} — ABNORMAL HIGH (ref <30)
Microalb, Ur: 3.8 mg/dL

## 2023-11-02 LAB — BASIC METABOLIC PANEL WITH GFR
BUN: 16 mg/dL (ref 7–25)
CO2: 27 mmol/L (ref 20–32)
Calcium: 9.2 mg/dL (ref 8.6–10.3)
Chloride: 107 mmol/L (ref 98–110)
Creat: 1.28 mg/dL (ref 0.70–1.28)
Glucose, Bld: 167 mg/dL — ABNORMAL HIGH (ref 65–99)
Potassium: 4.6 mmol/L (ref 3.5–5.3)
Sodium: 142 mmol/L (ref 135–146)
eGFR: 59 mL/min/1.73m2 — ABNORMAL LOW (ref >=60)

## 2023-11-02 LAB — HEMOGLOBIN A1C
Hgb A1c MFr Bld: 8.6 % — ABNORMAL HIGH (ref <5.7)
Mean Plasma Glucose: 200 mg/dL
eAG (mmol/L): 11.1 mmol/L

## 2023-11-02 NOTE — Telephone Encounter (Signed)
 Pt returned call and I informed him of his HST results being negative for sleep apnea and that his fatigue, per Dr Chalice, may be related to Tegretol  s/e. Pt is to f/u in a year 10/10/24 at 1030 am. He will call us  sooner if needed however. He was very Adult nurse.

## 2023-11-03 ENCOUNTER — Ambulatory Visit: Admitting: Endocrinology

## 2023-11-03 ENCOUNTER — Encounter: Payer: Self-pay | Admitting: Endocrinology

## 2023-11-03 VITALS — BP 132/72 | HR 66 | Resp 16 | Ht 72.0 in | Wt 219.2 lb

## 2023-11-03 DIAGNOSIS — E1165 Type 2 diabetes mellitus with hyperglycemia: Secondary | ICD-10-CM | POA: Diagnosis not present

## 2023-11-03 DIAGNOSIS — Z7984 Long term (current) use of oral hypoglycemic drugs: Secondary | ICD-10-CM | POA: Diagnosis not present

## 2023-11-03 MED ORDER — RYBELSUS 3 MG PO TABS: 3.0000 mg | ORAL_TABLET | Freq: Every day | ORAL | 0 refills | Status: AC

## 2023-11-03 MED ORDER — RYBELSUS 7 MG PO TABS: 7.0000 mg | ORAL_TABLET | Freq: Every day | ORAL | 3 refills | Status: AC
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
## 2023-11-03 NOTE — Patient Instructions (Addendum)
 Good to see you today Flu shot today Recommend covid booster this fall Recommend shingles vaccine and tetanus shot at your pharmacy  If you continue to not like the oral Rybelsus  we can consider changing you over to Ozempic shots- let Dr Mercie know  We might try capsaicin cream for your chronic scar pain- however if you have breakdown of the skin stop use.  I would actually recommend wearing gloves for application instead of just washing your hands after   To use Capsacian cream, apply a thin layer to the affected area, rub it in gently, and wash your hands thoroughly afterward. Avoid contact with eyes, broken or irritated skin, and mucous membranes. Use the cream as directed, typically 4 times a day, and do not apply a bandage tightly over it.  Application instructions Apply a thin layer: Apply a small amount of the cream to cover the painful area with a thin layer.  Rub in gently: Rub the cream in gently until there is no excess visible.  Wash hands immediately: After application, wash your hands thoroughly with soap and water to avoid transferring the cream to other areas.    Important precautions Avoid certain areas: Do not apply to broken, cut, infected, or irritated skin.  Keep away from eyes and mucous membranes: Do not get the cream in your eyes or on lips.  Do not cover tightly: If using a bandage on the treated area, do not wrap it tightly.   Temporary burning: You may experience a temporary burning sensation, which is a common side effect.  Redness: Local redness is also a possible side effect.  Wait for results: It may take up to two weeks of regular use to see full pain relief.

## 2023-11-03 NOTE — Patient Instructions (Signed)
 Continue farxiga  10 mg daily.  Start rybelsus  3 mg daily for 30 daily and increase to 7 mg daily.

## 2023-11-06 ENCOUNTER — Other Ambulatory Visit: Payer: Self-pay | Admitting: Family Medicine

## 2023-11-06 ENCOUNTER — Ambulatory Visit: Admitting: Endocrinology

## 2023-11-08 ENCOUNTER — Encounter: Payer: Self-pay | Admitting: Family Medicine

## 2023-11-08 ENCOUNTER — Other Ambulatory Visit (HOSPITAL_BASED_OUTPATIENT_CLINIC_OR_DEPARTMENT_OTHER): Payer: Self-pay

## 2023-11-08 ENCOUNTER — Ambulatory Visit: Admitting: Family Medicine

## 2023-11-08 VITALS — BP 136/74 | HR 63 | Ht 72.0 in | Wt 219.0 lb

## 2023-11-08 DIAGNOSIS — I2510 Atherosclerotic heart disease of native coronary artery without angina pectoris: Secondary | ICD-10-CM

## 2023-11-08 DIAGNOSIS — I10 Essential (primary) hypertension: Secondary | ICD-10-CM | POA: Diagnosis not present

## 2023-11-08 DIAGNOSIS — Z23 Encounter for immunization: Secondary | ICD-10-CM

## 2023-11-08 DIAGNOSIS — E785 Hyperlipidemia, unspecified: Secondary | ICD-10-CM

## 2023-11-08 DIAGNOSIS — Z7984 Long term (current) use of oral hypoglycemic drugs: Secondary | ICD-10-CM | POA: Diagnosis not present

## 2023-11-08 DIAGNOSIS — L905 Scar conditions and fibrosis of skin: Secondary | ICD-10-CM

## 2023-11-08 DIAGNOSIS — E1151 Type 2 diabetes mellitus with diabetic peripheral angiopathy without gangrene: Secondary | ICD-10-CM

## 2023-11-08 MED ORDER — COMIRNATY 30 MCG/0.3ML IM SUSY
0.3000 mL | PREFILLED_SYRINGE | Freq: Once | INTRAMUSCULAR | 0 refills | Status: AC
  Filled 2023-11-08: qty 0.3, 1d supply, fill #0

## 2023-11-08 NOTE — Addendum Note (Signed)
 Addended by: ORVIN HARLENE HERO on: 11/08/2023 11:36 AM   Modules accepted: Orders

## 2023-11-09 ENCOUNTER — Ambulatory Visit: Admitting: Family Medicine

## 2023-11-15 ENCOUNTER — Other Ambulatory Visit: Payer: Self-pay | Admitting: Internal Medicine

## 2023-11-17 ENCOUNTER — Other Ambulatory Visit: Payer: Self-pay | Admitting: Internal Medicine

## 2023-12-12 ENCOUNTER — Other Ambulatory Visit: Payer: Self-pay | Admitting: Family Medicine

## 2023-12-12 DIAGNOSIS — J309 Allergic rhinitis, unspecified: Secondary | ICD-10-CM

## 2023-12-13 ENCOUNTER — Other Ambulatory Visit: Payer: Self-pay

## 2023-12-13 DIAGNOSIS — E1165 Type 2 diabetes mellitus with hyperglycemia: Secondary | ICD-10-CM

## 2023-12-13 MED ORDER — DAPAGLIFLOZIN PROPANEDIOL 10 MG PO TABS: 10.0000 mg | ORAL_TABLET | Freq: Every day | ORAL | 3 refills | Status: AC

## 2023-12-15 ENCOUNTER — Other Ambulatory Visit: Payer: Self-pay | Admitting: Internal Medicine

## 2023-12-25 ENCOUNTER — Other Ambulatory Visit: Payer: Self-pay

## 2023-12-25 MED ORDER — CARBAMAZEPINE 200 MG PO TABS: 200.0000 mg | ORAL_TABLET | Freq: Every day | ORAL | 3 refills | Status: AC

## 2023-12-25 NOTE — Telephone Encounter (Signed)
 Last refilled by patient on : 09/25/23 Last office visit : 10/11/23 Next office visit : 10/10/24 Per last office visit -  He remains on carbamazepine  200 mg daily
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
## 2023-12-26 ENCOUNTER — Encounter: Payer: Self-pay | Admitting: Physician Assistant

## 2023-12-26 ENCOUNTER — Ambulatory Visit: Admitting: Physician Assistant

## 2023-12-26 VITALS — BP 144/64 | HR 62 | Resp 16 | Ht 72.0 in | Wt 216.0 lb

## 2023-12-26 DIAGNOSIS — I10 Essential (primary) hypertension: Secondary | ICD-10-CM | POA: Diagnosis present

## 2023-12-26 DIAGNOSIS — I2510 Atherosclerotic heart disease of native coronary artery without angina pectoris: Secondary | ICD-10-CM | POA: Insufficient documentation

## 2023-12-26 DIAGNOSIS — E785 Hyperlipidemia, unspecified: Secondary | ICD-10-CM | POA: Diagnosis present

## 2023-12-26 DIAGNOSIS — L905 Scar conditions and fibrosis of skin: Secondary | ICD-10-CM

## 2023-12-26 DIAGNOSIS — Z951 Presence of aortocoronary bypass graft: Secondary | ICD-10-CM | POA: Insufficient documentation

## 2023-12-26 LAB — CBC
Hematocrit: 42.4 % (ref 37.5–51.0)
Hemoglobin: 13.5 g/dL (ref 13.0–17.7)
MCH: 28.4 pg (ref 26.6–33.0)
MCHC: 31.8 g/dL (ref 31.5–35.7)
MCV: 89 fL (ref 79–97)
Platelets: 176 x10E3/uL (ref 150–450)
RBC: 4.76 x10E6/uL (ref 4.14–5.80)
RDW: 12.4 % (ref 11.6–15.4)
WBC: 5.4 x10E3/uL (ref 3.4–10.8)

## 2023-12-26 MED ORDER — CARVEDILOL 6.25 MG PO TABS: 6.2500 mg | ORAL_TABLET | Freq: Two times a day (BID) | ORAL | 3 refills | Status: AC

## 2023-12-26 NOTE — Assessment & Plan Note (Signed)
 EKG: NSR, no significant change from prior studies, nonspecific ST and T wave abnormality, 61 bpm. Doing well today, no significant cardiac symptoms. Reports walking 1 mile a day, performing exercises given to him by cardiac rehab regularly. Continue carvedilol  6.25 mg twice daily Continue Plavix  75 mg daily Continue Crestor  40 mg daily Labs 10/11/2023: Hgb 12.9, platelets 140 (decreased from previous), potassium 4.6, BUN 16, creatinine 1.28 Will order CBC for further monitoring

## 2023-12-26 NOTE — Assessment & Plan Note (Signed)
 BP is well-controlled at home Ranging from 110s-low 140s systolic over 60-80 diastolic Elevated initially because patient has not taken his Coreg  prior to appointment Continue Coreg  6.25 mg twice daily Continue clonidine  0.3 mg/24-hour patch once weekly

## 2023-12-26 NOTE — Assessment & Plan Note (Signed)
 Lipid panel 05/10/2023: Cholesterol 134, HDL, 54, LDL 60, triglycerides 96 Repeat fasting lipid panel Continue Crestor  40 mg daily Consider addition of Zetia 10 mg daily if LDL still not at goal

## 2023-12-26 NOTE — Patient Instructions (Signed)
 Medication Instructions:  Your physician recommends that you continue on your current medications as directed. Please refer to the Current Medication list given to you today.  *If you need a refill on your cardiac medications before your next appointment, please call your pharmacy*  Lab Work: CBC, LIPIDS-TODAY If you have labs (blood work) drawn today and your tests are completely normal, you will receive your results only by: MyChart Message (if you have MyChart) OR A paper copy in the mail If you have any lab test that is abnormal or we need to change your treatment, we will call you to review the results.  Follow-Up: At [facility], you and your health needs are our priority.  As part of our continuing mission to provide you with exceptional heart care, our providers are all part of one team.  This team includes your primary Cardiologist (physician) and Advanced Practice Providers or APPs (Physician Assistants and Nurse Practitioners) who all work together to provide you with the care you need, when you need it.  Your next appointment:   6 month(s)  Provider:   Vina Gull, MD or Miriam Shams, NP or Glendia Ferrier, PA-C         Other Instructions: 1.Follow up with your primary care provider or dermatology regarding scar tenderness. 2.Keep a log of your blood pressure at home and call and let us  know if you have consistent readings greater than 130/80.

## 2023-12-27 ENCOUNTER — Other Ambulatory Visit: Payer: Self-pay

## 2023-12-27 ENCOUNTER — Ambulatory Visit: Payer: Self-pay | Admitting: Emergency Medicine

## 2023-12-27 ENCOUNTER — Telehealth: Payer: Self-pay | Admitting: Emergency Medicine

## 2023-12-27 LAB — LIPID PANEL
Chol/HDL Ratio: 2.3 ratio (ref 0.0–5.0)
Cholesterol, Total: 134 mg/dL (ref 100–199)
HDL: 59 mg/dL (ref >39)
LDL Chol Calc (NIH): 59 mg/dL (ref 0–99)
Triglycerides: 82 mg/dL (ref 0–149)
VLDL Cholesterol Cal: 16 mg/dL (ref 5–40)

## 2023-12-27 MED ORDER — CARBAMAZEPINE 200 MG PO TABS: 200.0000 mg | ORAL_TABLET | Freq: Every day | ORAL | 3 refills | Status: AC

## 2023-12-27 NOTE — Telephone Encounter (Signed)
 Pt calling asking if a nurse can call and give him his results

## 2023-12-27 NOTE — Telephone Encounter (Signed)
 Patient states he has been advised to not look at phone or computer screens to prevent seizures. He received a notification that his lab results were available and was calling to see how everything looked.  Informed patient Kenzie has not had a chance to review them yet, but we will follow-up with him when she does. Patient verbalized understanding and expressed appreciation.

## 2023-12-28 ENCOUNTER — Telehealth: Payer: Self-pay

## 2023-12-28 NOTE — Telephone Encounter (Signed)
 Copied from CRM #[id number]. Topic: Clinical - Medication Question >> Dec 28, 2023 12:25 PM Andrew [name] ORN wrote: Reason for CRM: Pt called to f/u on medication that cardiologist Okey wants him to start on . He stated that he would like to get advise from his pcp prior to taking for cholesterol pt unsure of name of medication at this time. Please contact him back to advise.

## 2023-12-29 ENCOUNTER — Other Ambulatory Visit: Payer: Self-pay | Admitting: Family Medicine

## 2023-12-29 ENCOUNTER — Telehealth: Payer: Self-pay

## 2023-12-29 NOTE — Telephone Encounter (Signed)
 LMOM asking for call back. Okay to relay.

## 2023-12-29 NOTE — Telephone Encounter (Signed)
 See other telephone note.

## 2023-12-29 NOTE — Telephone Encounter (Signed)
 Copied from CRM #[id number]. Topic: Clinical - Medication Question >> Dec 28, 2023 12:25 PM [name] ORN wrote: Reason for CRM: Pt called to f/u on medication that cardiologist Okey wants him to start on . He stated that he would like to get advise from his pcp prior to taking for cholesterol pt unsure of name of medication at this time. Please contact him back to advise. >> Dec 29, 2023 10:47 AM Rosina BIRCH wrote: Patient returning a call to [name] and I did read the message to him [phone]

## 2023-12-29 NOTE — Telephone Encounter (Signed)
 Patient called requesting name of cholesterol medication Kenzie, NP had recommended to give to his PCP. Informed him it was Zetia 10 mg. No further needs at this time and is updating his PCP.

## 2023-12-29 NOTE — Telephone Encounter (Signed)
Patient is calling back for results. Please advise ?

## 2024-02-01 LAB — OPHTHALMOLOGY REPORT-SCANNED

## 2024-02-05 ENCOUNTER — Ambulatory Visit: Admitting: Endocrinology

## 2024-02-08 ENCOUNTER — Ambulatory Visit: Payer: Medicare Other

## 2024-02-08 VITALS — Ht 72.0 in | Wt 209.0 lb

## 2024-02-08 DIAGNOSIS — Z0000 Encounter for general adult medical examination without abnormal findings: Secondary | ICD-10-CM | POA: Diagnosis not present

## 2024-02-08 NOTE — Patient Instructions (Addendum)
 Mr. Carrington,  Thank you for taking the time for your Medicare Wellness Visit. I appreciate your continued commitment to your health goals. Please review the care plan we discussed, and feel free to reach out if I can assist you further.  Please note that Annual Wellness Visits do not include a physical exam. Some assessments may be limited, especially if the visit was conducted virtually. If needed, we may recommend an in-person follow-up with your provider.  Ongoing Care Seeing your primary care provider every 3 to 6 months helps us  monitor your health and provide consistent, personalized care.   Referrals If a referral was made during today's visit and you haven't received any updates within two weeks, please contact the referred provider directly to check on the status.  Recommended Screenings:  Health Maintenance  Topic Date Due   Zoster (Shingles) Vaccine (2 of 2) 06/22/2016   DTaP/Tdap/Td vaccine (2 - Td or Tdap) 03/06/2022   Complete foot exam   04/03/2024   Kidney health urinalysis for diabetes  05/01/2024   Hemoglobin A1C  05/01/2024   COVID-19 Vaccine (8 - 2025-26 season) 05/08/2024   Yearly kidney function blood test for diabetes  10/31/2024   Eye exam for diabetics  01/31/2025   Medicare Annual Wellness Visit  02/07/2025   Colon Cancer Screening  11/21/2026   Pneumococcal Vaccine for age over 64  Completed   Flu Shot  Completed   Hepatitis C Screening  Completed   Meningitis B Vaccine  Aged Out       02/08/2024    2:59 PM  Advanced Directives  Does Patient Have a Medical Advance Directive? No  Would patient like information on creating a medical advance directive? No - Patient declined    Vision: Annual vision screenings are recommended for early detection of glaucoma, cataracts, and diabetic retinopathy. These exams can also reveal signs of chronic conditions such as diabetes and high blood pressure.  Dental: Annual dental screenings help detect early signs of  oral cancer, gum disease, and other conditions linked to overall health, including heart disease and diabetes.  Please see the attached documents for additional preventive care recommendations.
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
## 2024-02-13 ENCOUNTER — Other Ambulatory Visit: Payer: Self-pay | Admitting: Family Medicine

## 2024-04-01 ENCOUNTER — Ambulatory Visit: Admitting: Endocrinology

## 2024-05-08 ENCOUNTER — Ambulatory Visit: Admitting: Family Medicine

## 2024-10-10 ENCOUNTER — Ambulatory Visit: Admitting: Neurology

## 2025-02-13 ENCOUNTER — Ambulatory Visit
